# Patient Record
Sex: Male | Born: 1945
Health system: Southern US, Community
[De-identification: ages and names within clinical notes are randomized; demographics above are authoritative.]

## PROBLEM LIST (undated history)

## (undated) DIAGNOSIS — E785 Hyperlipidemia, unspecified: Secondary | ICD-10-CM

## (undated) DIAGNOSIS — Z8619 Personal history of other infectious and parasitic diseases: Secondary | ICD-10-CM

## (undated) DIAGNOSIS — I10 Essential (primary) hypertension: Secondary | ICD-10-CM

## (undated) HISTORY — PX: OTHER SURGICAL HISTORY: SHX169

## (undated) HISTORY — DX: Essential (primary) hypertension: I10

## (undated) HISTORY — DX: Hyperlipidemia, unspecified: E78.5

## (undated) HISTORY — DX: Personal history of other infectious and parasitic diseases: Z86.19

---

## 1993-05-21 HISTORY — PX: ANGIOPLASTY: SHX39

## 2005-02-19 HISTORY — PX: CATARACT EXTRACTION: SUR2

## 2013-06-15 LAB — BASIC METABOLIC PANEL
BUN: 15 mg/dL (ref 4–21)
Creatinine: 1.2 mg/dL (ref 0.6–1.3)
Glucose: 93 mg/dL
Potassium: 4.9 mmol/L (ref 3.4–5.3)
Sodium: 141 mmol/L (ref 137–147)

## 2013-06-15 LAB — LIPID PANEL
CHOLESTEROL: 160 mg/dL (ref 0–200)
HDL: 35 mg/dL (ref 35–70)
LDL CALC: 100 mg/dL
Triglycerides: 125 mg/dL (ref 40–160)

## 2013-06-15 LAB — PSA: PSA: 0.7

## 2014-09-16 DIAGNOSIS — I10 Essential (primary) hypertension: Secondary | ICD-10-CM | POA: Insufficient documentation

## 2014-09-16 DIAGNOSIS — I2510 Atherosclerotic heart disease of native coronary artery without angina pectoris: Secondary | ICD-10-CM | POA: Insufficient documentation

## 2014-09-16 DIAGNOSIS — E781 Pure hyperglyceridemia: Secondary | ICD-10-CM | POA: Insufficient documentation

## 2014-09-16 DIAGNOSIS — Z87891 Personal history of nicotine dependence: Secondary | ICD-10-CM | POA: Insufficient documentation

## 2014-09-16 DIAGNOSIS — R12 Heartburn: Secondary | ICD-10-CM | POA: Insufficient documentation

## 2014-09-17 ENCOUNTER — Encounter: Payer: Self-pay | Admitting: Family Medicine

## 2014-09-17 ENCOUNTER — Ambulatory Visit (INDEPENDENT_AMBULATORY_CARE_PROVIDER_SITE_OTHER): Payer: Medicare Other | Admitting: Family Medicine

## 2014-09-17 VITALS — BP 120/80 | HR 72 | Temp 99.1°F | Resp 16 | Ht 65.5 in | Wt 191.0 lb

## 2014-09-17 DIAGNOSIS — Z87891 Personal history of nicotine dependence: Secondary | ICD-10-CM

## 2014-09-17 DIAGNOSIS — E781 Pure hyperglyceridemia: Secondary | ICD-10-CM

## 2014-09-17 DIAGNOSIS — Z125 Encounter for screening for malignant neoplasm of prostate: Secondary | ICD-10-CM | POA: Diagnosis not present

## 2014-09-17 DIAGNOSIS — Z0000 Encounter for general adult medical examination without abnormal findings: Secondary | ICD-10-CM | POA: Diagnosis not present

## 2014-09-17 DIAGNOSIS — Z1211 Encounter for screening for malignant neoplasm of colon: Secondary | ICD-10-CM | POA: Diagnosis not present

## 2014-09-17 LAB — IFOBT (OCCULT BLOOD): IFOBT: NEGATIVE

## 2014-09-17 NOTE — Progress Notes (Signed)
Patient: Drew Russell, Male    DOB: October 23, 1945, 69 y.o.   MRN: [id number] Visit Date: 09/19/2014  Today's Provider: Mila Merry, MD   Chief Complaint  Patient presents with  . Annual Exam  . Hypertension   Subjective:    Annual wellness visit Drew Russell is a 69 y.o. male who presents today for his Subsequent Annual Wellness Visit. He feels well. He reports exercising yes. He reports he is sleeping well.  -----------------------------------------------------------  Hypertension, follow-up:  BP Readings from Last 3 Encounters:  09/17/14 120/80  06/12/13 156/70    He was last seen for hypertension 3 months ago.  BP at that visit was 156/70. Management changes since that visit include recommended decreasing sodium in diet and exersing 30 minutes 3 days a week. He reports good compliance with treatment. He is not having side effects. none  He is not exercising. He is not adherent to low salt diet.   Outside blood pressures are N/A. He is experiencing none.  Patient denies none.   Use of agents associated with hypertension: none.     Weight trend: increasing steadily Wt Readings from Last 3 Encounters:  09/17/14 191 lb (86.637 kg)  06/12/13 191 lb (86.637 kg)    Current diet: in general, an "unhealthy" diet  ------------------------------------------------------------------------  Follow-up for hypertriglyceridemia from 06/12/2013: recommended take OTC fish oil 4,000 mg qd and also take coated aspirin 81 mg qd.  Review of Systems  Constitutional: Negative.   HENT: Positive for tinnitus.   Eyes: Negative.   Respiratory: Negative.   Cardiovascular: Negative.   Gastrointestinal: Positive for constipation.  Endocrine: Negative.   Genitourinary: Negative.   Musculoskeletal: Negative.   Skin: Negative.   Allergic/Immunologic: Negative.   Neurological: Negative.   Hematological: Negative.   Psychiatric/Behavioral: Negative.     History   Social  History  . Marital Status: Single    Spouse Name: N/A  . Number of Children: N/A  . Years of Education: 8th grade   Occupational History  . Retired    Social History Main Topics  . Smoking status: Former Smoker -- 1.00 packs/day for 20 years    Types: Cigarettes  . Smokeless tobacco: Former Neurosurgeon    Quit date: 01/01/2014  . Alcohol Use: No     Comment: Previous heavy drinker  . Drug Use: No  . Sexual Activity: Not on file   Other Topics Concern  . Not on file   Social History Narrative    Patient Active Problem List   Diagnosis Date Noted  . Coronary artery disease 09/16/2014  . Essential (primary) hypertension 09/16/2014  . Heartburn 09/16/2014  . Hypertriglyceridemia 09/16/2014  . History of tobacco abuse 09/16/2014    Past Surgical History  Procedure Laterality Date  . Cataract extraction Bilateral 2007  . Angioplasty  05/21/1993    [facility]  . Cardiac catherterization      per pt had angioplast in 1995, no stent    His family history includes Colon cancer in his sister; Diabetes in his sister and sister; Heart attack in his father; Heart disease in his father; Stroke in his mother.    Previous Medications   OMEGA-3 FATTY ACIDS (FISH OIL) 1000 MG CAPS    Take 2 tablets by mouth daily.    Patient Care Team: Malva Limes, MD as PCP - General (Family Medicine)     Objective:   Vitals: BP 120/80 mmHg  Pulse 72  Temp(Src) 99.1 F (37.3 C) (Oral)  Resp 16  Ht 5' 5.5" (1.664 m)  Wt 191 lb (86.637 kg)  BMI 31.29 kg/m2  SpO2 97%  Physical Exam   General Appearance:    Alert, cooperative, no distress, appears stated age, obese  Head:    Normocephalic, without obvious abnormality, atraumatic  Eyes:    PERRL, conjunctiva/corneas clear, EOM's intact, fundi    benign, both eyes       Ears:    Normal TM's and external ear canals, both ears  Nose:   Nares normal, septum midline, mucosa normal, no drainage   or sinus tenderness    Throat:   Lips, mucosa, and tongue normal; teeth and gums normal  Neck:   Supple, symmetrical, trachea midline, no adenopathy;       thyroid:  No enlargement/tenderness/nodules; no carotid   bruit or JVD  Back:     Symmetric, no curvature, ROM normal, no CVA tenderness  Lungs:     Clear to auscultation bilaterally, respirations unlabored  Chest wall:    No tenderness or deformity  Heart:    Regular rate and rhythm, S1 and S2 normal, no murmur, rub   or gallop  Abdomen:     Soft, non-tender, bowel sounds active all four quadrants,    no masses, no organomegaly  Genitalia:    deferred  Rectal:    normal tone, normal prostate, no masses or tenderness and soft brown guaiac negative stool noted  Extremities:   Extremities normal, atraumatic, no cyanosis or edema  Pulses:   2+ and symmetric all extremities  Skin:   Skin color, texture, turgor normal, no rashes or lesions  Lymph nodes:   Cervical, supraclavicular, and axillary nodes normal  Neurologic:   CNII-XII intact. Normal strength, sensation and reflexes      throughout    Activities of Daily Living In your present state of health, do you have any difficulty performing the following activities: 09/17/2014  Hearing? Y  Vision? N  Difficulty concentrating or making decisions? N  Walking or climbing stairs? N  Dressing or bathing? N  Doing errands, shopping? Y    Fall Risk Assessment Fall Risk  09/17/2014  Falls in the past year? No     Depression Screen PHQ 2/9 Scores 09/17/2014  PHQ - 2 Score 0    Cognitive Testing - 6-CIT  Correct? Score   What year is it? yes 0 0 or 4  What month is it? yes 0 0 or 3  Memorize:    Floyde Parkins,  40,  [address],  [place],      What time is it? (within 1 hour) yes 0 0 or 3  Count backwards from 20 yes 0 0, 2, or 4  Name the months of the year yes 0 0, 2, or 4  Repeat name & address above yes 4 0, 2, 4, 6, 8, or 10       TOTAL SCORE  4/28   Interpretation:  Normal  Normal (0-7) Abnormal  (8-28)       Assessment & Plan:     Annual Wellness Visit  Reviewed patient's Family Medical History Reviewed and updated list of patient's medical providers Assessment of cognitive impairment was done Assessed patient's functional ability Established a written schedule for health screening services Health Risk Assessent Completed and Reviewed  Exercise Activities and Dietary recommendations Goals    None      Immunization History  Administered Date(s) Administered  .  Pneumococcal Conjugate-13 06/12/2013  . Pneumococcal Polysaccharide-23 04/09/2012    Health Maintenance  Topic Date Due  . COLONOSCOPY  10/28/1993  . ZOSTAVAX  10/29/2003  . TETANUS/TDAP  09/17/2015 (Originally 10/29/1962)  . INFLUENZA VACCINE  09/20/2014  . PNA vac Low Risk Adult  Completed      Discussed health benefits of physical activity, and encouraged him to engage in regular exercise appropriate for his age and condition.    ------------------------------------------------------------------------------------------------------------ 1. History of tobacco abuse Congratulated on smoking cessation.   2. Prostate cancer screening  - PSA  3. Colon cancer screening  - IFOBT POC (occult bld, rslt in office)  4. Annual physical exam   5. Hypertriglyceridemia  - Lipid panel - Glucose

## 2014-09-21 LAB — LIPID PANEL
Chol/HDL Ratio: 4.5 ratio units (ref 0.0–5.0)
Cholesterol, Total: 140 mg/dL (ref 100–199)
HDL: 31 mg/dL — AB (ref >39)
LDL Calculated: 87 mg/dL (ref 0–99)
Triglycerides: 110 mg/dL (ref 0–149)
VLDL Cholesterol Cal: 22 mg/dL (ref 5–40)

## 2014-09-21 LAB — PSA: Prostate Specific Ag, Serum: 0.6 ng/mL (ref 0.0–4.0)

## 2014-09-21 LAB — GLUCOSE, RANDOM: Glucose: 93 mg/dL (ref 65–99)

## 2015-12-08 ENCOUNTER — Telehealth: Payer: Self-pay | Admitting: Family Medicine

## 2015-12-08 NOTE — Telephone Encounter (Signed)
Called Pt to schedule AWV with [facility] for Nov - knb °

## 2015-12-08 NOTE — Telephone Encounter (Signed)
Pt returned call. I advised about scheduling AWV. Pt stated that he has a lot going on right now and would rather be called back after the first of the year to schedule the visit. Thanks TNP

## 2016-04-18 ENCOUNTER — Ambulatory Visit (INDEPENDENT_AMBULATORY_CARE_PROVIDER_SITE_OTHER): Payer: Medicare Other | Admitting: Family Medicine

## 2016-04-18 ENCOUNTER — Ambulatory Visit (INDEPENDENT_AMBULATORY_CARE_PROVIDER_SITE_OTHER): Payer: Medicare Other

## 2016-04-18 ENCOUNTER — Encounter: Payer: Self-pay | Admitting: Family Medicine

## 2016-04-18 VITALS — BP 150/70 | HR 72 | Temp 98.7°F | Ht 66.0 in | Wt 194.0 lb

## 2016-04-18 VITALS — BP 140/86

## 2016-04-18 DIAGNOSIS — I10 Essential (primary) hypertension: Secondary | ICD-10-CM | POA: Diagnosis not present

## 2016-04-18 DIAGNOSIS — Z1159 Encounter for screening for other viral diseases: Secondary | ICD-10-CM

## 2016-04-18 DIAGNOSIS — Z0000 Encounter for general adult medical examination without abnormal findings: Secondary | ICD-10-CM

## 2016-04-18 DIAGNOSIS — Z125 Encounter for screening for malignant neoplasm of prostate: Secondary | ICD-10-CM

## 2016-04-18 DIAGNOSIS — Z23 Encounter for immunization: Secondary | ICD-10-CM

## 2016-04-18 DIAGNOSIS — Z87891 Personal history of nicotine dependence: Secondary | ICD-10-CM | POA: Diagnosis not present

## 2016-04-18 DIAGNOSIS — Z1211 Encounter for screening for malignant neoplasm of colon: Secondary | ICD-10-CM

## 2016-04-18 NOTE — Patient Instructions (Signed)
   Recommend taking 81mg enteric coated aspirin to reduce risk of vascular events such as heart attacks and strokes.    

## 2016-04-18 NOTE — Progress Notes (Signed)
Patient: Drew Russell, Male    DOB: 08/30/1945, 71 y.o.   MRN: [id number] Visit Date: 04/18/2016  Today's Provider: Mila Merryonald Arne Schlender, MD   Chief Complaint  Patient presents with  . Annual Exam  . Hypertension    follow up  . Hyperlipidemia    follow up   Subjective:    Annual physical exam Drew IshikawaHoward D Russell is a 71 y.o. male who presents today for health maintenance and complete physical. He feels well. He reports never exercising. He reports he is sleeping fairly well.  -----------------------------------------------------------------  Hypertension, follow-up:  BP Readings from Last 3 Encounters:  04/18/16 (!) 150/70  09/17/14 120/80  06/12/13 (!) 156/70    He was last seen for hypertension 2 years ago.  BP at that visit was 156/70. Management since that visit includes advising patient to decrease sodium in his diet and exercise regularly. He reports good compliance with treatment. He is not having side effects.  He is not exercising. He is not adherent to low salt diet.   Outside blood pressures are not being checked. He is experiencing none.  Patient denies chest pain, chest pressure/discomfort, claudication, dyspnea, exertional chest pressure/discomfort, fatigue, irregular heart beat, lower extremity edema, near-syncope, orthopnea, palpitations, paroxysmal nocturnal dyspnea, syncope and tachypnea.   Cardiovascular risk factors include advanced age (older than 4855 for men, 3065 for women), dyslipidemia, hypertension and male gender.  Use of agents associated with hypertension: none.     Weight trend: fluctuating a bit Wt Readings from Last 3 Encounters:  04/18/16 194 lb (88 kg)  09/17/14 191 lb (86.6 kg)  06/12/13 191 lb (86.6 kg)    Current diet: in general, an "unhealthy" diet  ------------------------------------------------------------------------  Lipid/Cholesterol, Follow-up:   Last seen for this 2 years ago.  Management changes since that visit  include no changes. . Last Lipid Panel:    Component Value Date/Time   CHOL 140 09/20/2014 1225   TRIG 110 09/20/2014 1225   HDL 31 (L) 09/20/2014 1225   CHOLHDL 4.5 09/20/2014 1225   LDLCALC 87 09/20/2014 1225    Risk factors for vascular disease include hypercholesterolemia and hypertension  He reports good compliance with treatment. He is not having side effects.  Current symptoms include none and have been stable. Weight trend: fluctuating a bit Prior visit with dietician: no Current diet: in general, an "unhealthy" diet Current exercise: none  Wt Readings from Last 3 Encounters:  04/18/16 194 lb (88 kg)  09/17/14 191 lb (86.6 kg)  06/12/13 191 lb (86.6 kg)    -------------------------------------------------------------------   Review of Systems  Constitutional: Negative for appetite change, chills, fatigue and fever.  HENT: Negative for congestion, ear pain, hearing loss, nosebleeds and trouble swallowing.   Eyes: Negative for pain and visual disturbance.  Respiratory: Negative for cough, chest tightness and shortness of breath.   Cardiovascular: Negative for chest pain, palpitations and leg swelling.  Gastrointestinal: Negative for abdominal pain, blood in stool, constipation, diarrhea, nausea and vomiting.  Endocrine: Negative for polydipsia, polyphagia and polyuria.  Genitourinary: Negative for dysuria and flank pain.  Musculoskeletal: Negative for arthralgias, back pain, joint swelling, myalgias and neck stiffness.  Skin: Negative for color change, rash and wound.  Neurological: Negative for dizziness, tremors, seizures, speech difficulty, weakness, light-headedness and headaches.  Psychiatric/Behavioral: Negative for behavioral problems, confusion, decreased concentration, dysphoric mood and sleep disturbance. The patient is not nervous/anxious.   All other systems reviewed and are negative.   Social History  He  reports that he quit smoking about 2  years ago. His smoking use included Cigarettes. He has a 20.00 pack-year smoking history. He has never used smokeless tobacco. He reports that he does not drink alcohol or use drugs.       Social History   Social History  . Marital status: Single    Spouse name: N/A  . Number of children: N/A  . Years of education: 8th grade   Occupational History  . Retired    Social History Main Topics  . Smoking status: Former Smoker    Packs/day: 1.00    Years: 20.00    Types: Cigarettes    Quit date: 01/01/2014  . Smokeless tobacco: Never Used  . Alcohol use No     Comment: Previous heavy drinker  . Drug use: No  . Sexual activity: Not on file   Other Topics Concern  . Not on file   Social History Narrative  . No narrative on file    Past Medical History:  Diagnosis Date  . History of chicken pox   . History of measles as a child   . History of mumps as a child      Patient Active Problem List   Diagnosis Date Noted  . Coronary artery disease 09/16/2014  . Essential (primary) hypertension 09/16/2014  . Heartburn 09/16/2014  . Hypertriglyceridemia 09/16/2014  . History of tobacco abuse 09/16/2014    Past Surgical History:  Procedure Laterality Date  . ANGIOPLASTY  05/21/1993   [facility]  . Cardiac Catherterization     per pt had angioplast in 1995, no stent  . CATARACT EXTRACTION Bilateral 2007    Family History        Family Status  Relation Status  . Mother Deceased at age 33   OLD AGE  . Father Deceased at age 28   MI  . Sister Alive  . Sister Alive        His family history includes Colon cancer in his sister; Diabetes in his sister and sister; Heart attack in his father; Heart disease in his father; Stroke in his mother.     No Known Allergies   Current Outpatient Prescriptions:  .  Omega-3 Fatty Acids (FISH OIL) 1000 MG CAPS, Take 2 tablets by mouth daily., Disp: , Rfl:    Patient Care Team: Malva Limes, MD as PCP -  General (Family Medicine)      Objective:    Vital Signs - Last Recorded  Most recent update: 04/18/2016 2:45 PM by Hyacinth Meeker, LPN  BP    161/09 (BP Location: Right Arm)     Pulse  72     Temp  98.7 F (37.1 C) (Oral)     Ht  5\' 6"  (1.676 m)     Wt  194 lb (88 kg)      BMI  31.31 kg/m        Physical Exam   General Appearance:    Alert, cooperative, no distress, appears stated age  Head:    Normocephalic, without obvious abnormality, atraumatic  Eyes:    PERRL, conjunctiva/corneas clear, EOM's intact, fundi    benign, both eyes       Ears:    Normal TM's and external ear canals, both ears  Nose:   Nares normal, septum midline, mucosa normal, no drainage   or sinus tenderness  Throat:   Lips, mucosa, and tongue normal; teeth and gums normal  Neck:  Supple, symmetrical, trachea midline, no adenopathy;       thyroid:  No enlargement/tenderness/nodules; no carotid   bruit or JVD  Back:     Symmetric, no curvature, ROM normal, no CVA tenderness  Lungs:     Clear to auscultation bilaterally, respirations unlabored  Chest wall:    No tenderness or deformity  Heart:    Regular rate and rhythm, S1 and S2 normal, no murmur, rub   or gallop  Abdomen:     Soft, non-tender, bowel sounds active all four quadrants,    no masses, no organomegaly  Genitalia:    deferred  Rectal:    deferred  Extremities:   Extremities normal, atraumatic, no cyanosis or edema  Pulses:   2+ and symmetric all extremities  Skin:   Skin color, texture, turgor normal, no rashes or lesions  Lymph nodes:   Cervical, supraclavicular, and axillary nodes normal  Neurologic:   CNII-XII intact. Normal strength, sensation and reflexes      throughout      Depression Screen PHQ 2/9 Scores 04/18/2016 04/18/2016 09/17/2014  PHQ - 2 Score 0 1 0  PHQ- 9 Score 0 - -      Assessment & Plan:     Routine Health Maintenance and Physical Exam  Exercise Activities and Dietary  recommendations Goals    . Increase water intake          Starting 04/18/16, I will increase my water intake to 4 glasses a day.       Immunization History  Administered Date(s) Administered  . Pneumococcal Conjugate-13 06/12/2013  . Pneumococcal Polysaccharide-23 04/09/2012    Health Maintenance  Topic Date Due  . Hepatitis C Screening  02/19/2017 (Originally March 17, 1945)  . COLONOSCOPY  02/19/2026 (Originally 10/28/1993)  . TETANUS/TDAP  02/19/2026 (Originally 10/29/1962)  . INFLUENZA VACCINE  Completed  . PNA vac Low Risk Adult  Completed     Discussed health benefits of physical activity, and encouraged him to engage in regular exercise appropriate for his age and condition.    --------------------------------------------------------------------  1. Annual physical exam Patient Instructions  Recommend taking 81mg  enteric coated aspirin to reduce risk of vascular events such as heart attacks and strokes.    2. Need for hepatitis C screening test  - Hepatitis C antibody  3. Colon cancer screening Will sent with iFOBT  4. Prostate cancer screening  - PSA  5. Essential (primary) hypertension Well controlled.  Continue current medications.   - Lipid panel - Comprehensive metabolic panel  6. History of smoking 30 or more pack years  - CT CHEST LUNG CA SCREEN LOW DOSE W/O CM; Future   Mila Merry, MD  [facility]

## 2016-04-18 NOTE — Patient Instructions (Signed)
 Health Maintenance, Male A healthy lifestyle and preventive care is important for your health and wellness. Ask your health care provider about what schedule of regular examinations is right for you. What should I know about weight and diet?  Eat a Healthy Diet  Eat plenty of vegetables, fruits, whole grains, low-fat dairy products, and lean protein.  Do not eat a lot of foods high in solid fats, added sugars, or salt. Maintain a Healthy Weight  Regular exercise can help you achieve or maintain a healthy weight. You should:  Do at least 150 minutes of exercise each week. The exercise should increase your heart rate and make you sweat (moderate-intensity exercise).  Do strength-training exercises at least twice a week. Watch Your Levels of Cholesterol and Blood Lipids  Have your blood tested for lipids and cholesterol every 5 years starting at 73 years of age. If you are at high risk for heart disease, you should start having your blood tested when you are 73 years old. You may need to have your cholesterol levels checked more often if:  Your lipid or cholesterol levels are high.  You are older than 73 years of age.  You are at high risk for heart disease. What should I know about cancer screening? Many types of cancers can be detected early and may often be prevented. Lung Cancer  You should be screened every year for lung cancer if:  You are a current smoker who has smoked for at least 30 years.  You are a former smoker who has quit within the past 15 years.  Talk to your health care provider about your screening options, when you should start screening, and how often you should be screened. Colorectal Cancer  Routine colorectal cancer screening usually begins at 73 years of age and should be repeated every 5-10 years until you are 73 years old. You may need to be screened more often if early forms of precancerous polyps or small growths are found. Your health care provider  may recommend screening at an earlier age if you have risk factors for colon cancer.  Your health care provider may recommend using home test kits to check for hidden blood in the stool.  A small camera at the end of a tube can be used to examine your colon (sigmoidoscopy or colonoscopy). This checks for the earliest forms of colorectal cancer. Prostate and Testicular Cancer  Depending on your age and overall health, your health care provider may do certain tests to screen for prostate and testicular cancer.  Talk to your health care provider about any symptoms or concerns you have about testicular or prostate cancer. Skin Cancer  Check your skin from head to toe regularly.  Tell your health care provider about any new moles or changes in moles, especially if:  There is a change in a mole's size, shape, or color.  You have a mole that is larger than a pencil eraser.  Always use sunscreen. Apply sunscreen liberally and repeat throughout the day.  Protect yourself by wearing long sleeves, pants, a wide-brimmed hat, and sunglasses when outside. What should I know about heart disease, diabetes, and high blood pressure?  If you are 18-39 years of age, have your blood pressure checked every 3-5 years. If you are 40 years of age or older, have your blood pressure checked every year. You should have your blood pressure measured twice-once when you are at a hospital or clinic, and once when you are not at   a hospital or clinic. Record the average of the two measurements. To check your blood pressure when you are not at a hospital or clinic, you can use:  An automated blood pressure machine at a pharmacy.  A home blood pressure monitor.  Talk to your health care provider about your target blood pressure.  If you are between 45-79 years old, ask your health care provider if you should take aspirin to prevent heart disease.  Have regular diabetes screenings by checking your fasting blood sugar  level.  If you are at a normal weight and have a low risk for diabetes, have this test once every three years after the age of 45.  If you are overweight and have a high risk for diabetes, consider being tested at a younger age or more often.  A one-time screening for abdominal aortic aneurysm (AAA) by ultrasound is recommended for men aged 65-75 years who are current or former smokers. What should I know about preventing infection? Hepatitis B  If you have a higher risk for hepatitis B, you should be screened for this virus. Talk with your health care provider to find out if you are at risk for hepatitis B infection. Hepatitis C  Blood testing is recommended for:  Everyone born from 1945 through 1965.  Anyone with known risk factors for hepatitis C. Sexually Transmitted Diseases (STDs)  You should be screened each year for STDs including gonorrhea and chlamydia if:  You are sexually active and are younger than 73 years of age.  You are older than 73 years of age and your health care provider tells you that you are at risk for this type of infection.  Your sexual activity has changed since you were last screened and you are at an increased risk for chlamydia or gonorrhea. Ask your health care provider if you are at risk.  Talk with your health care provider about whether you are at high risk of being infected with HIV. Your health care provider may recommend a prescription medicine to help prevent HIV infection. What else can I do?  Schedule regular health, dental, and eye exams.  Stay current with your vaccines (immunizations).  Do not use any tobacco products, such as cigarettes, chewing tobacco, and e-cigarettes. If you need help quitting, ask your health care provider.  Limit alcohol intake to no more than 2 drinks per day. One drink equals 12 ounces of beer, 5 ounces of wine, or 1 ounces of hard liquor.  Do not use street drugs.  Do not share needles.  Ask your health  care provider for help if you need support or information about quitting drugs.  Tell your health care provider if you often feel depressed.  Tell your health care provider if you have ever been abused or do not feel safe at home. This information is not intended to replace advice given to you by your health care provider. Make sure you discuss any questions you have with your health care provider. Document Released: 08/04/2007 Document Revised: 10/05/2015 Document Reviewed: 11/09/2014 Elsevier Interactive Patient Education  2017 [employer]  

## 2016-04-18 NOTE — Progress Notes (Signed)
Subjective:   Drew Russell is a 71 y.o. male who presents for Medicare Annual/Subsequent preventive examination.  Review of Systems:  N/A  Cardiac Risk Factors include: advanced age (>100men, >60 women);hypertension;male gender;obesity (BMI >30kg/m2)     Objective:    Vitals: BP (!) 150/70 (BP Location: Right Arm)   Pulse 72   Temp 98.7 F (37.1 C) (Oral)   Ht 5\' 6"  (1.676 m)   Wt 194 lb (88 kg)   BMI 31.31 kg/m   Body mass index is 31.31 kg/m.  Tobacco History  Smoking Status  . Former Smoker  . Packs/day: 1.00  . Years: 20.00  . Types: Cigarettes  . Quit date: 01/01/2014  Smokeless Tobacco  . Never Used     Counseling given: Not Answered   Past Medical History:  Diagnosis Date  . History of chicken pox   . History of measles as a child   . History of mumps as a child    Past Surgical History:  Procedure Laterality Date  . ANGIOPLASTY  05/21/1993   [facility]  . Cardiac Catherterization     per pt had angioplast in 1995, no stent  . CATARACT EXTRACTION Bilateral 2007   Family History  Problem Relation Age of Onset  . Stroke Mother   . Heart disease Father   . Heart attack Father   . Colon cancer Sister   . Diabetes Sister     type 2  . Diabetes Sister     type 2   History  Sexual Activity  . Sexual activity: Not on file    Outpatient Encounter Prescriptions as of 04/18/2016  Medication Sig  . Omega-3 Fatty Acids (FISH OIL) 1000 MG CAPS Take 2 tablets by mouth daily.   No facility-administered encounter medications on file as of 04/18/2016.     Activities of Daily Living In your present state of health, do you have any difficulty performing the following activities: 04/18/2016  Hearing? N  Vision? N  Difficulty concentrating or making decisions? N  Walking or climbing stairs? N  Dressing or bathing? N  Doing errands, shopping? Y  Preparing Food and eating ? N  Using the Toilet? N  In the past six months, have you  accidently leaked urine? N  Do you have problems with loss of bowel control? N  Managing your Medications? N  Managing your Finances? N  Housekeeping or managing your Housekeeping? N  Some recent data might be hidden    Patient Care Team: Malva Limes, MD as PCP - General (Family Medicine)   Assessment:     Exercise Activities and Dietary recommendations Current Exercise Habits: The patient does not participate in regular exercise at present, Exercise limited by: None identified  Goals    . Increase water intake          Starting 04/18/16, I will increase my water intake to 4 glasses a day.      Fall Risk Fall Risk  04/18/2016 09/17/2014  Falls in the past year? No No   Depression Screen PHQ 2/9 Scores 04/18/2016 09/17/2014  PHQ - 2 Score 1 0    Cognitive Function     6CIT Screen 04/18/2016  What Year? 0 points  What month? 0 points  What time? 0 points  Count back from 20 0 points  Months in reverse 0 points  Repeat phrase 6 points  Total Score 6    Immunization History  Administered Date(s) Administered  .  Influenza, High Dose Seasonal PF 04/18/2016  . Pneumococcal Conjugate-13 06/12/2013  . Pneumococcal Polysaccharide-23 04/09/2012   Screening Tests Health Maintenance  Topic Date Due  . Hepatitis C Screening  02/19/2017 (Originally 03/19/1945)  . COLONOSCOPY  02/19/2026 (Originally 10/28/1993)  . TETANUS/TDAP  02/19/2026 (Originally [birth year]29/1964)  . INFLUENZA VACCINE  Completed  . PNA vac Low Risk Adult  Completed      Plan:  I have personally reviewed and addressed the Medicare Annual Wellness questionnaire and have noted the following in the patient's chart:  A. Medical and social history B. Use of alcohol, tobacco or illicit drugs  C. Current medications and supplements D. Functional ability and status E.  Nutritional status F.  Physical activity G. Advance directives H. List of other physicians I.  Hospitalizations, surgeries, and ER visits in  previous 12 months J.  Vitals K. Screenings such as hearing and vision if needed, cognitive and depression L. Referrals and appointments - none  In addition, I have reviewed and discussed with patient certain preventive protocols, quality metrics, and best practice recommendations. A written personalized care plan for preventive services as well as general preventive health recommendations were provided to patient.  See attached scanned questionnaire for additional information.   Signed,  Hyacinth MeekerMckenzie Aydden Cumpian, LPN Nurse Health Advisor   MD Recommendations: None. Pt declined Hepatitis C screening, tetanus vaccine and colonoscopy.   I have reviewed the health advisor's note, was available for consultation, and agree with documentation and plan  Mila Merryonald Fisher, MD

## 2016-04-19 ENCOUNTER — Telehealth: Payer: Self-pay | Admitting: *Deleted

## 2016-04-19 DIAGNOSIS — Z87891 Personal history of nicotine dependence: Secondary | ICD-10-CM

## 2016-04-19 LAB — COMPREHENSIVE METABOLIC PANEL
ALBUMIN: 4.8 g/dL (ref 3.5–4.8)
ALK PHOS: 75 IU/L (ref 39–117)
ALT: 19 IU/L (ref 0–44)
AST: 21 IU/L (ref 0–40)
Albumin/Globulin Ratio: 1.8 (ref 1.2–2.2)
BILIRUBIN TOTAL: 1.1 mg/dL (ref 0.0–1.2)
BUN / CREAT RATIO: 14 (ref 10–24)
BUN: 15 mg/dL (ref 8–27)
CHLORIDE: 102 mmol/L (ref 96–106)
CO2: 27 mmol/L (ref 18–29)
Calcium: 9.7 mg/dL (ref 8.6–10.2)
Creatinine, Ser: 1.09 mg/dL (ref 0.76–1.27)
GFR calc Af Amer: 78 mL/min/{1.73_m2} (ref >59)
GFR calc non Af Amer: 67 mL/min/{1.73_m2} (ref >59)
GLOBULIN, TOTAL: 2.6 g/dL (ref 1.5–4.5)
Glucose: 95 mg/dL (ref 65–99)
POTASSIUM: 4.8 mmol/L (ref 3.5–5.2)
SODIUM: 145 mmol/L — AB (ref 134–144)
Total Protein: 7.4 g/dL (ref 6.0–8.5)

## 2016-04-19 LAB — PSA: Prostate Specific Ag, Serum: 0.7 ng/mL (ref 0.0–4.0)

## 2016-04-19 LAB — LIPID PANEL
CHOL/HDL RATIO: 3.9 ratio (ref 0.0–5.0)
CHOLESTEROL TOTAL: 156 mg/dL (ref 100–199)
HDL: 40 mg/dL (ref >39)
LDL CALC: 99 mg/dL (ref 0–99)
Triglycerides: 85 mg/dL (ref 0–149)
VLDL CHOLESTEROL CAL: 17 mg/dL (ref 5–40)

## 2016-04-19 LAB — HEPATITIS C ANTIBODY: Hep C Virus Ab: <0.1 s/co ratio (ref 0.0–0.9)

## 2016-04-19 NOTE — Telephone Encounter (Signed)
Received referral for initial lung cancer screening scan. Contacted patient and obtained smoking history,(former, quit 12/22/13, 50 pack year) as well as answering questions related to screening process. Patient denies signs of lung cancer such as weight loss or hemoptysis. Patient denies comorbidity that would prevent curative treatment if lung cancer were found. Patient is tentatively scheduled for shared decision making visit and CT scan on 04/24/16, pending insurance approval from business office.

## 2016-04-19 NOTE — Telephone Encounter (Signed)
Received referral for low dose lung cancer screening CT scan. Voicemail left at phone number listed in EMR for patient to call me back to facilitate scheduling scan.  

## 2016-04-20 ENCOUNTER — Telehealth: Payer: Self-pay

## 2016-04-20 NOTE — Telephone Encounter (Signed)
Notes Recorded by Malva Limesonald E Fisher, MD on 04/19/2016 at 4:42 PM EST Cholesterol is up somewhat. Otherwise labs normal. Needs to take daily coated aspirin. I forgot to give him OC-Lyte for colon cancer screening when he was here the other day. Please have him stop by to pick one up in the next couple of days. Thanks.

## 2016-04-20 NOTE — Telephone Encounter (Signed)
-----   Message from Adline PealsElena D Desanto, CMA sent at 04/20/2016  9:19 AM EST ----- [facility]  ED

## 2016-04-20 NOTE — Telephone Encounter (Signed)
Pt advised.   Thanks,   -Priscille Shadduck  

## 2016-04-20 NOTE — Telephone Encounter (Signed)
-----   Message from Elena D Desanto, CMA sent at 04/20/2016  9:19 AM EST ----- [facility]  ED 

## 2016-04-23 ENCOUNTER — Other Ambulatory Visit: Payer: Self-pay | Admitting: *Deleted

## 2016-04-23 DIAGNOSIS — Z87891 Personal history of nicotine dependence: Secondary | ICD-10-CM

## 2016-04-24 ENCOUNTER — Inpatient Hospital Stay: Payer: Medicare Other | Attending: Oncology | Admitting: Oncology

## 2016-04-24 ENCOUNTER — Other Ambulatory Visit: Payer: Self-pay | Admitting: *Deleted

## 2016-04-24 ENCOUNTER — Ambulatory Visit
Admission: RE | Admit: 2016-04-24 | Discharge: 2016-04-24 | Disposition: A | Discharge status: Home / Self Care | Payer: Medicare Other | Source: Ambulatory Visit | Attending: Oncology | Admitting: Oncology

## 2016-04-24 ENCOUNTER — Ambulatory Visit: Payer: Medicare Other

## 2016-04-24 DIAGNOSIS — Z87891 Personal history of nicotine dependence: Secondary | ICD-10-CM | POA: Diagnosis not present

## 2016-04-24 DIAGNOSIS — I2510 Atherosclerotic heart disease of native coronary artery without angina pectoris: Secondary | ICD-10-CM | POA: Diagnosis not present

## 2016-04-24 DIAGNOSIS — Z122 Encounter for screening for malignant neoplasm of respiratory organs: Secondary | ICD-10-CM | POA: Diagnosis not present

## 2016-04-24 DIAGNOSIS — K229 Disease of esophagus, unspecified: Secondary | ICD-10-CM | POA: Diagnosis not present

## 2016-04-24 DIAGNOSIS — I700 Atherosclerosis of aorta: Secondary | ICD-10-CM | POA: Insufficient documentation

## 2016-04-24 NOTE — Progress Notes (Signed)
Personal history of tobacco use, presenting hazards to health In accordance with CMS guidelines, patient has met eligibility criteria including age, absence of signs or symptoms of lung cancer.  Social History  Substance Use Topics  . Smoking status: Former Smoker    Packs/day: 1.00    Years: 50.00    Types: Cigarettes    Quit date: 01/01/2014  . Smokeless tobacco: Never Used     Comment: smoked 1ppd 50 years. Quit 2015  . Alcohol use No     Comment: Previous heavy drinker     A shared decision-making session was conducted prior to the performance of CT scan. This includes one or more decision aids, includes benefits and harms of screening, follow-up diagnostic testing, over-diagnosis, false positive rate, and total radiation exposure.  Counseling on the importance of adherence to annual lung cancer LDCT screening, impact of co-morbidities, and ability or willingness to undergo diagnosis and treatment is imperative for compliance of the program.  Counseling on the importance of continued smoking cessation for former smokers; the importance of smoking cessation for current smokers, and information about tobacco cessation interventions have been given to patient including [place] and 1800 quit [place] programs.  Written order for lung cancer screening with LDCT has been given to the patient and any and all questions have been answered to the best of my abilities.   Yearly follow up will be coordinated by Burgess Estelle, Thoracic Navigator.   Lucendia Herrlich, NP  04/24/16 2:24 PM

## 2016-04-24 NOTE — Assessment & Plan Note (Signed)
In accordance with CMS guidelines, patient has met eligibility criteria including age, absence of signs or symptoms of lung cancer.  Social History  Substance Use Topics  . Smoking status: Former Smoker    Packs/day: 1.00    Years: 50.00    Types: Cigarettes    Quit date: 01/01/2014  . Smokeless tobacco: Never Used     Comment: smoked 1ppd 50 years. Quit 2015  . Alcohol use No     Comment: Previous heavy drinker     A shared decision-making session was conducted prior to the performance of CT scan. This includes one or more decision aids, includes benefits and harms of screening, follow-up diagnostic testing, over-diagnosis, false positive rate, and total radiation exposure.  Counseling on the importance of adherence to annual lung cancer LDCT screening, impact of co-morbidities, and ability or willingness to undergo diagnosis and treatment is imperative for compliance of the program.  Counseling on the importance of continued smoking cessation for former smokers; the importance of smoking cessation for current smokers, and information about tobacco cessation interventions have been given to patient including [place] and 1800 quit [place] programs.  Written order for lung cancer screening with LDCT has been given to the patient and any and all questions have been answered to the best of my abilities.   Yearly follow up will be coordinated by Burgess Estelle, Thoracic Navigator.

## 2016-04-25 ENCOUNTER — Other Ambulatory Visit (INDEPENDENT_AMBULATORY_CARE_PROVIDER_SITE_OTHER): Payer: Medicare Other

## 2016-04-25 DIAGNOSIS — Z1211 Encounter for screening for malignant neoplasm of colon: Secondary | ICD-10-CM

## 2016-04-25 LAB — IFOBT (OCCULT BLOOD): IFOBT: NEGATIVE

## 2016-04-25 NOTE — Progress Notes (Signed)
Patient returned OC light test kit today. Results were negative. 

## 2016-04-26 ENCOUNTER — Encounter: Payer: Self-pay | Admitting: *Deleted

## 2016-04-27 ENCOUNTER — Telehealth: Payer: Self-pay | Admitting: Family Medicine

## 2016-04-27 ENCOUNTER — Encounter: Payer: Self-pay | Admitting: Family Medicine

## 2016-04-27 DIAGNOSIS — I700 Atherosclerosis of aorta: Secondary | ICD-10-CM | POA: Insufficient documentation

## 2016-04-27 DIAGNOSIS — E781 Pure hyperglyceridemia: Secondary | ICD-10-CM

## 2016-04-27 MED ORDER — ATORVASTATIN CALCIUM 20 MG PO TABS: 20.0000 mg | ORAL_TABLET | Freq: Every day | ORAL | 5 refills | Status: DC

## 2016-04-27 NOTE — Telephone Encounter (Signed)
Please advise patient that CT scan of chest shows cholesterol plaques in arteries of heart and aorta. Therefore, he is at increased for heart attacks and needs to start taking cholesterol medication. Please call in rx for atorvastatin 20mg  once a day, #30, rf x 5. Also need to take 81mg  aspirin a day if not already doing so. Schedule follow up in 3 months.

## 2016-04-27 NOTE — Telephone Encounter (Signed)
[facility] 04/27/2016  Thanks,   -Terika Pillard  

## 2016-04-27 NOTE — Telephone Encounter (Signed)
Pt advised RX sent to [facility].   Thanks,   -Ionia Schey  

## 2016-07-24 ENCOUNTER — Encounter: Payer: Self-pay | Admitting: Family Medicine

## 2016-07-24 ENCOUNTER — Ambulatory Visit (INDEPENDENT_AMBULATORY_CARE_PROVIDER_SITE_OTHER): Payer: Medicare Other | Admitting: Family Medicine

## 2016-07-24 VITALS — BP 140/72 | HR 64 | Temp 97.9°F | Resp 16 | Wt 190.0 lb

## 2016-07-24 DIAGNOSIS — E781 Pure hyperglyceridemia: Secondary | ICD-10-CM

## 2016-07-24 NOTE — Progress Notes (Signed)
Patient: Drew Russell Male    DOB: 09/29/1945   71 y.o.   MRN: [id number] Visit Date: 07/24/2016  Today's Provider: Mila Merryonald Mustaf Antonacci, MD   Chief Complaint  Patient presents with  . Hyperlipidemia    follow up   Subjective:    HPI  Lipid/Cholesterol, Follow-up:   Last seen for this 4 months ago.  Management changes since that visit include ordering labs which showed elevated cholesterol levels. CT scan of chest was also ordered showing plaques in the arteries of his heart and aorta. Patient was started on Atorvastatin 20mg  daily and advised to start staking a daily Aspirin 81mg .  . Last Lipid Panel:    Component Value Date/Time   CHOL 156 04/18/2016 1559   TRIG 85 04/18/2016 1559   HDL 40 04/18/2016 1559   CHOLHDL 3.9 04/18/2016 1559   LDLCALC 99 04/18/2016 1559    Risk factors for vascular disease include hypercholesterolemia  He reports good compliance with treatment. He is not having side effects.  Current symptoms include none and have been stable. Weight trend: decreasing steadily Prior visit with dietician: no Current diet: in general, an "unhealthy" diet Current exercise: none  Wt Readings from Last 3 Encounters:  04/24/16 194 lb (88 kg)  04/18/16 194 lb (88 kg)  09/17/14 191 lb (86.6 kg)    ------------------------------------------------------------------- Current Exercise Habits: The patient does not participate in regular exercise at present Exercise limited by: None identified     No Known Allergies   Current Outpatient Prescriptions:  .  aspirin EC 81 MG tablet, Take 81 mg by mouth daily., Disp: , Rfl:  .  atorvastatin (LIPITOR) 20 MG tablet, Take 1 tablet (20 mg total) by mouth daily., Disp: 30 tablet, Rfl: 5 .  Omega-3 Fatty Acids (FISH OIL) 1000 MG CAPS, Take 2 tablets by mouth daily., Disp: , Rfl:   Review of Systems  Constitutional: Negative for appetite change, chills and fever.  Respiratory: Negative for chest tightness, shortness  of breath and wheezing.   Cardiovascular: Negative for chest pain and palpitations.  Gastrointestinal: Negative for abdominal pain, nausea and vomiting.    Social History  Substance Use Topics  . Smoking status: Former Smoker    Packs/day: 1.00    Years: 50.00    Types: Cigarettes    Quit date: 01/01/2014  . Smokeless tobacco: Never Used     Comment: smoked 1ppd 50 years. Quit 2015  . Alcohol use No     Comment: Previous heavy drinker   Objective:   BP 140/72 (BP Location: Left Arm, Patient Position: Sitting, Cuff Size: Large)   Pulse 64   Temp 97.9 F (36.6 C) (Oral)   Resp 16   Wt 190 lb (86.2 kg)   SpO2 97% Comment: room air  BMI 29.76 kg/m  There were no vitals filed for this visit.   Physical Exam   General Appearance:    Alert, cooperative, no distress  Eyes:    PERRL, conjunctiva/corneas clear, EOM's intact       Lungs:     Clear to auscultation bilaterally, respirations unlabored  Heart:    Regular rate and rhythm  Neurologic:   Awake, alert, oriented x 3. No apparent focal neurological           defect.           Assessment & Plan:     1. Hypertriglyceridemia He is tolerating atorvastatin well with no adverse effects.   - Comprehensive  metabolic panel - Lipid panel       Mila Merry, MD  [facility]

## 2016-07-25 DIAGNOSIS — E781 Pure hyperglyceridemia: Secondary | ICD-10-CM | POA: Diagnosis not present

## 2016-07-26 LAB — COMPREHENSIVE METABOLIC PANEL
A/G RATIO: 1.7 (ref 1.2–2.2)
ALBUMIN: 4.3 g/dL (ref 3.5–4.8)
ALT: 15 IU/L (ref 0–44)
AST: 16 IU/L (ref 0–40)
Alkaline Phosphatase: 70 IU/L (ref 39–117)
BILIRUBIN TOTAL: 0.8 mg/dL (ref 0.0–1.2)
BUN / CREAT RATIO: 12 (ref 10–24)
BUN: 16 mg/dL (ref 8–27)
CHLORIDE: 105 mmol/L (ref 96–106)
CO2: 25 mmol/L (ref 18–29)
Calcium: 9.2 mg/dL (ref 8.6–10.2)
Creatinine, Ser: 1.31 mg/dL — ABNORMAL HIGH (ref 0.76–1.27)
GFR calc non Af Amer: 54 mL/min/{1.73_m2} — ABNORMAL LOW (ref >59)
GFR, EST AFRICAN AMERICAN: 62 mL/min/{1.73_m2} (ref >59)
Globulin, Total: 2.5 g/dL (ref 1.5–4.5)
Glucose: 97 mg/dL (ref 65–99)
POTASSIUM: 4.9 mmol/L (ref 3.5–5.2)
SODIUM: 144 mmol/L (ref 134–144)
TOTAL PROTEIN: 6.8 g/dL (ref 6.0–8.5)

## 2016-07-26 LAB — LIPID PANEL
Chol/HDL Ratio: 2.1 ratio (ref 0.0–5.0)
Cholesterol, Total: 74 mg/dL — ABNORMAL LOW (ref 100–199)
HDL: 36 mg/dL — ABNORMAL LOW (ref >39)
LDL Calculated: 25 mg/dL (ref 0–99)
Triglycerides: 64 mg/dL (ref 0–149)
VLDL Cholesterol Cal: 13 mg/dL (ref 5–40)

## 2016-10-17 ENCOUNTER — Other Ambulatory Visit: Payer: Self-pay | Admitting: Family Medicine

## 2016-10-17 DIAGNOSIS — E781 Pure hyperglyceridemia: Secondary | ICD-10-CM

## 2017-03-21 NOTE — Telephone Encounter (Signed)
This encounter was created in error - please disregard.

## 2017-04-05 ENCOUNTER — Other Ambulatory Visit: Payer: Self-pay | Admitting: Family Medicine

## 2017-04-05 DIAGNOSIS — E781 Pure hyperglyceridemia: Secondary | ICD-10-CM

## 2017-04-05 MED ORDER — ATORVASTATIN CALCIUM 20 MG PO TABS: 20.0000 mg | ORAL_TABLET | Freq: Every day | ORAL | 1 refills | Status: DC

## 2017-04-05 NOTE — Telephone Encounter (Signed)
Requesting 90 day supply.

## 2017-04-05 NOTE — Telephone Encounter (Signed)
[facility] for [facility] 90-days supply for the following medication. Thanks CC ° °atorvastatin (LIPITOR) 20 MG tablet  ° °

## 2017-04-22 ENCOUNTER — Ambulatory Visit (INDEPENDENT_AMBULATORY_CARE_PROVIDER_SITE_OTHER): Payer: Medicare Other

## 2017-04-22 ENCOUNTER — Telehealth: Payer: Self-pay | Admitting: *Deleted

## 2017-04-22 VITALS — BP 144/72 | HR 80 | Temp 98.4°F | Ht 66.0 in | Wt 193.0 lb

## 2017-04-22 DIAGNOSIS — Z0000 Encounter for general adult medical examination without abnormal findings: Secondary | ICD-10-CM

## 2017-04-22 DIAGNOSIS — Z87891 Personal history of nicotine dependence: Secondary | ICD-10-CM

## 2017-04-22 DIAGNOSIS — Z122 Encounter for screening for malignant neoplasm of respiratory organs: Secondary | ICD-10-CM

## 2017-04-22 NOTE — Patient Instructions (Signed)
[name] , Thank you for taking time to come for your Medicare Wellness Visit. I appreciate your ongoing commitment to your health goals. Please review the following plan we discussed and let me know if I can assist you in the future.   Screening recommendations/referrals: Colonoscopy: FOBT due 04/25/17, declined colonoscopy referral. Recommended yearly ophthalmology/optometry visit for glaucoma screening and checkup Recommended yearly dental visit for hygiene and checkup  Vaccinations: Influenza vaccine: Pt declines today.  Pneumococcal vaccine: Up to date Tdap vaccine: Pt declines today.  Shingles vaccine: Pt declines today.     Advanced directives: Advance directive discussed with you today. Even though you declined this today please call our office should you change your mind and we can give you the proper paperwork for you to fill out.  Conditions/risks identified: Recommend exercising for 3 days a week for 30 minutes at a time.   Next appointment: 05/08/17 @ 3 PM with Dr [name].  Preventive Care 6865 Years and Older, Male Preventive care refers to lifestyle choices and visits with your health care provider that can promote health and wellness. What does preventive care include?  A yearly physical exam. This is also called an annual well check.  Dental exams once or twice a year.  Routine eye exams. Ask your health care provider how often you should have your eyes checked.  Personal lifestyle choices, including:  Daily care of your teeth and gums.  Regular physical activity.  Eating a healthy diet.  Avoiding tobacco and drug use.  Limiting alcohol use.  Practicing safe sex.  Taking low doses of aspirin every day.  Taking vitamin and mineral supplements as recommended by your health care provider. What happens during an annual well check? The services and screenings done by your health care provider during your annual well check will depend on your age, overall  health, lifestyle risk factors, and family history of disease. Counseling  Your health care provider may ask you questions about your:  Alcohol use.  Tobacco use.  Drug use.  Emotional well-being.  Home and relationship well-being.  Sexual activity.  Eating habits.  History of falls.  Memory and ability to understand (cognition).  Work and work Astronomerenvironment. Screening  You may have the following tests or measurements:  Height, weight, and BMI.  Blood pressure.  Lipid and cholesterol levels. These may be checked every 5 years, or more frequently if you are over 74 years old.  Skin check.  Lung cancer screening. You may have this screening every year starting at age 74 if you have a 30-pack-year history of smoking and currently smoke or have quit within the past 15 years.  Fecal occult blood test (FOBT) of the stool. You may have this test every year starting at age 74.  Flexible sigmoidoscopy or colonoscopy. You may have a sigmoidoscopy every 5 years or a colonoscopy every 10 years starting at age 74.  Prostate cancer screening. Recommendations will vary depending on your family history and other risks.  Hepatitis C blood test.  Hepatitis B blood test.  Sexually transmitted disease (STD) testing.  Diabetes screening. This is done by checking your blood sugar (glucose) after you have not eaten for a while (fasting). You may have this done every 1-3 years.  Abdominal aortic aneurysm (AAA) screening. You may need this if you are a current or former smoker.  Osteoporosis. You may be screened starting at age 74 if you are at high risk. Talk with your health care provider about your  test results, treatment options, and if necessary, the need for more tests. Vaccines  Your health care provider may recommend certain vaccines, such as:  Influenza vaccine. This is recommended every year.  Tetanus, diphtheria, and acellular pertussis (Tdap, Td) vaccine. You may need a Td  booster every 10 years.  Zoster vaccine. You may need this after age 59.  Pneumococcal 13-valent conjugate (PCV13) vaccine. One dose is recommended after age 35.  Pneumococcal polysaccharide (PPSV23) vaccine. One dose is recommended after age 59. Talk to your health care provider about which screenings and vaccines you need and how often you need them. This information is not intended to replace advice given to you by your health care provider. Make sure you discuss any questions you have with your health care provider. Document Released: 03/04/2015 Document Revised: 10/26/2015 Document Reviewed: 12/07/2014 Elsevier Interactive Patient Education  2017 [employer] Prevention in the Home Falls can cause injuries. They can happen to people of all ages. There are many things you can do to make your home safe and to help prevent falls. What can I do on the outside of my home?  Regularly fix the edges of walkways and driveways and fix any cracks.  Remove anything that might make you trip as you walk through a door, such as a raised step or threshold.  Trim any bushes or trees on the path to your home.  Use bright outdoor lighting.  Clear any walking paths of anything that might make someone trip, such as rocks or tools.  Regularly check to see if handrails are loose or broken. Make sure that both sides of any steps have handrails.  Any raised decks and porches should have guardrails on the edges.  Have any leaves, snow, or ice cleared regularly.  Use sand or salt on walking paths during winter.  Clean up any spills in your garage right away. This includes oil or grease spills. What can I do in the bathroom?  Use night lights.  Install grab bars by the toilet and in the tub and shower. Do not use towel bars as grab bars.  Use non-skid mats or decals in the tub or shower.  If you need to sit down in the shower, use a plastic, non-slip stool.  Keep the floor dry. Clean up  any water that spills on the floor as soon as it happens.  Remove soap buildup in the tub or shower regularly.  Attach bath mats securely with double-sided non-slip rug tape.  Do not have throw rugs and other things on the floor that can make you trip. What can I do in the bedroom?  Use night lights.  Make sure that you have a light by your bed that is easy to reach.  Do not use any sheets or blankets that are too big for your bed. They should not hang down onto the floor.  Have a firm chair that has side arms. You can use this for support while you get dressed.  Do not have throw rugs and other things on the floor that can make you trip. What can I do in the kitchen?  Clean up any spills right away.  Avoid walking on wet floors.  Keep items that you use a lot in easy-to-reach places.  If you need to reach something above you, use a strong step stool that has a grab bar.  Keep electrical cords out of the way.  Do not use floor polish or wax that  makes floors slippery. If you must use wax, use non-skid floor wax.  Do not have throw rugs and other things on the floor that can make you trip. What can I do with my stairs?  Do not leave any items on the stairs.  Make sure that there are handrails on both sides of the stairs and use them. Fix handrails that are broken or loose. Make sure that handrails are as long as the stairways.  Check any carpeting to make sure that it is firmly attached to the stairs. Fix any carpet that is loose or worn.  Avoid having throw rugs at the top or bottom of the stairs. If you do have throw rugs, attach them to the floor with carpet tape.  Make sure that you have a light switch at the top of the stairs and the bottom of the stairs. If you do not have them, ask someone to add them for you. What else can I do to help prevent falls?  Wear shoes that:  Do not have high heels.  Have rubber bottoms.  Are comfortable and fit you well.  Are  closed at the toe. Do not wear sandals.  If you use a stepladder:  Make sure that it is fully opened. Do not climb a closed stepladder.  Make sure that both sides of the stepladder are locked into place.  Ask someone to hold it for you, if possible.  Clearly mark and make sure that you can see:  Any grab bars or handrails.  First and last steps.  Where the edge of each step is.  Use tools that help you move around (mobility aids) if they are needed. These include:  Canes.  Walkers.  Scooters.  Crutches.  Turn on the lights when you go into a dark area. Replace any light bulbs as soon as they burn out.  Set up your furniture so you have a clear path. Avoid moving your furniture around.  If any of your floors are uneven, fix them.  If there are any pets around you, be aware of where they are.  Review your medicines with your doctor. Some medicines can make you feel dizzy. This can increase your chance of falling. Ask your doctor what other things that you can do to help prevent falls. This information is not intended to replace advice given to you by your health care provider. Make sure you discuss any questions you have with your health care provider. Document Released: 12/02/2008 Document Revised: 07/14/2015 Document Reviewed: 03/12/2014 Elsevier Interactive Patient Education  2017 [employer].

## 2017-04-22 NOTE — Progress Notes (Signed)
Subjective:   Drew Russell is a 72 y.o. male who presents for Medicare Annual/Subsequent preventive examination.  Review of Systems:  N/A Cardiac Risk Factors include: advanced age (>3755men, 60>65 women);male gender;hypertension;dyslipidemia;obesity (BMI >30kg/m2)     Objective:    Vitals: BP (!) 144/72 (BP Location: Left Arm)   Pulse 80   Temp 98.4 F (36.9 C) (Oral)   Ht 5\' 6"  (1.676 m)   Wt 193 lb (87.5 kg)   BMI 31.15 kg/m   Body mass index is 31.15 kg/m.  Advanced Directives 04/22/2017 04/18/2016  Does Patient Have a Medical Advance Directive? No No  Would patient like information on creating a medical advance directive? No - Patient declined -    Tobacco Social History   Tobacco Use  Smoking Status Former Smoker  . Packs/day: 1.00  . Years: 50.00  . Pack years: 50.00  . Types: Cigarettes  . Last attempt to quit: 01/01/2014  . Years since quitting: 3.3  Smokeless Tobacco Never Used  Tobacco Comment   smoked 1ppd 50 years. Quit 2015     Counseling given: Not Answered Comment: smoked 1ppd 50 years. Quit 2015   Clinical Intake:  Pre-visit preparation completed: Yes  Pain : No/denies pain Pain Score: 0-No pain     Nutritional Status: BMI > 30  Obese Nutritional Risks: None Diabetes: No  How often do you need to have someone help you when you read instructions, pamphlets, or other written materials from your doctor or pharmacy?: 1 - Never  Interpreter Needed?: No  Information entered by :: [facility], LPN  Past Medical History:  Diagnosis Date  . History of chicken pox   . History of measles as a child   . History of mumps as a child    Past Surgical History:  Procedure Laterality Date  . [facility]  . Cardiac Catherterization     per pt had angioplast in 1995, no stent  . CATARACT EXTRACTION Bilateral 2007   Family History  Problem Relation Age of Onset  . Stroke Mother   . Heart disease  Father   . Heart attack Father   . Colon cancer Sister   . Diabetes Sister        type 2  . Diabetes Sister        type 2   Social History   Socioeconomic History  . Marital status: Single    Spouse name: None  . Number of children: 0  . Years of education: 8th grade  . Highest education level: 8th grade  Social Needs  . Financial resource strain: Not hard at all  . Food insecurity - worry: Never true  . Food insecurity - inability: Never true  . Transportation needs - medical: No  . Transportation needs - non-medical: No  Occupational History  . Occupation: Retired  Tobacco Use  . Smoking status: Former Smoker    Packs/day: 1.00    Years: 50.00    Pack years: 50.00    Types: Cigarettes    Last attempt to quit: 01/01/2014    Years since quitting: 3.3  . Smokeless tobacco: Never Used  . Tobacco comment: smoked 1ppd 50 years. Quit 2015  Substance and Sexual Activity  . Alcohol use: No    Alcohol/week: 0.0 oz    Comment: Previous heavy drinker  . Drug use: No  . Sexual activity: None  Other Topics Concern  . None  Social History Narrative  .  None    Outpatient Encounter Medications as of 04/22/2017  Medication Sig  . atorvastatin (LIPITOR) 20 MG tablet Take 1 tablet (20 mg total) by mouth daily.  . Omega-3 Fatty Acids (FISH OIL) 1000 MG CAPS Take 1,200 mg by mouth daily.   Marland Kitchen aspirin EC 81 MG tablet Take 81 mg by mouth daily.   No facility-administered encounter medications on file as of 04/22/2017.     Activities of Daily Living In your present state of health, do you have any difficulty performing the following activities: 04/22/2017  Hearing? N  Vision? N  Difficulty concentrating or making decisions? N  Walking or climbing stairs? N  Dressing or bathing? N  Doing errands, shopping? N  Preparing Food and eating ? N  Using the Toilet? N  In the past six months, have you accidently leaked urine? N  Do you have problems with loss of bowel control? N  Managing  your Medications? N  Managing your Finances? Y  Housekeeping or managing your Housekeeping? N  Some recent data might be hidden    Patient Care Team: Malva Limes, MD as PCP - General (Family Medicine)   Assessment:   This is a routine wellness examination for [facility].  Exercise Activities and Dietary recommendations Current Exercise Habits: The patient does not participate in regular exercise at present, Exercise limited by: None identified  Goals    . Exercise 3x per week (30 min per time)     Recommend exercising for 3 days a week for 30 minutes at a time.        Fall Risk Fall Risk  04/22/2017 04/18/2016 09/17/2014  Falls in the past year? No No No   Is the patient's home free of loose throw rugs in walkways, pet beds, electrical cords, etc?   yes      Grab bars in the bathroom? no      Handrails on the stairs?   no      Adequate lighting?   yes  Timed Get Up and Go Performed: N/A  Depression Screen PHQ 2/9 Scores 04/22/2017 04/22/2017 04/18/2016 04/18/2016  PHQ - 2 Score 0 0 0 1  PHQ- 9 Score 0 - 0 -    Cognitive Function     6CIT Screen 04/22/2017 04/18/2016  What Year? 0 points 0 points  What month? 0 points 0 points  What time? 0 points 0 points  Count back from 20 0 points 0 points  Months in reverse 0 points 0 points  Repeat phrase 4 points 6 points  Total Score 4 6    Immunization History  Administered Date(s) Administered  . Influenza, High Dose Seasonal PF 04/18/2016  . Pneumococcal Conjugate-13 06/12/2013  . Pneumococcal Polysaccharide-23 04/09/2012    Qualifies for Shingles Vaccine? Pt declines today.   Screening Tests Health Maintenance  Topic Date Due  . INFLUENZA VACCINE  05/19/2017 (Originally 09/19/2016)  . COLONOSCOPY  02/19/2026 (Originally 10/28/1993)  . TETANUS/TDAP  02/19/2026 (Originally 10/29/1962)  . COLON CANCER SCREENING ANNUAL FOBT  04/25/2017  . Hepatitis C Screening  Completed  . PNA vac Low Risk Adult  Completed   Cancer  Screenings: Lung: Low Dose CT Chest recommended if Age 72-80 years, 30 pack-year currently smoking OR have quit w/in 15years. Patient does qualify, order on file.  Colorectal: FOBT due 04/25/17, declined colonoscopy referral.  Additional Screenings:  Hepatitis C Screening: Up to date    Plan:  I have personally reviewed and addressed the Medicare Annual Wellness  questionnaire and have noted the following in the patient's chart:  A. Medical and social history B. Use of alcohol, tobacco or illicit drugs  C. Current medications and supplements D. Functional ability and status E.  Nutritional status F.  Physical activity G. Advance directives H. List of other physicians I.  Hospitalizations, surgeries, and ER visits in previous 12 months J.  Vitals K. Screenings such as hearing and vision if needed, cognitive and depression L. Referrals and appointments - none  In addition, I have reviewed and discussed with patient certain preventive protocols, quality metrics, and best practice recommendations. A written personalized care plan for preventive services as well as general preventive health recommendations were provided to patient.  See attached scanned questionnaire for additional information.   Signed,  Hyacinth Meeker, LPN Nurse Health Advisor   Nurse Recommendations: Pt declined the influenza vaccine and colonoscopy referral today. FOBT due 04/25/17.

## 2017-04-22 NOTE — Telephone Encounter (Signed)
Notified patient that annual lung cancer screening low dose CT scan is due currently or will be in near future. Confirmed that patient is within the age range of 55-77, and asymptomatic, (no signs or symptoms of lung cancer). Patient denies illness that would prevent curative treatment for lung cancer if found. Verified smoking history, (former, quit 12/22/13, 50 pack year). The shared decision making visit was done 04/24/16. Patient is agreeable for CT scan being scheduled.

## 2017-04-22 NOTE — Telephone Encounter (Signed)
Left message for patient to notify them that it is time to schedule annual low dose lung cancer screening CT scan. Instructed patient to call back to verify information prior to the scan being scheduled.  

## 2017-04-29 ENCOUNTER — Ambulatory Visit
Admission: RE | Admit: 2017-04-29 | Discharge: 2017-04-29 | Disposition: A | Discharge status: Home / Self Care | Payer: Medicare Other | Source: Ambulatory Visit | Attending: Oncology | Admitting: Oncology

## 2017-04-29 DIAGNOSIS — J438 Other emphysema: Secondary | ICD-10-CM | POA: Diagnosis not present

## 2017-04-29 DIAGNOSIS — J432 Centrilobular emphysema: Secondary | ICD-10-CM | POA: Insufficient documentation

## 2017-04-29 DIAGNOSIS — Z87891 Personal history of nicotine dependence: Secondary | ICD-10-CM

## 2017-04-29 DIAGNOSIS — I700 Atherosclerosis of aorta: Secondary | ICD-10-CM | POA: Insufficient documentation

## 2017-04-29 DIAGNOSIS — I2510 Atherosclerotic heart disease of native coronary artery without angina pectoris: Secondary | ICD-10-CM | POA: Insufficient documentation

## 2017-04-29 DIAGNOSIS — Z122 Encounter for screening for malignant neoplasm of respiratory organs: Secondary | ICD-10-CM

## 2017-04-29 DIAGNOSIS — R918 Other nonspecific abnormal finding of lung field: Secondary | ICD-10-CM | POA: Diagnosis not present

## 2017-05-01 ENCOUNTER — Telehealth: Payer: Self-pay | Admitting: Family Medicine

## 2017-05-01 NOTE — Telephone Encounter (Signed)
Patient would like to have a "colonoscopy kit" left at front desk by Monday.  He says he does this every year to check the blood in stools.

## 2017-05-01 NOTE — Telephone Encounter (Signed)
Please leave OC-Lyte for him at front desk.

## 2017-05-01 NOTE — Telephone Encounter (Signed)
OC-Lite at front desk.

## 2017-05-01 NOTE — Telephone Encounter (Signed)
Please advise 

## 2017-05-06 ENCOUNTER — Encounter: Payer: Self-pay | Admitting: Family Medicine

## 2017-05-06 ENCOUNTER — Encounter: Payer: Self-pay | Admitting: *Deleted

## 2017-05-06 DIAGNOSIS — J439 Emphysema, unspecified: Secondary | ICD-10-CM | POA: Insufficient documentation

## 2017-05-08 ENCOUNTER — Ambulatory Visit (INDEPENDENT_AMBULATORY_CARE_PROVIDER_SITE_OTHER): Payer: Medicare Other | Admitting: Family Medicine

## 2017-05-08 ENCOUNTER — Encounter: Payer: Self-pay | Admitting: Family Medicine

## 2017-05-08 DIAGNOSIS — I498 Other specified cardiac arrhythmias: Secondary | ICD-10-CM | POA: Diagnosis not present

## 2017-05-08 DIAGNOSIS — I10 Essential (primary) hypertension: Secondary | ICD-10-CM

## 2017-05-08 DIAGNOSIS — E781 Pure hyperglyceridemia: Secondary | ICD-10-CM

## 2017-05-08 DIAGNOSIS — I2510 Atherosclerotic heart disease of native coronary artery without angina pectoris: Secondary | ICD-10-CM | POA: Diagnosis not present

## 2017-05-08 DIAGNOSIS — Z1211 Encounter for screening for malignant neoplasm of colon: Secondary | ICD-10-CM | POA: Diagnosis not present

## 2017-05-08 DIAGNOSIS — Z0000 Encounter for general adult medical examination without abnormal findings: Secondary | ICD-10-CM | POA: Diagnosis not present

## 2017-05-08 DIAGNOSIS — Z0001 Encounter for general adult medical examination with abnormal findings: Secondary | ICD-10-CM | POA: Diagnosis not present

## 2017-05-08 DIAGNOSIS — Z6830 Body mass index (BMI) 30.0-30.9, adult: Secondary | ICD-10-CM | POA: Diagnosis not present

## 2017-05-08 DIAGNOSIS — Z125 Encounter for screening for malignant neoplasm of prostate: Secondary | ICD-10-CM | POA: Diagnosis not present

## 2017-05-08 LAB — IFOBT (OCCULT BLOOD): IMMUNOLOGICAL FECAL OCCULT BLOOD TEST: NEGATIVE

## 2017-05-08 NOTE — Progress Notes (Signed)
Patient: Drew Russell, Male    DOB: 06/08/1945, 72 y.o.   MRN: [id number] Visit Date: 05/08/2017  Today's Provider: Mila Merryonald Fisher, MD   Chief Complaint  Patient presents with  . Annual Exam  . Hypertension  . Hyperlipidemia   Subjective:   Patient saw McKenzie for AWV on 04/22/2017.   Complete Physical Drew IshikawaHoward D Silvestri is a 72 y.o. male. He feels well. He reports exercising none. He reports he is sleeping well.  -----------------------------------------------------------    Hypertension, follow-up:  BP Readings from Last 3 Encounters:  05/08/17 120/84  04/22/17 (!) 144/72  07/24/16 140/72    He was last seen for hypertension 04/18/2016.  BP at that visit was 150/70. Management since that visit includes; labs checked. Recommended taking a daily aspirin 81 mg.He reports good compliance with treatment. He is not having side effects. none He is not exercising. He is not adherent to low salt diet.   Outside blood pressures are not checking. He is experiencing none.  Patient denies none.   Cardiovascular risk factors include advanced age (older than 7455 for men, 6465 for women).  Use of agents associated with hypertension: none.   -----------------------------------------------------------------   Lipid/Cholesterol, Follow-up:   Last seen for this 9 months ago.  Management since that visit includes; labs checked, no changes.  Last Lipid Panel:    Component Value Date/Time   CHOL 74 (L) 07/25/2016 1129   TRIG 64 07/25/2016 1129   HDL 36 (L) 07/25/2016 1129   CHOLHDL 2.1 07/25/2016 1129   LDLCALC 25 07/25/2016 1129    He reports good compliance with treatment. He is not having side effects. none  Wt Readings from Last 3 Encounters:  05/08/17 188 lb (85.3 kg)  04/29/17 193 lb (87.5 kg)  04/22/17 193 lb (87.5 kg)    ----------------------------------------------------------------   Review of Systems  HENT: Positive for tinnitus.   Respiratory:  Positive for shortness of breath.   Gastrointestinal: Positive for constipation.  Musculoskeletal: Positive for neck pain.  All other systems reviewed and are negative.   Social History   Socioeconomic History  . Marital status: Single    Spouse name: Not on file  . Number of children: 0  . Years of education: 8th grade  . Highest education level: 8th grade  Social Needs  . Financial resource strain: Not hard at all  . Food insecurity - worry: Never true  . Food insecurity - inability: Never true  . Transportation needs - medical: No  . Transportation needs - non-medical: No  Occupational History  . Occupation: Retired  Tobacco Use  . Smoking status: Former Smoker    Packs/day: 1.00    Years: 50.00    Pack years: 50.00    Types: Cigarettes    Last attempt to quit: 01/01/2014    Years since quitting: 3.3  . Smokeless tobacco: Never Used  . Tobacco comment: smoked 1ppd 50 years. Quit 2015  Substance and Sexual Activity  . Alcohol use: No    Alcohol/week: 0.0 oz    Comment: Previous heavy drinker  . Drug use: No  . Sexual activity: Not on file  Other Topics Concern  . Not on file  Social History Narrative  . Not on file    Past Medical History:  Diagnosis Date  . History of chicken pox   . History of measles as a child   . History of mumps as a child      Patient Active  Problem List   Diagnosis Date Noted  . Emphysema lung (HCC) 05/06/2017  . Aortic atherosclerosis (HCC) 04/27/2016  . Coronary artery disease 09/16/2014  . Essential (primary) hypertension 09/16/2014  . Heartburn 09/16/2014  . Hypertriglyceridemia 09/16/2014  . History of smoking 30 or more pack years 09/16/2014    Past Surgical History:  Procedure Laterality Date  . ANGIOPLASTY  05/21/1993   [facility]  . Cardiac Catherterization     per pt had angioplast in 1995, no stent  . CATARACT EXTRACTION Bilateral 2007    His family history includes Colon cancer in his  sister; Diabetes in his sister and sister; Heart attack in his father; Heart disease in his father; Stroke in his mother.      Current Outpatient Medications:  .  atorvastatin (LIPITOR) 20 MG tablet, Take 1 tablet (20 mg total) by mouth daily., Disp: 90 tablet, Rfl: 1 .  Omega-3 Fatty Acids (FISH OIL) 1000 MG CAPS, Take 1,200 mg by mouth daily. , Disp: , Rfl:  .  aspirin EC 81 MG tablet, Take 81 mg by mouth daily., Disp: , Rfl:   Patient Care Team: Malva Limes, MD as PCP - General (Family Medicine)     Objective:   Vitals: BP 120/84 (BP Location: Right Arm, Patient Position: Sitting, Cuff Size: Normal)   Pulse 68   Temp 97.9 F (36.6 C) (Oral)   Resp 16   Ht 5\' 6"  (1.676 m)   Wt 188 lb (85.3 kg)   SpO2 98%   BMI 30.34 kg/m   Physical Exam   General Appearance:    Alert, cooperative, no distress, appears stated age, overweight  Head:    Normocephalic, without obvious abnormality, atraumatic  Eyes:    PERRL, conjunctiva/corneas clear, EOM's intact, fundi    benign, both eyes       Ears:    Normal TM's and external ear canals, both ears  Nose:   Nares normal, septum midline, mucosa normal, no drainage   or sinus tenderness  Throat:   Lips, mucosa, and tongue normal; teeth and gums normal  Neck:   Supple, symmetrical, trachea midline, no adenopathy;       thyroid:  No enlargement/tenderness/nodules; no carotid   bruit or JVD  Back:     Symmetric, no curvature, ROM normal, no CVA tenderness  Lungs:     Clear to auscultation bilaterally, respirations unlabored  Chest wall:    No tenderness or deformity  Heart:    Regular rate and rhythm, S1 and S2 normal, no murmur, rub   or gallop  Abdomen:     Soft, non-tender, bowel sounds active all four quadrants,    no masses, no organomegaly  Genitalia:    deferred  Rectal:    deferred  Extremities:   Extremities normal, atraumatic, no cyanosis or edema  Pulses:   2+ and symmetric all extremities  Skin:   Skin color, texture,  turgor normal, no rashes or lesions  Lymph nodes:   Cervical, supraclavicular, and axillary nodes normal  Neurologic:   CNII-XII intact. Normal strength, sensation and reflexes      throughout    EKG: Couplets of QRS complexes with otherwise normal morphology. P-waves not clearly seen, but appear to be several small irregularly timed p-waves.    Activities of Daily Living In your present state of health, do you have any difficulty performing the following activities: 04/22/2017  Hearing? N  Vision? N  Difficulty concentrating or making decisions? N  Walking or climbing stairs? N  Dressing or bathing? N  Doing errands, shopping? N  Preparing Food and eating ? N  Using the Toilet? N  In the past six months, have you accidently leaked urine? N  Do you have problems with loss of bowel control? N  Managing your Medications? N  Managing your Finances? Y  Housekeeping or managing your Housekeeping? N  Some recent data might be hidden    Fall Risk Assessment Fall Risk  04/22/2017 04/18/2016 09/17/2014  Falls in the past year? No No No     Depression Screen PHQ 2/9 Scores 04/22/2017 04/22/2017 04/18/2016 04/18/2016  PHQ - 2 Score 0 0 0 1  PHQ- 9 Score 0 - 0 -       Assessment & Plan:    Annual Physical Reviewed patient's Family Medical History Reviewed and updated list of patient's medical providers Assessment of cognitive impairment was done Assessed patient's functional ability Established a written schedule for health screening services Health Risk Assessent Completed and Reviewed  Exercise Activities and Dietary recommendations Goals    . Exercise 3x per week (30 min per time)     Recommend exercising for 3 days a week for 30 minutes at a time.        Immunization History  Administered Date(s) Administered  . Influenza, High Dose Seasonal PF 04/18/2016  . Pneumococcal Conjugate-13 06/12/2013  . Pneumococcal Polysaccharide-23 04/09/2012    Health Maintenance  Topic  Date Due  . INFLUENZA VACCINE  05/19/2017 (Originally 09/19/2016)  . COLONOSCOPY  02/19/2026 (Originally 10/28/1993)  . TETANUS/TDAP  02/19/2026 (Originally 10/29/1962)  . COLON CANCER SCREENING ANNUAL FOBT  05/09/2018  . Hepatitis C Screening  Completed  . PNA vac Low Risk Adult  Completed     Discussed health benefits of physical activity, and encouraged him to engage in regular exercise appropriate for his age and condition.    ------------------------------------------------------------------------------------------------------------  1. Annual physical exam  - EKG 12-Lead - Lipid panel - Comprehensive metabolic panel - PSA - Magnesium - TSH  2. Colon cancer screening  - IFOBT POC (occult bld, rslt in office)  3. BMI 30.0-30.9,adult Counseled regarding prudent diet and regular exercise.    4. Hypertriglyceridemia He is tolerating atorvastatin well with no adverse effects.    5. Prostate cancer screening  - PSA  6. Essential (primary) hypertension Well controlled.  Continue current medications.   - EKG 12-Lead  7. Coronary artery disease involving native coronary artery of native heart without angina pectoris Asymptomatic. Compliant with medication.  Continue aggressive risk factor modification.   - Lipid panel - Comprehensive metabolic panel  8. Atrial arrhythmia New compared to most recent ECG from 04/09/2012. He is to restart 81mg  ECASA and follow up with cardiology.  - Magnesium - TSH - Ambulatory referral to Cardiology    Mila Merry, MD  [facility]

## 2017-05-08 NOTE — Patient Instructions (Addendum)
   Recommend taking 81mg  enteric coated aspirin to reduce risk of vascular events such as heart attacks and strokes.     The CDC recommends two doses of Shingrix (the shingles vaccine) separated by 2 to 6 months for adults age 74 years and older. I recommend checking with your pharmacyplan regarding coverage for this vaccine.

## 2017-05-09 LAB — COMPREHENSIVE METABOLIC PANEL
ALBUMIN: 4.7 g/dL (ref 3.5–4.8)
ALK PHOS: 73 IU/L (ref 39–117)
ALT: 21 IU/L (ref 0–44)
AST: 29 IU/L (ref 0–40)
Albumin/Globulin Ratio: 2 (ref 1.2–2.2)
BUN / CREAT RATIO: 13 (ref 10–24)
BUN: 16 mg/dL (ref 8–27)
Bilirubin Total: 1 mg/dL (ref 0.0–1.2)
CO2: 25 mmol/L (ref 20–29)
CREATININE: 1.25 mg/dL (ref 0.76–1.27)
Calcium: 9.3 mg/dL (ref 8.6–10.2)
Chloride: 105 mmol/L (ref 96–106)
GFR calc Af Amer: 66 mL/min/{1.73_m2} (ref >59)
GFR calc non Af Amer: 57 mL/min/{1.73_m2} — ABNORMAL LOW (ref >59)
Globulin, Total: 2.3 g/dL (ref 1.5–4.5)
Glucose: 92 mg/dL (ref 65–99)
Potassium: 4.5 mmol/L (ref 3.5–5.2)
Sodium: 143 mmol/L (ref 134–144)
TOTAL PROTEIN: 7 g/dL (ref 6.0–8.5)

## 2017-05-09 LAB — LIPID PANEL
CHOLESTEROL TOTAL: 98 mg/dL — AB (ref 100–199)
Chol/HDL Ratio: 2.6 ratio (ref 0.0–5.0)
HDL: 38 mg/dL — AB (ref >39)
LDL CALC: 44 mg/dL (ref 0–99)
Triglycerides: 81 mg/dL (ref 0–149)
VLDL CHOLESTEROL CAL: 16 mg/dL (ref 5–40)

## 2017-05-09 LAB — PSA: Prostate Specific Ag, Serum: 0.7 ng/mL (ref 0.0–4.0)

## 2017-05-10 ENCOUNTER — Telehealth: Payer: Self-pay | Admitting: *Deleted

## 2017-05-10 NOTE — Telephone Encounter (Signed)
LMOVM to return call.

## 2017-05-10 NOTE — Telephone Encounter (Signed)
Advised  ED 

## 2017-05-10 NOTE — Telephone Encounter (Signed)
-----   Message from Malva Limesonald E Fisher, MD sent at 05/10/2017  7:59 AM EDT ----- Labs are all normal. Continue current medications.  Check yearly

## 2017-05-16 DIAGNOSIS — I25118 Atherosclerotic heart disease of native coronary artery with other forms of angina pectoris: Secondary | ICD-10-CM | POA: Diagnosis not present

## 2017-05-16 DIAGNOSIS — R079 Chest pain, unspecified: Secondary | ICD-10-CM | POA: Diagnosis not present

## 2017-05-31 LAB — TSH: TSH: 1.78 u[IU]/mL (ref 0.450–4.500)

## 2017-05-31 LAB — MAGNESIUM: MAGNESIUM: 2.2 mg/dL (ref 1.6–2.3)

## 2017-05-31 LAB — SPECIMEN STATUS REPORT

## 2017-10-02 ENCOUNTER — Other Ambulatory Visit: Payer: Self-pay | Admitting: Family Medicine

## 2017-10-02 DIAGNOSIS — E781 Pure hyperglyceridemia: Secondary | ICD-10-CM

## 2018-04-19 ENCOUNTER — Telehealth: Payer: Self-pay

## 2018-04-19 NOTE — Telephone Encounter (Signed)
Call pt regarding lung screening. Left message to return call. 

## 2018-04-23 ENCOUNTER — Ambulatory Visit (INDEPENDENT_AMBULATORY_CARE_PROVIDER_SITE_OTHER): Payer: Medicare Other

## 2018-04-23 ENCOUNTER — Encounter: Payer: Self-pay | Admitting: *Deleted

## 2018-04-23 ENCOUNTER — Telehealth: Payer: Self-pay | Admitting: *Deleted

## 2018-04-23 VITALS — BP 112/60 | HR 89 | Temp 98.7°F | Ht 66.0 in | Wt 185.0 lb

## 2018-04-23 DIAGNOSIS — Z0000 Encounter for general adult medical examination without abnormal findings: Secondary | ICD-10-CM | POA: Diagnosis not present

## 2018-04-23 DIAGNOSIS — Z87891 Personal history of nicotine dependence: Secondary | ICD-10-CM

## 2018-04-23 DIAGNOSIS — Z122 Encounter for screening for malignant neoplasm of respiratory organs: Secondary | ICD-10-CM

## 2018-04-23 NOTE — Telephone Encounter (Signed)
Patient has been notified that annual lung cancer screening low dose CT scan is due currently or will be in near future. Confirmed that patient is within the age range of 55-77, and asymptomatic, (no signs or symptoms of lung cancer). Patient denies illness that would prevent curative treatment for lung cancer if found. Verified smoking history, (former, quit 12/22/13, 50 pack year). The shared decision making visit was done 04/24/16. Patient is agreeable for CT scan being scheduled.  

## 2018-04-23 NOTE — Patient Instructions (Signed)
Drew Russell , Thank you for taking time to come for your Medicare Wellness Visit. I appreciate your ongoing commitment to your health goals. Please review the following plan we discussed and let me know if I can assist you in the future.   Screening recommendations/referrals: Colonoscopy: FOBT due 05/09/18 Recommended yearly ophthalmology/optometry visit for glaucoma screening and checkup Recommended yearly dental visit for hygiene and checkup  Vaccinations: Influenza vaccine: Pt declines today.  Pneumococcal vaccine: Completed series Tdap vaccine: Pt declines today.  Shingles vaccine: Pt declines today.     Advanced directives: Advance directive discussed with you today. Even though you declined this today please call our office should you change your mind and we can give you the proper paperwork for you to fill out.  Conditions/risks identified: Recommend to work to increase water intake and start back walking 3 days a week for at least 30 minutes at a time.   Next appointment: 05/27/18 @ 2:00 PM with Dr Sherrie Mustache.   Preventive Care 75 Years and Older, Male Preventive care refers to lifestyle choices and visits with your health care provider that can promote health and wellness. What does preventive care include?  A yearly physical exam. This is also called an annual well check.  Dental exams once or twice a year.  Routine eye exams. Ask your health care provider how often you should have your eyes checked.  Personal lifestyle choices, including:  Daily care of your teeth and gums.  Regular physical activity.  Eating a healthy diet.  Avoiding tobacco and drug use.  Limiting alcohol use.  Practicing safe sex.  Taking low doses of aspirin every day.  Taking vitamin and mineral supplements as recommended by your health care provider. What happens during an annual well check? The services and screenings done by your health care provider during your annual well check will  depend on your age, overall health, lifestyle risk factors, and family history of disease. Counseling  Your health care provider may ask you questions about your:  Alcohol use.  Tobacco use.  Drug use.  Emotional well-being.  Home and relationship well-being.  Sexual activity.  Eating habits.  History of falls.  Memory and ability to understand (cognition).  Work and work Astronomer. Screening  You may have the following tests or measurements:  Height, weight, and BMI.  Blood pressure.  Lipid and cholesterol levels. These may be checked every 5 years, or more frequently if you are over 24 years old.  Skin check.  Lung cancer screening. You may have this screening every year starting at age 75 if you have a 30-pack-year history of smoking and currently smoke or have quit within the past 15 years.  Fecal occult blood test (FOBT) of the stool. You may have this test every year starting at age 75.  Flexible sigmoidoscopy or colonoscopy. You may have a sigmoidoscopy every 5 years or a colonoscopy every 10 years starting at age 75.  Prostate cancer screening. Recommendations will vary depending on your family history and other risks.  Hepatitis C blood test.  Hepatitis B blood test.  Sexually transmitted disease (STD) testing.  Diabetes screening. This is done by checking your blood sugar (glucose) after you have not eaten for a while (fasting). You may have this done every 1-3 years.  Abdominal aortic aneurysm (AAA) screening. You may need this if you are a current or former smoker.  Osteoporosis. You may be screened starting at age 20 if you are at high risk. Talk  with your health care provider about your test results, treatment options, and if necessary, the need for more tests. Vaccines  Your health care provider may recommend certain vaccines, such as:  Influenza vaccine. This is recommended every year.  Tetanus, diphtheria, and acellular pertussis (Tdap,  Td) vaccine. You may need a Td booster every 10 years.  Zoster vaccine. You may need this after age 75.  Pneumococcal 13-valent conjugate (PCV13) vaccine. One dose is recommended after age 75.  Pneumococcal polysaccharide (PPSV23) vaccine. One dose is recommended after age 27. Talk to your health care provider about which screenings and vaccines you need and how often you need them. This information is not intended to replace advice given to you by your health care provider. Make sure you discuss any questions you have with your health care provider. Document Released: 03/04/2015 Document Revised: 10/26/2015 Document Reviewed: 12/07/2014 Elsevier Interactive Patient Education  2017 [employer] Prevention in the Home Falls can cause injuries. They can happen to people of all ages. There are many things you can do to make your home safe and to help prevent falls. What can I do on the outside of my home?  Regularly fix the edges of walkways and driveways and fix any cracks.  Remove anything that might make you trip as you walk through a door, such as a raised step or threshold.  Trim any bushes or trees on the path to your home.  Use bright outdoor lighting.  Clear any walking paths of anything that might make someone trip, such as rocks or tools.  Regularly check to see if handrails are loose or broken. Make sure that both sides of any steps have handrails.  Any raised decks and porches should have guardrails on the edges.  Have any leaves, snow, or ice cleared regularly.  Use sand or salt on walking paths during winter.  Clean up any spills in your garage right away. This includes oil or grease spills. What can I do in the bathroom?  Use night lights.  Install grab bars by the toilet and in the tub and shower. Do not use towel bars as grab bars.  Use non-skid mats or decals in the tub or shower.  If you need to sit down in the shower, use a plastic, non-slip  stool.  Keep the floor dry. Clean up any water that spills on the floor as soon as it happens.  Remove soap buildup in the tub or shower regularly.  Attach bath mats securely with double-sided non-slip rug tape.  Do not have throw rugs and other things on the floor that can make you trip. What can I do in the bedroom?  Use night lights.  Make sure that you have a light by your bed that is easy to reach.  Do not use any sheets or blankets that are too big for your bed. They should not hang down onto the floor.  Have a firm chair that has side arms. You can use this for support while you get dressed.  Do not have throw rugs and other things on the floor that can make you trip. What can I do in the kitchen?  Clean up any spills right away.  Avoid walking on wet floors.  Keep items that you use a lot in easy-to-reach places.  If you need to reach something above you, use a strong step stool that has a grab bar.  Keep electrical cords out of the way.  Do  not use floor polish or wax that makes floors slippery. If you must use wax, use non-skid floor wax.  Do not have throw rugs and other things on the floor that can make you trip. What can I do with my stairs?  Do not leave any items on the stairs.  Make sure that there are handrails on both sides of the stairs and use them. Fix handrails that are broken or loose. Make sure that handrails are as long as the stairways.  Check any carpeting to make sure that it is firmly attached to the stairs. Fix any carpet that is loose or worn.  Avoid having throw rugs at the top or bottom of the stairs. If you do have throw rugs, attach them to the floor with carpet tape.  Make sure that you have a light switch at the top of the stairs and the bottom of the stairs. If you do not have them, ask someone to add them for you. What else can I do to help prevent falls?  Wear shoes that:  Do not have high heels.  Have rubber bottoms.  Are  comfortable and fit you well.  Are closed at the toe. Do not wear sandals.  If you use a stepladder:  Make sure that it is fully opened. Do not climb a closed stepladder.  Make sure that both sides of the stepladder are locked into place.  Ask someone to hold it for you, if possible.  Clearly mark and make sure that you can see:  Any grab bars or handrails.  First and last steps.  Where the edge of each step is.  Use tools that help you move around (mobility aids) if they are needed. These include:  Canes.  Walkers.  Scooters.  Crutches.  Turn on the lights when you go into a dark area. Replace any light bulbs as soon as they burn out.  Set up your furniture so you have a clear path. Avoid moving your furniture around.  If any of your floors are uneven, fix them.  If there are any pets around you, be aware of where they are.  Review your medicines with your doctor. Some medicines can make you feel dizzy. This can increase your chance of falling. Ask your doctor what other things that you can do to help prevent falls. This information is not intended to replace advice given to you by your health care provider. Make sure you discuss any questions you have with your health care provider. Document Released: 12/02/2008 Document Revised: 07/14/2015 Document Reviewed: 03/12/2014 Elsevier Interactive Patient Education  2017 [employer].

## 2018-04-23 NOTE — Progress Notes (Signed)
Subjective:   Drew Russell is a 73 y.o. male who presents for Medicare Annual/Subsequent preventive examination.  Review of Systems:  N/A  Cardiac Risk Factors include: advanced age (>26men, >36 women);hypertension;male gender     Objective:    Vitals: BP 112/60 (BP Location: Right Arm)   Pulse 89   Temp 98.7 F (37.1 C) (Oral)   Ht 5\' 6"  (1.676 m)   Wt 185 lb (83.9 kg)   BMI 29.86 kg/m   Body mass index is 29.86 kg/m.  Advanced Directives 04/23/2018 04/22/2017 04/18/2016  Does Patient Have a Medical Advance Directive? No No No  Would patient like information on creating a medical advance directive? No - Patient declined No - Patient declined -    Tobacco Social History   Tobacco Use  Smoking Status Former Smoker  . Packs/day: 1.00  . Years: 50.00  . Pack years: 50.00  . Types: Cigarettes  . Last attempt to quit: 01/01/2014  . Years since quitting: 4.3  Smokeless Tobacco Never Used  Tobacco Comment   smoked 1ppd 50 years. Quit 2015     Counseling given: Not Answered Comment: smoked 1ppd 50 years. Quit 2015   Clinical Intake:  Pre-visit preparation completed: Yes  Pain : No/denies pain Pain Score: 0-No pain     Nutritional Status: BMI 25 -29 Overweight Nutritional Risks: None Diabetes: No  How often do you need to have someone help you when you read instructions, pamphlets, or other written materials from your doctor or pharmacy?: 1 - Never  Interpreter Needed?: No  Information entered by :: [facility], LPN  Past Medical History:  Diagnosis Date  . History of chicken pox   . History of measles as a child   . History of mumps as a child   . Hypertension    Past Surgical History:  Procedure Laterality Date  . ANGIOPLASTY  05/21/1993   [facility]  . Cardiac Catherterization     per pt had angioplast in 1995, no stent  . CATARACT EXTRACTION Bilateral 2007   Family History  Problem Relation Age of Onset  . Stroke Mother     . Heart disease Father   . Heart attack Father   . Colon cancer Sister   . Diabetes Sister        type 2  . Diabetes Sister        type 2   Social History   Socioeconomic History  . Marital status: Single    Spouse name: Not on file  . Number of children: 0  . Years of education: 8th grade  . Highest education level: 8th grade  Occupational History  . Occupation: Retired  Engineer, production  . Financial resource strain: Not hard at all  . Food insecurity:    Worry: Never true    Inability: Never true  . Transportation needs:    Medical: No    Non-medical: No  Tobacco Use  . Smoking status: Former Smoker    Packs/day: 1.00    Years: 50.00    Pack years: 50.00    Types: Cigarettes    Last attempt to quit: 01/01/2014    Years since quitting: 4.3  . Smokeless tobacco: Never Used  . Tobacco comment: smoked 1ppd 50 years. Quit 2015  Substance and Sexual Activity  . Alcohol use: No    Alcohol/week: 0.0 standard drinks    Comment: Previous heavy drinker  . Drug use: No  . Sexual activity: Not on  file  Lifestyle  . Physical activity:    Days per week: 0 days    Minutes per session: 0 min  . Stress: Not at all  Relationships  . Social connections:    Talks on phone: Patient refused    Gets together: Patient refused    Attends religious service: Patient refused    Active member of club or organization: Patient refused    Attends meetings of clubs or organizations: Patient refused    Relationship status: Patient refused  Other Topics Concern  . Not on file  Social History Narrative  . Not on file    Outpatient Encounter Medications as of 04/23/2018  Medication Sig  . aspirin EC 81 MG tablet Take 81 mg by mouth daily.  Marland Kitchen atorvastatin (LIPITOR) 20 MG tablet TAKE 1 TABLET BY MOUTH EVERY DAY  . Omega-3 Fatty Acids (FISH OIL) 1000 MG CAPS Take 1,200 mg by mouth daily.    No facility-administered encounter medications on file as of 04/23/2018.     Activities of Daily  Living In your present state of health, do you have any difficulty performing the following activities: 04/23/2018  Hearing? N  Vision? N  Comment Pt wears eye glasses daily.   Difficulty concentrating or making decisions? N  Walking or climbing stairs? N  Dressing or bathing? N  Doing errands, shopping? Y  Comment Does not drive.   Preparing Food and eating ? N  Using the Toilet? N  In the past six months, have you accidently leaked urine? N  Do you have problems with loss of bowel control? N  Managing your Medications? N  Managing your Finances? N  Housekeeping or managing your Housekeeping? N  Some recent data might be hidden    Patient Care Team: Malva Limes, MD as PCP - General (Family Medicine)   Assessment:   This is a routine wellness examination for [facility].  Exercise Activities and Dietary recommendations Current Exercise Habits: The patient does not participate in regular exercise at present, Exercise limited by: None identified  Goals    . Exercise 3x per week (30 min per time)     Recommend exercising for 3 days a week for 30 minutes at a time.     . Increase water intake     Starting 04/18/16, I will increase my water intake to 4 glasses a day.       Fall Risk Fall Risk  04/23/2018 04/22/2017 04/18/2016 09/17/2014  Falls in the past year? 0 No No No   FALL RISK PREVENTION PERTAINING TO THE HOME: Any stairs in or around the home? No  If so, do they handrails? N/A  Home free of loose throw rugs in walkways, pet beds, electrical cords, etc? Yes  Adequate lighting in your home to reduce risk of falls? Yes   ASSISTIVE DEVICES UTILIZED TO PREVENT FALLS:  Life alert? No  Use of a cane, walker or w/c? No  Grab bars in the bathroom? No  Shower chair or bench in shower? No  Elevated toilet seat or a handicapped toilet? No    TIMED UP AND GO:  Was the test performed? No .    Depression Screen PHQ 2/9 Scores 04/23/2018 04/22/2017 04/22/2017 04/18/2016  PHQ - 2  Score 0 0 0 0  PHQ- 9 Score - 0 - 0    Cognitive Function     6CIT Screen 04/22/2017 04/18/2016  What Year? 0 points 0 points  What month? 0 points 0 points  What time? 0 points 0 points  Count back from 20 0 points 0 points  Months in reverse 0 points 0 points  Repeat phrase 4 points 6 points  Total Score 4 6    Immunization History  Administered Date(s) Administered  . Influenza, High Dose Seasonal PF 04/18/2016  . Pneumococcal Conjugate-13 06/12/2013  . Pneumococcal Polysaccharide-23 04/09/2012    Qualifies for Shingles Vaccine? Yes . Due for Shingrix. Education has been provided regarding the importance of this vaccine. Pt has been advised to call insurance company to determine out of pocket expense. Advised may also receive vaccine at local pharmacy or Health Dept. Verbalized acceptance and understanding.  Tdap: Although this vaccine is not a covered service during a Wellness Exam, does the patient still wish to receive this vaccine today?  No .  Education has been provided regarding the importance of this vaccine. Advised may receive this vaccine at local pharmacy or Health Dept. Aware to provide a copy of the vaccination record if obtained from local pharmacy or Health Dept. Verbalized acceptance and understanding.  Flu Vaccine: Due for Flu vaccine. Does the patient want to receive this vaccine today?  No . Education has been provided regarding the importance of this vaccine but still declined. Advised may receive this vaccine at local pharmacy or Health Dept. Aware to provide a copy of the vaccination record if obtained from local pharmacy or Health Dept. Verbalized acceptance and understanding.  Pneumococcal Vaccine: Up to date  Screening Tests Health Maintenance  Topic Date Due  . INFLUENZA VACCINE  09/19/2017  . COLONOSCOPY  02/19/2026 (Originally 10/28/1993)  . TETANUS/TDAP  02/19/2026 (Originally [birth year]1962)  . COLON CANCER SCREENING ANNUAL FOBT  05/09/2018  . Hepatitis  C Screening  Completed  . PNA vac Low Risk Adult  Completed   Cancer Screenings:  Colorectal Screening: FOBT up to date, due 05/19/18.  Lung Cancer Screening: (Low Dose CT Chest recommended if Age 65-80 years, 30 pack-year currently smoking OR have quit w/in 15years.) does qualify, however is already scheduled for 05/02/18.   Additional Screening:  Hepatitis C Screening: Up to date  Vision Screening: Recommended annual ophthalmology exams for early detection of glaucoma and other disorders of the eye.  Dental Screening: Recommended annual dental exams for proper oral hygiene  Community Resource Referral:  CRR required this visit?  No       Plan:  I have personally reviewed and addressed the Medicare Annual Wellness questionnaire and have noted the following in the patient's chart:  A. Medical and social history B. Use of alcohol, tobacco or illicit drugs  C. Current medications and supplements D. Functional ability and status E.  Nutritional status F.  Physical activity G. Advance directives H. List of other physicians I.  Hospitalizations, surgeries, and ER visits in previous 12 months J.  Vitals K. Screenings such as hearing and vision if needed, cognitive and depression L. Referrals and appointments - none  In addition, I have reviewed and discussed with patient certain preventive protocols, quality metrics, and best practice recommendations. A written personalized care plan for preventive services as well as general preventive health recommendations were provided to patient.  See attached scanned questionnaire for additional information.   Signed,  Hyacinth Meeker, LPN Nurse Health Advisor   Nurse Recommendations: Pt declined the influenza and tetanus vaccine today.

## 2018-04-25 ENCOUNTER — Encounter: Payer: Self-pay | Admitting: *Deleted

## 2018-04-28 ENCOUNTER — Encounter: Payer: Self-pay | Admitting: Family Medicine

## 2018-05-02 ENCOUNTER — Other Ambulatory Visit: Payer: Self-pay

## 2018-05-02 ENCOUNTER — Ambulatory Visit
Admission: RE | Admit: 2018-05-02 | Discharge: 2018-05-02 | Disposition: A | Discharge status: Home / Self Care | Payer: Medicare Other | Source: Ambulatory Visit | Attending: Nurse Practitioner | Admitting: Nurse Practitioner

## 2018-05-02 DIAGNOSIS — Z122 Encounter for screening for malignant neoplasm of respiratory organs: Secondary | ICD-10-CM | POA: Insufficient documentation

## 2018-05-02 DIAGNOSIS — Z87891 Personal history of nicotine dependence: Secondary | ICD-10-CM | POA: Insufficient documentation

## 2018-05-07 ENCOUNTER — Encounter: Payer: Self-pay | Admitting: *Deleted

## 2018-05-07 ENCOUNTER — Encounter: Payer: Self-pay | Admitting: Family Medicine

## 2018-05-27 ENCOUNTER — Encounter: Payer: Medicare Other | Admitting: Family Medicine

## 2018-07-28 ENCOUNTER — Ambulatory Visit (INDEPENDENT_AMBULATORY_CARE_PROVIDER_SITE_OTHER): Payer: Medicare Other | Admitting: Family Medicine

## 2018-07-28 ENCOUNTER — Other Ambulatory Visit: Payer: Self-pay

## 2018-07-28 ENCOUNTER — Encounter: Payer: Self-pay | Admitting: Family Medicine

## 2018-07-28 VITALS — BP 142/76 | HR 76 | Temp 98.4°F | Resp 18 | Ht 66.0 in | Wt 179.0 lb

## 2018-07-28 DIAGNOSIS — Z87891 Personal history of nicotine dependence: Secondary | ICD-10-CM

## 2018-07-28 DIAGNOSIS — E781 Pure hyperglyceridemia: Secondary | ICD-10-CM

## 2018-07-28 DIAGNOSIS — I700 Atherosclerosis of aorta: Secondary | ICD-10-CM

## 2018-07-28 DIAGNOSIS — Z0000 Encounter for general adult medical examination without abnormal findings: Secondary | ICD-10-CM | POA: Diagnosis not present

## 2018-07-28 DIAGNOSIS — I10 Essential (primary) hypertension: Secondary | ICD-10-CM

## 2018-07-28 DIAGNOSIS — I491 Atrial premature depolarization: Secondary | ICD-10-CM | POA: Diagnosis not present

## 2018-07-28 DIAGNOSIS — J439 Emphysema, unspecified: Secondary | ICD-10-CM | POA: Diagnosis not present

## 2018-07-28 DIAGNOSIS — Z1211 Encounter for screening for malignant neoplasm of colon: Secondary | ICD-10-CM

## 2018-07-28 DIAGNOSIS — Z125 Encounter for screening for malignant neoplasm of prostate: Secondary | ICD-10-CM

## 2018-07-28 NOTE — Progress Notes (Signed)
Patient: Drew Russell, Male    DOB: 05/22/1945, 73 y.o.   MRN: [id number] Visit Date: 07/28/2018  Today's Provider: Mila Merryonald Fisher, MD   Chief Complaint  Patient presents with  . Annual Exam  . Hypertension  . Hyperlipidemia  . Coronary Artery Disease   Subjective:     Complete Physical Drew Russell is a 73 y.o. male. He feels well. He reports exercising daily. He reports he is sleeping fairly well.  -----------------------------------------------------------   Lipid/Cholesterol, Follow-up:   Last seen for this1 years ago.  Management changes since that visit include none. . Last Lipid Panel:    Component Value Date/Time   CHOL 98 (L) 05/08/2017 1618   TRIG 81 05/08/2017 1618   HDL 38 (L) 05/08/2017 1618   CHOLHDL 2.6 05/08/2017 1618   LDLCALC 44 05/08/2017 1618    Risk factors for vascular disease include hypercholesterolemia and hypertension  He reports good compliance with treatment. He is not having side effects.  Current symptoms include none and have been stable. Weight trend: decreasing steadily Prior visit with dietician: no Current diet: well balanced Current exercise: walking  Wt Readings from Last 3 Encounters:  07/28/18 179 lb (81.2 kg)  05/02/18 185 lb (83.9 kg)  04/23/18 185 lb (83.9 kg)    -------------------------------------------------------------------  Hypertension, follow-up:  BP Readings from Last 3 Encounters:  07/28/18 (!) 152/78  04/23/18 112/60  05/08/17 120/84    He was last seen for hypertension 1 years ago.  BP at that visit was 120/84. Management since that visit includes no changes. He reports good compliance with treatment. He is not having side effects.  He is exercising. He is adherent to low salt diet.   Outside blood pressures are not being checked. He is experiencing none.  Patient denies chest pain, chest pressure/discomfort, claudication, dyspnea, exertional chest pressure/discomfort, fatigue,  irregular heart beat, lower extremity edema, near-syncope, orthopnea, palpitations, paroxysmal nocturnal dyspnea, syncope and tachypnea.   Cardiovascular risk factors include advanced age (older than 4655 for men, 4865 for women), dyslipidemia, hypertension and male gender.  Use of agents associated with hypertension: NSAIDS.     Weight trend: decreasing steadily Wt Readings from Last 3 Encounters:  07/28/18 179 lb (81.2 kg)  05/02/18 185 lb (83.9 kg)  04/23/18 185 lb (83.9 kg)    Current diet: well balanced  ------------------------------------------------------------------------  Follow up for CAD:  The patient was last seen for this 1 years ago. Changes made at last visit include none.  He reports good compliance with treatment. He feels that condition is Unchanged. He is not having side effects.   ------------------------------------------------------------------------------------  Follow up for Atrial Arrhythmia:  The patient was last seen for this 1 years ago. Changes made at last visit include none; patient was referred to Cardiology.  He reports good compliance with treatment. He feels that condition is Unchanged. He is not having side effects.   ------------------------------------------------------------------------------------  Review of Systems  Constitutional: Negative for appetite change, chills, fatigue and fever.  HENT: Negative for congestion, ear pain, hearing loss, nosebleeds and trouble swallowing.   Eyes: Negative for pain and visual disturbance.  Respiratory: Negative for cough, chest tightness and shortness of breath.   Cardiovascular: Negative for chest pain, palpitations and leg swelling.  Gastrointestinal: Negative for abdominal pain, blood in stool, constipation, diarrhea, nausea and vomiting.  Endocrine: Negative for polydipsia, polyphagia and polyuria.  Genitourinary: Negative for dysuria and flank pain.  Musculoskeletal: Negative for  arthralgias, back pain,  joint swelling, myalgias and neck stiffness.  Skin: Negative for color change, rash and wound.  Neurological: Negative for dizziness, tremors, seizures, speech difficulty, weakness, light-headedness and headaches.  Psychiatric/Behavioral: Negative for behavioral problems, confusion, decreased concentration, dysphoric mood and sleep disturbance. The patient is not nervous/anxious.   All other systems reviewed and are negative.   Social History   Socioeconomic History  . Marital status: Single    Spouse name: Not on file  . Number of children: 0  . Years of education: 8th grade  . Highest education level: 8th grade  Occupational History  . Occupation: Retired  Engineer, productionocial Needs  . Financial resource strain: Not hard at all  . Food insecurity:    Worry: Never true    Inability: Never true  . Transportation needs:    Medical: No    Non-medical: No  Tobacco Use  . Smoking status: Former Smoker    Packs/day: 1.00    Years: 50.00    Pack years: 50.00    Types: Cigarettes    Last attempt to quit: 01/01/2014    Years since quitting: 4.5  . Smokeless tobacco: Never Used  . Tobacco comment: smoked 1ppd 50 years. Quit 2015  Substance and Sexual Activity  . Alcohol use: No    Alcohol/week: 0.0 standard drinks    Comment: Previous heavy drinker  . Drug use: No  . Sexual activity: Not on file  Lifestyle  . Physical activity:    Days per week: 0 days    Minutes per session: 0 min  . Stress: Not at all  Relationships  . Social connections:    Talks on phone: Patient refused    Gets together: Patient refused    Attends religious service: Patient refused    Active member of club or organization: Patient refused    Attends meetings of clubs or organizations: Patient refused    Relationship status: Patient refused  . Intimate partner violence:    Fear of current or ex partner: Patient refused    Emotionally abused: Patient refused    Physically abused: Patient  refused    Forced sexual activity: Patient refused  Other Topics Concern  . Not on file  Social History Narrative  . Not on file    Past Medical History:  Diagnosis Date  . History of chicken pox   . History of measles as a child   . History of mumps as a child   . Hypertension      Patient Active Problem List   Diagnosis Date Noted  . Emphysema lung (HCC) 05/06/2017  . Aortic atherosclerosis (HCC) 04/27/2016  . Coronary artery disease 09/16/2014  . Essential (primary) hypertension 09/16/2014  . Heartburn 09/16/2014  . Hypertriglyceridemia 09/16/2014  . History of smoking 30 or more pack years 09/16/2014    Past Surgical History:  Procedure Laterality Date  . [facility]  . Cardiac Catherterization     per pt had angioplast in 1995, no stent  . CATARACT EXTRACTION Bilateral 2007    His family history includes Colon cancer in his sister; Diabetes in his sister and sister; Heart attack in his father; Heart disease in his father; Stroke in his mother.   Current Outpatient Medications:  .  aspirin EC 81 MG tablet, Take 81 mg by mouth daily., Disp: , Rfl:  .  atorvastatin (LIPITOR) 20 MG tablet, TAKE 1 TABLET BY MOUTH EVERY DAY, Disp: 90 tablet, Rfl: 4 .  Omega-3  Fatty Acids (FISH OIL) 1000 MG CAPS, Take 1,200 mg by mouth daily. , Disp: , Rfl:   Patient Care Team: Birdie Sons, MD as PCP - General (Family Medicine)     Objective:    Vitals: BP (!) 142/76   Pulse 76   Temp 98.4 F (36.9 C) (Oral)   Resp 18   Ht 5\' 6"  (1.676 m)   Wt 179 lb (81.2 kg)   SpO2 99% Comment: room air  BMI 28.89 kg/m   Physical Exam   General Appearance:    Alert, cooperative, no distress, appears stated age  Head:    Normocephalic, without obvious abnormality, atraumatic  Eyes:    PERRL, conjunctiva/corneas clear, EOM's intact, fundi    benign, both eyes       Ears:    Normal TM's and external ear canals, both ears  Nose:   Nares  normal, septum midline, mucosa normal, no drainage   or sinus tenderness  Throat:   Lips, mucosa, and tongue normal; teeth and gums normal  Neck:   Supple, symmetrical, trachea midline, no adenopathy;       thyroid:  No enlargement/tenderness/nodules; no carotid   bruit or JVD  Back:     Symmetric, no curvature, ROM normal, no CVA tenderness  Lungs:     Clear to auscultation bilaterally, respirations unlabored  Chest wall:    No tenderness or deformity  Heart:    Regularly irregular couplets,  S1 and S2 normal, no murmur, rub   or gallop  Abdomen:     Soft, non-tender, bowel sounds active all four quadrants,    no masses, no organomegaly  Genitalia:    deferred  Rectal:    deferred  Extremities:   Extremities normal, atraumatic, no cyanosis or edema  Pulses:   2+ and symmetric all extremities  Skin:   Skin color, texture, turgor normal, no rashes or lesions  Lymph nodes:   Cervical, supraclavicular, and axillary nodes normal  Neurologic:   CNII-XII intact. Normal strength, sensation and reflexes      throughout    Activities of Daily Living In your present state of health, do you have any difficulty performing the following activities: 04/23/2018  Hearing? N  Vision? N  Comment Pt wears eye glasses daily.   Difficulty concentrating or making decisions? N  Walking or climbing stairs? N  Dressing or bathing? N  Doing errands, shopping? Y  Comment Does not drive.   Preparing Food and eating ? N  Using the Toilet? N  In the past six months, have you accidently leaked urine? N  Do you have problems with loss of bowel control? N  Managing your Medications? N  Managing your Finances? N  Housekeeping or managing your Housekeeping? N  Some recent data might be hidden    Fall Risk Assessment Fall Risk  04/23/2018 04/22/2017 04/18/2016 09/17/2014  Falls in the past year? 0 No No No     Depression Screen PHQ 2/9 Scores 04/23/2018 04/22/2017 04/22/2017 04/18/2016  PHQ - 2 Score 0 0 0 0  PHQ- 9  Score - 0 - 0    6CIT Screen 04/23/2018  What Year? 0 points  What month? 0 points  What time? 3 points  Count back from 20 0 points  Months in reverse 0 points  Repeat phrase 0 points  Total Score 3       Assessment & Plan:    Annual Physical Reviewed patient's Family Medical History Reviewed and updated  list of patient's medical providers Assessment of cognitive impairment was done Assessed patient's functional ability Established a written schedule for health screening services Health Risk Assessent Completed and Reviewed  Exercise Activities and Dietary recommendations Goals    . Exercise 3x per week (30 min per time)     Recommend exercising for 3 days a week for 30 minutes at a time.     . Increase water intake     Starting 04/18/16, I will increase my water intake to 4 glasses a day.       Immunization History  Administered Date(s) Administered  . Influenza, High Dose Seasonal PF 04/18/2016  . Pneumococcal Conjugate-13 06/12/2013  . Pneumococcal Polysaccharide-23 04/09/2012    Health Maintenance  Topic Date Due  . COLON CANCER SCREENING ANNUAL FOBT  05/09/2018  . COLONOSCOPY  02/19/2026 (Originally 10/28/1993)  . TETANUS/TDAP  02/19/2026 (Originally 10/29/1962)  . INFLUENZA VACCINE  09/20/2018  . Hepatitis C Screening  Completed  . PNA vac Low Risk Adult  Completed     Discussed health benefits of physical activity, and encouraged him to engage in regular exercise appropriate for his age and condition.    ------------------------------------------------------------------------------------------------------------  1. Annual physical exam Generally doing well. Recommend routine eye exam.   2. History of smoking 30 or more pack years Quit x 5 years. Continue annual LDCT  3. Hypertriglyceridemia He is tolerating atorvastatin well with no adverse effects.   - Comprehensive metabolic panel - Lipid panel  4. Pulmonary emphysema, unspecified emphysema type  (HCC) Asymptomatic, has quit smoking.   5. Aortic atherosclerosis (HCC) Asymptomatic. Compliant with medication.  Continue aggressive risk factor modification.    6. Essential (primary) hypertension Borderline BP, avoid sodium in diet.   7. Colon cancer screening Sent with iFOBT card  8. Prostate cancer screening  - PSA  9. PAC (premature atrial contraction) Asymptomatic.  - Magnesium - TSH  Mila Merryonald Fisher, MD  [facility]

## 2018-07-28 NOTE — Patient Instructions (Addendum)
.   Please review the attached list of medications and notify my office if there are any errors.   . Please bring all of your medications to every appointment so we can make sure that our medication list is the same as yours.   . You are due for a Tdap (tetanus-diptheria-pertussis vaccine) which protects you from tetanus and whooping cough. Please check with your insurance plan or pharmacy regarding coverage for this vaccine.    The CDC recommends two doses of Shingrix (the shingles vaccine) separated by 2 to 6 months for adults age 56 years and older. I recommend checking with your insurance plan regarding coverage for this vaccine.   . Please contact your eyecare professional to schedule a routine eye exam. I recommend seeing Dr. Jomarie Longs at [phone] or the [facility] at [phone]

## 2018-07-29 ENCOUNTER — Telehealth: Payer: Self-pay

## 2018-07-29 LAB — COMPREHENSIVE METABOLIC PANEL
ALT: 15 IU/L (ref 0–44)
AST: 24 IU/L (ref 0–40)
Albumin/Globulin Ratio: 1.9 (ref 1.2–2.2)
Albumin: 4.5 g/dL (ref 3.7–4.7)
Alkaline Phosphatase: 69 IU/L (ref 39–117)
BUN/Creatinine Ratio: 16 (ref 10–24)
BUN: 20 mg/dL (ref 8–27)
Bilirubin Total: 1.2 mg/dL (ref 0.0–1.2)
CO2: 23 mmol/L (ref 20–29)
Calcium: 9.3 mg/dL (ref 8.6–10.2)
Chloride: 102 mmol/L (ref 96–106)
Creatinine, Ser: 1.25 mg/dL (ref 0.76–1.27)
GFR calc Af Amer: 65 mL/min/{1.73_m2} (ref >59)
GFR calc non Af Amer: 56 mL/min/{1.73_m2} — ABNORMAL LOW (ref >59)
Globulin, Total: 2.4 g/dL (ref 1.5–4.5)
Glucose: 88 mg/dL (ref 65–99)
Potassium: 4.5 mmol/L (ref 3.5–5.2)
Sodium: 142 mmol/L (ref 134–144)
Total Protein: 6.9 g/dL (ref 6.0–8.5)

## 2018-07-29 LAB — TSH: TSH: 2.39 u[IU]/mL (ref 0.450–4.500)

## 2018-07-29 LAB — LIPID PANEL
Chol/HDL Ratio: 2.2 ratio (ref 0.0–5.0)
Cholesterol, Total: 84 mg/dL — ABNORMAL LOW (ref 100–199)
HDL: 38 mg/dL — ABNORMAL LOW (ref >39)
LDL Calculated: 34 mg/dL (ref 0–99)
Triglycerides: 58 mg/dL (ref 0–149)
VLDL Cholesterol Cal: 12 mg/dL (ref 5–40)

## 2018-07-29 LAB — MAGNESIUM: Magnesium: 2.1 mg/dL (ref 1.6–2.3)

## 2018-07-29 LAB — PSA: Prostate Specific Ag, Serum: 0.7 ng/mL (ref 0.0–4.0)

## 2018-07-29 NOTE — Telephone Encounter (Signed)
-----   Message from Birdie Sons, MD sent at 07/29/2018  7:14 AM EDT ----- Labs all normal. Cholesterol is very well controlled at 84. Continue current medications.  Check labs yearly.

## 2018-07-29 NOTE — Telephone Encounter (Signed)
Pt advised.   Thanks,   -Alexandar Weisenberger  

## 2018-07-29 NOTE — Telephone Encounter (Signed)
LMTCB 07/29/2018  Thanks,   -Laura  

## 2018-11-16 ENCOUNTER — Other Ambulatory Visit: Payer: Self-pay | Admitting: Family Medicine

## 2018-11-16 DIAGNOSIS — E781 Pure hyperglyceridemia: Secondary | ICD-10-CM

## 2019-01-23 DIAGNOSIS — Z719 Counseling, unspecified: Secondary | ICD-10-CM | POA: Diagnosis not present

## 2019-05-01 ENCOUNTER — Telehealth: Payer: Self-pay | Admitting: *Deleted

## 2019-05-01 DIAGNOSIS — Z87891 Personal history of nicotine dependence: Secondary | ICD-10-CM

## 2019-05-01 NOTE — Telephone Encounter (Signed)
(  05/01/19) Left message for pt to notify them that it is time to schedule annual low dose lung cancer screening CT scan. Instructed patient to call back to verify information prior to the scan being scheduled °SRW °  ° ° °

## 2019-05-01 NOTE — Telephone Encounter (Signed)
Patient has been notified that annual lung cancer screening low dose CT scan is due currently or will be in near future. Confirmed that patient is within the age range of 55-77, and asymptomatic, (no signs or symptoms of lung cancer). Patient denies illness that would prevent curative treatment for lung cancer if found. Verified smoking history, (former, quit 12/22/13, 50 pack year). The shared decision making visit was done 04/24/16. Patient is agreeable for CT scan being scheduled.

## 2019-05-05 ENCOUNTER — Ambulatory Visit
Admission: RE | Admit: 2019-05-05 | Discharge: 2019-05-05 | Disposition: A | Discharge status: Home / Self Care | Payer: Medicare Other | Source: Ambulatory Visit | Attending: Nurse Practitioner | Admitting: Nurse Practitioner

## 2019-05-05 ENCOUNTER — Other Ambulatory Visit: Payer: Self-pay

## 2019-05-05 DIAGNOSIS — Z87891 Personal history of nicotine dependence: Secondary | ICD-10-CM | POA: Diagnosis not present

## 2019-05-07 ENCOUNTER — Encounter: Payer: Self-pay | Admitting: *Deleted

## 2020-02-14 ENCOUNTER — Other Ambulatory Visit: Payer: Self-pay | Admitting: Family Medicine

## 2020-02-14 DIAGNOSIS — E781 Pure hyperglyceridemia: Secondary | ICD-10-CM

## 2020-04-21 ENCOUNTER — Other Ambulatory Visit: Payer: Self-pay | Admitting: *Deleted

## 2020-04-21 ENCOUNTER — Telehealth: Payer: Self-pay | Admitting: *Deleted

## 2020-04-21 DIAGNOSIS — Z87891 Personal history of nicotine dependence: Secondary | ICD-10-CM

## 2020-04-21 DIAGNOSIS — Z122 Encounter for screening for malignant neoplasm of respiratory organs: Secondary | ICD-10-CM

## 2020-04-21 NOTE — Progress Notes (Signed)
Contacted and scheduled for annual lung screening scan. Patient is a former smoker, quit 12/22/13, 50 pack year history.

## 2020-04-21 NOTE — Telephone Encounter (Signed)
Attempted to contact patient to schedule lung screening. Left message to call Shawn at [phone]. 

## 2020-05-06 ENCOUNTER — Ambulatory Visit
Admission: RE | Admit: 2020-05-06 | Discharge: 2020-05-06 | Disposition: A | Discharge status: Home / Self Care | Payer: Medicare Other | Source: Ambulatory Visit | Attending: Oncology | Admitting: Oncology

## 2020-05-06 ENCOUNTER — Other Ambulatory Visit: Payer: Self-pay

## 2020-05-06 DIAGNOSIS — Z122 Encounter for screening for malignant neoplasm of respiratory organs: Secondary | ICD-10-CM | POA: Diagnosis not present

## 2020-05-06 DIAGNOSIS — Z87891 Personal history of nicotine dependence: Secondary | ICD-10-CM | POA: Diagnosis not present

## 2020-05-08 ENCOUNTER — Other Ambulatory Visit: Payer: Self-pay | Admitting: Family Medicine

## 2020-05-08 DIAGNOSIS — E781 Pure hyperglyceridemia: Secondary | ICD-10-CM

## 2020-05-08 NOTE — Telephone Encounter (Signed)
Requested medication (s) are due for refill today: yes  Requested medication (s) are on the active medication list: yes  Last refill:  02/14/20  Future visit scheduled: no  Notes to clinic:  called pt and LM on VM to call office to make appt - call back number provided   Requested Prescriptions  Pending Prescriptions Disp Refills   atorvastatin (LIPITOR) 20 MG tablet [Pharmacy Med Name: ATORVASTATIN 20 MG TABLET] 90 tablet 0    Sig: TAKE 1 TABLET BY MOUTH EVERY DAY      Cardiovascular:  Antilipid - Statins Failed - 05/08/2020 12:45 AM      Failed - Total Cholesterol in normal range and within 360 days    Cholesterol, Total  Date Value Ref Range Status  07/28/2018 84 (L) 100 - 199 mg/dL Final          Failed - LDL in normal range and within 360 days    LDL Calculated  Date Value Ref Range Status  07/28/2018 34 0 - 99 mg/dL Final          Failed - HDL in normal range and within 360 days    HDL  Date Value Ref Range Status  07/28/2018 38 (L) >39 mg/dL Final          Failed - Triglycerides in normal range and within 360 days    Triglycerides  Date Value Ref Range Status  07/28/2018 58 0 - 149 mg/dL Final          Failed - Valid encounter within last 12 months    Recent Outpatient Visits           1 year ago Annual physical exam   [facility] Malva Limes, MD   3 years ago Annual physical exam   [facility] Malva Limes, MD   3 years ago Hypertriglyceridemia   [facility] Malva Limes, MD   4 years ago Annual physical exam   [facility] Malva Limes, MD   5 years ago History of tobacco abuse   [facility] Fisher, Demetrios Isaacs, MD                Passed - Patient is not pregnant

## 2020-05-13 ENCOUNTER — Encounter: Payer: Self-pay | Admitting: *Deleted

## 2020-05-16 ENCOUNTER — Encounter: Payer: Self-pay | Admitting: Family Medicine

## 2020-05-16 ENCOUNTER — Ambulatory Visit (INDEPENDENT_AMBULATORY_CARE_PROVIDER_SITE_OTHER): Payer: Medicare Other | Admitting: Family Medicine

## 2020-05-16 ENCOUNTER — Other Ambulatory Visit: Payer: Self-pay

## 2020-05-16 VITALS — BP 149/70 | HR 66 | Temp 98.1°F | Wt 170.6 lb

## 2020-05-16 DIAGNOSIS — Z23 Encounter for immunization: Secondary | ICD-10-CM | POA: Diagnosis not present

## 2020-05-16 DIAGNOSIS — H9190 Unspecified hearing loss, unspecified ear: Secondary | ICD-10-CM

## 2020-05-16 DIAGNOSIS — Z289 Immunization not carried out for unspecified reason: Secondary | ICD-10-CM

## 2020-05-16 DIAGNOSIS — I2510 Atherosclerotic heart disease of native coronary artery without angina pectoris: Secondary | ICD-10-CM

## 2020-05-16 DIAGNOSIS — Z125 Encounter for screening for malignant neoplasm of prostate: Secondary | ICD-10-CM | POA: Diagnosis not present

## 2020-05-16 DIAGNOSIS — I10 Essential (primary) hypertension: Secondary | ICD-10-CM

## 2020-05-16 DIAGNOSIS — E781 Pure hyperglyceridemia: Secondary | ICD-10-CM

## 2020-05-16 MED ORDER — SHINGRIX 50 MCG/0.5ML IM SUSR: 0.5000 mL | Freq: Once | INTRAMUSCULAR | 0 refills | Status: AC

## 2020-05-16 MED ORDER — TETANUS-DIPHTH-ACELL PERTUSSIS 5-2.5-18.5 LF-MCG/0.5 IM SUSY: 0.5000 mL | PREFILLED_SYRINGE | Freq: Once | INTRAMUSCULAR | 0 refills | Status: AC

## 2020-05-16 NOTE — Progress Notes (Signed)
Established patient visit   Patient: Drew Russell   DOB: 1945-10-10   75 y.o. Male  MRN: [id number] Visit Date: 05/16/2020  Today's healthcare provider: Mila Merry, MD   Chief Complaint  Patient presents with  . Hyperlipidemia  I,Porsha C McClurkin,acting as a scribe for Mila Merry, MD.,have documented all relevant documentation on the behalf of Mila Merry, MD,as directed by  Mila Merry, MD while in the presence of Mila Merry, MD.  Subjective    HPI  Lipid/Cholesterol, Follow-up  Last lipid panel Other pertinent labs  Lab Results  Component Value Date   CHOL 84 (L) 07/28/2018   HDL 38 (L) 07/28/2018   LDLCALC 34 07/28/2018   TRIG 58 07/28/2018   CHOLHDL 2.2 07/28/2018   Lab Results  Component Value Date   ALT 15 07/28/2018   AST 24 07/28/2018   TSH 2.390 07/28/2018     He was last seen for this 18 months ago.  Management since that visit includes continue current medication.  He reports good compliance with treatment. He is not having side effects.   Symptoms: No chest pain No chest pressure/discomfort  No dyspnea No lower extremity edema  No numbness or tingling of extremity No orthopnea  No palpitations No paroxysmal nocturnal dyspnea  No speech difficulty No syncope   Current diet: well balanced, low salt Current exercise: walking  The ASCVD Risk score Denman George DC Jr., et al., 2013) failed to calculate for the following reasons:   The valid total cholesterol range is 130 to 320 mg/dL  ---------------------------------------------------------------------------------------------------      Medications: Outpatient Medications Prior to Visit  Medication Sig  . aspirin EC 81 MG tablet Take 81 mg by mouth daily.  Marland Kitchen atorvastatin (LIPITOR) 20 MG tablet Take 1 tablet (20 mg total) by mouth daily.  . Omega-3 Fatty Acids (FISH OIL) 1000 MG CAPS Take 1,200 mg by mouth daily.    No facility-administered medications prior to visit.     Review of Systems  Constitutional: Negative for appetite change, chills and fatigue.  Respiratory: Negative for chest tightness and shortness of breath.   Cardiovascular: Negative for chest pain, palpitations and leg swelling.  Hematological: Does not bruise/bleed easily.       Objective    BP (!) 149/70 (BP Location: Left Arm, Patient Position: Sitting, Cuff Size: Normal)   Pulse 66   Temp 98.1 F (36.7 C) (Temporal)   Wt 170 lb 9.6 oz (77.4 kg)   SpO2 99%   BMI 27.54 kg/m     Physical Exam   General: Appearance:     Overweight male in no acute distress  Eyes:    PERRL, conjunctiva/corneas clear, EOM's intact       Lungs:     Clear to auscultation bilaterally, respirations unlabored  Heart:    Normal heart rate. Normal rhythm. No murmurs, rubs, or gallops.   MS:   All extremities are intact.   Neurologic:   Awake, alert, oriented x 3. No apparent focal neurological           defect.         Assessment & Plan     1. Essential (primary) hypertension Diet controlled. Consider adding amlodipine if labs normal.   2. Prostate cancer screening  - PSA Total (Reflex To Free) ([facility] only)  3. Hypertriglyceridemia He is tolerating atorvastatin well with no adverse effects.   - CBC - Lipid panel - Comprehensive metabolic panel  4. Hearing loss,  unspecified hearing loss type, unspecified laterality   5. Coronary artery disease involving native coronary artery of native heart without angina pectoris Asymptomatic. Compliant with medication.  Continue aggressive risk factor modification.    6. Prescription for Shingrix. Vaccine not administered in office.   - Zoster Vaccine Adjuvanted [facility]) injection; Inject 0.5 mLs into the muscle once for 1 dose.  Dispense: 0.5 mL; Refill: 0  7. Prescription for Tdap. Vaccine not administered in office.   - Tdap (BOOSTRIX) 5-2.5-18.5 LF-MCG/0.5 injection; Inject 0.5 mLs into the muscle once for 1 dose.  Dispense: 0.5 mL;  Refill: 0   No follow-ups on file.      The entirety of the information documented in the History of Present Illness, Review of Systems and Physical Exam were personally obtained by me. Portions of this information were initially documented by the CMA and reviewed by me for thoroughness and accuracy.      Mila Merry, MD  [facility] [phone] (phone) [phone] (fax)  [facility]

## 2020-05-16 NOTE — Patient Instructions (Addendum)
   Try OTC Prilosec or Pepcid to help with GERD

## 2020-05-17 LAB — CBC
Hematocrit: 41.4 % (ref 37.5–51.0)
Hemoglobin: 14.2 g/dL (ref 13.0–17.7)
MCH: 32.7 pg (ref 26.6–33.0)
MCHC: 34.3 g/dL (ref 31.5–35.7)
MCV: 95 fL (ref 79–97)
Platelets: 205 10*3/uL (ref 150–450)
RBC: 4.34 x10E6/uL (ref 4.14–5.80)
RDW: 13.1 % (ref 11.6–15.4)
WBC: 5.9 10*3/uL (ref 3.4–10.8)

## 2020-05-17 LAB — COMPREHENSIVE METABOLIC PANEL
ALT: 16 IU/L (ref 0–44)
AST: 24 IU/L (ref 0–40)
Albumin/Globulin Ratio: 1.8 (ref 1.2–2.2)
Albumin: 4.6 g/dL (ref 3.7–4.7)
Alkaline Phosphatase: 82 IU/L (ref 44–121)
BUN/Creatinine Ratio: 12 (ref 10–24)
BUN: 16 mg/dL (ref 8–27)
Bilirubin Total: 1 mg/dL (ref 0.0–1.2)
CO2: 23 mmol/L (ref 20–29)
Calcium: 9.4 mg/dL (ref 8.6–10.2)
Chloride: 102 mmol/L (ref 96–106)
Creatinine, Ser: 1.39 mg/dL — ABNORMAL HIGH (ref 0.76–1.27)
Globulin, Total: 2.5 g/dL (ref 1.5–4.5)
Glucose: 96 mg/dL (ref 65–99)
Potassium: 4.4 mmol/L (ref 3.5–5.2)
Sodium: 141 mmol/L (ref 134–144)
Total Protein: 7.1 g/dL (ref 6.0–8.5)
eGFR: 53 mL/min/{1.73_m2} — ABNORMAL LOW (ref >59)

## 2020-05-17 LAB — LIPID PANEL
Chol/HDL Ratio: 2.2 ratio (ref 0.0–5.0)
Cholesterol, Total: 97 mg/dL — ABNORMAL LOW (ref 100–199)
HDL: 45 mg/dL (ref >39)
LDL Chol Calc (NIH): 38 mg/dL (ref 0–99)
Triglycerides: 65 mg/dL (ref 0–149)
VLDL Cholesterol Cal: 14 mg/dL (ref 5–40)

## 2020-05-17 LAB — PSA TOTAL (REFLEX TO FREE): Prostate Specific Ag, Serum: 0.9 ng/mL (ref 0.0–4.0)

## 2020-05-18 ENCOUNTER — Other Ambulatory Visit: Payer: Self-pay

## 2020-05-18 DIAGNOSIS — I10 Essential (primary) hypertension: Secondary | ICD-10-CM

## 2020-05-18 MED ORDER — AMLODIPINE BESYLATE 2.5 MG PO TABS: 2.5000 mg | ORAL_TABLET | Freq: Every day | ORAL | 1 refills | Status: DC

## 2020-05-18 NOTE — Telephone Encounter (Signed)
Per Dr. Sherrie Mustache, sent in amlodipine 2.5mg . see result note.

## 2020-07-22 ENCOUNTER — Encounter: Payer: Self-pay | Admitting: Family Medicine

## 2020-07-22 ENCOUNTER — Ambulatory Visit (INDEPENDENT_AMBULATORY_CARE_PROVIDER_SITE_OTHER): Payer: Medicare Other | Admitting: Family Medicine

## 2020-07-22 ENCOUNTER — Other Ambulatory Visit: Payer: Self-pay

## 2020-07-22 VITALS — BP 130/80 | HR 60 | Temp 97.9°F | Resp 16 | Wt 166.0 lb

## 2020-07-22 DIAGNOSIS — I10 Essential (primary) hypertension: Secondary | ICD-10-CM | POA: Diagnosis not present

## 2020-07-22 NOTE — Progress Notes (Signed)
Established patient visit   Patient: Drew Russell   DOB: 1945-11-17   75 y.o. Male  MRN: [id number] Visit Date: 07/22/2020  Today's healthcare provider: Mila Merry, MD   Chief Complaint  Patient presents with  . Hypertension   Subjective    HPI  Hypertension, follow-up  BP Readings from Last 3 Encounters:  07/22/20 (!) 167/73  05/16/20 (!) 149/70  07/28/18 (!) 142/76   Wt Readings from Last 3 Encounters:  07/22/20 166 lb (75.3 kg)  05/16/20 170 lb 9.6 oz (77.4 kg)  05/06/20 179 lb (81.2 kg)     He was last seen for hypertension 2 months ago.  BP at that visit was 149/ 70. Management since that visit includes starting Amlodipine 2.5mg  daily.  He reports good compliance with treatment. He is not having side effects.  He is following a Regular diet. He is not exercising. He does not smoke.  Use of agents associated with hypertension: NSAIDS.   Outside blood pressures are not checked. Symptoms: No chest pain No chest pressure  No palpitations No syncope  No dyspnea No orthopnea  No paroxysmal nocturnal dyspnea No lower extremity edema   Pertinent labs: Lab Results  Component Value Date   CHOL 97 (L) 05/16/2020   HDL 45 05/16/2020   LDLCALC 38 05/16/2020   TRIG 65 05/16/2020   CHOLHDL 2.2 05/16/2020   Lab Results  Component Value Date   NA 141 05/16/2020   K 4.4 05/16/2020   CREATININE 1.39 (H) 05/16/2020   GFRNONAA 56 (L) 07/28/2018   GFRAA 65 07/28/2018   GLUCOSE 96 05/16/2020     The ASCVD Risk score (Goff DC Jr., et al., 2013) failed to calculate for the following reasons:   The valid total cholesterol range is 130 to 320 mg/dL   ---------------------------------------------------------------------------------------------------     Medications: Outpatient Medications Prior to Visit  Medication Sig  . amLODipine (NORVASC) 2.5 MG tablet Take 1 tablet (2.5 mg total) by mouth daily.  Marland Kitchen aspirin EC 81 MG tablet Take 81 mg by mouth daily.   Marland Kitchen atorvastatin (LIPITOR) 20 MG tablet Take 1 tablet (20 mg total) by mouth daily.  . Omega-3 Fatty Acids (FISH OIL) 1000 MG CAPS Take 1,200 mg by mouth daily.    No facility-administered medications prior to visit.    Review of Systems  Constitutional: Negative for appetite change, chills and fever.  Respiratory: Negative for chest tightness, shortness of breath and wheezing.   Cardiovascular: Negative for chest pain and palpitations.  Gastrointestinal: Negative for abdominal pain, nausea and vomiting.       Objective    BP 130/80   Pulse 60   Temp 97.9 F (36.6 C) (Temporal)   Resp 16   Wt 166 lb (75.3 kg)   BMI 26.79 kg/m      Body mass index is 26.79 kg/m.  Physical Exam   General: Appearance:     Overweight male in no acute distress  Eyes:    PERRL, conjunctiva/corneas clear, EOM's intact       Lungs:     Clear to auscultation bilaterally, respirations unlabored  Heart:    Normal heart rate. Normal rhythm. No murmurs, rubs, or gallops.   MS:   All extremities are intact.   Neurologic:   Awake, alert, oriented x 3. No apparent focal neurological           defect.         Assessment & Plan  1. Essential (primary) hypertension Improved with addition of amlodipine. Continue current medications.  Follow up in 4-5 months.         The entirety of the information documented in the History of Present Illness, Review of Systems and Physical Exam were personally obtained by me. Portions of this information were initially documented by the CMA and reviewed by me for thoroughness and accuracy.      Mila Merry, MD  [facility] [phone] (phone) [phone] (fax)  [facility]

## 2020-10-28 ENCOUNTER — Other Ambulatory Visit: Payer: Self-pay | Admitting: Family Medicine

## 2020-10-28 DIAGNOSIS — I10 Essential (primary) hypertension: Secondary | ICD-10-CM

## 2020-11-01 ENCOUNTER — Other Ambulatory Visit: Payer: Self-pay | Admitting: Family Medicine

## 2020-11-01 DIAGNOSIS — E781 Pure hyperglyceridemia: Secondary | ICD-10-CM

## 2020-12-26 ENCOUNTER — Other Ambulatory Visit: Payer: Self-pay

## 2020-12-26 ENCOUNTER — Ambulatory Visit (INDEPENDENT_AMBULATORY_CARE_PROVIDER_SITE_OTHER): Payer: Medicare Other | Admitting: Family Medicine

## 2020-12-26 ENCOUNTER — Encounter: Payer: Self-pay | Admitting: Family Medicine

## 2020-12-26 VITALS — BP 132/72 | HR 64 | Temp 98.2°F | Resp 16 | Wt 158.0 lb

## 2020-12-26 DIAGNOSIS — I10 Essential (primary) hypertension: Secondary | ICD-10-CM

## 2020-12-26 DIAGNOSIS — J439 Emphysema, unspecified: Secondary | ICD-10-CM

## 2020-12-26 DIAGNOSIS — I700 Atherosclerosis of aorta: Secondary | ICD-10-CM

## 2020-12-26 NOTE — Progress Notes (Signed)
Established patient visit   Patient: Drew Russell   DOB: 1945/06/26   75 y.o. Male  MRN: [id number] Visit Date: 12/26/2020  Today's healthcare provider: Lelon Huh, MD   Chief Complaint  Patient presents with   Hypertension   Subjective    HPI  Hypertension, follow-up  BP Readings from Last 3 Encounters:  12/26/20 (!) 144/72  07/22/20 130/80  05/16/20 (!) 149/70   Wt Readings from Last 3 Encounters:  12/26/20 158 lb (71.7 kg)  07/22/20 166 lb (75.3 kg)  05/16/20 170 lb 9.6 oz (77.4 kg)     He was last seen for hypertension 5 months ago.  BP at that visit was 130/80. Management since that visit includes no changes.  He reports fair compliance with treatment (patient reports missing a couple of doses of Amlodipine within the last month).  He is not having side effects.  He is following a Regular diet. He is exercising. He does not smoke.  Use of agents associated with hypertension: none.   Outside blood pressures are not checked. Symptoms: No chest pain No chest pressure  No palpitations No syncope  No dyspnea No orthopnea  No paroxysmal nocturnal dyspnea No lower extremity edema   Pertinent labs: Lab Results  Component Value Date   CHOL 97 (L) 05/16/2020   HDL 45 05/16/2020   LDLCALC 38 05/16/2020   TRIG 65 05/16/2020   CHOLHDL 2.2 05/16/2020   Lab Results  Component Value Date   NA 141 05/16/2020   K 4.4 05/16/2020   CREATININE 1.39 (H) 05/16/2020   EGFR 53 (L) 05/16/2020   GLUCOSE 96 05/16/2020   TSH 2.390 07/28/2018     The ASCVD Risk score (Arnett DK, et al., 2019) failed to calculate for the following reasons:   The valid total cholesterol range is 130 to 320 mg/dL   ---------------------------------------------------------------------------------------------------     Medications: Outpatient Medications Prior to Visit  Medication Sig   amLODipine (NORVASC) 2.5 MG tablet TAKE 1 TABLET BY MOUTH EVERY DAY   Omega-3 Fatty Acids  (FISH OIL) 1000 MG CAPS Take 1,200 mg by mouth daily.    aspirin EC 81 MG tablet Take 81 mg by mouth daily. (Patient not taking: Reported on 12/26/2020)   atorvastatin (LIPITOR) 20 MG tablet TAKE 1 TABLET BY MOUTH EVERY DAY (Patient not taking: Reported on 12/26/2020)   No facility-administered medications prior to visit.    Review of Systems  Constitutional:  Negative for appetite change, chills and fever.  Respiratory:  Negative for chest tightness, shortness of breath and wheezing.   Cardiovascular:  Negative for chest pain and palpitations.  Gastrointestinal:  Negative for abdominal pain, nausea and vomiting.      Objective    BP (!) 144/72 (BP Location: Right Arm, Patient Position: Sitting, Cuff Size: Normal)   Pulse 64   Temp 98.2 F (36.8 C) (Oral)   Resp 16   Wt 158 lb (71.7 kg)   SpO2 100% Comment: room air  BMI 25.50 kg/m    Physical Exam    General: Appearance:     Mildly obese male in no acute distress  Eyes:    PERRL, conjunctiva/corneas clear, EOM's intact       Lungs:     Clear to auscultation bilaterally, respirations unlabored  Heart:    Normal heart rate. Normal rhythm. No murmurs, rubs, or gallops.    MS:   All extremities are intact.    Neurologic:   Awake, alert,  oriented x 3. No apparent focal neurological defect.         Assessment & Plan     1. Essential (primary) hypertension Well controlled.  Continue current medications.  Currently off of statin. Will check at lipids at CPE in April    2. Pulmonary emphysema, unspecified emphysema type ([place]) Currently asymptomatic.   3. Aortic atherosclerosis (HCC) Asymptomatic. Lipids are good, but he has not started atorvastatin. Will reassess at upcoming CPE       The entirety of the information documented in the History of Present Illness, Review of Systems and Physical Exam were personally obtained by me. Portions of this information were initially documented by the CMA and reviewed by me for  thoroughness and accuracy.     Lelon Huh, MD  [facility] [phone] (phone) [phone] (fax)  [place]

## 2021-01-02 ENCOUNTER — Telehealth: Payer: Self-pay | Admitting: Family Medicine

## 2021-01-02 NOTE — Telephone Encounter (Signed)
Copied from CRM [id number]. Topic: Medicare AWV >> Jan 02, 2021 12:25 PM Claudette Laws R wrote: Reason for CRM:  Left message for patient to call back and schedule Medicare Annual Wellness Visit (AWV) in office.   If not able to come in office, please offer to do virtually or by telephone.   Last AWV: : 04/23/2018  Please schedule at anytime with [facility] Health Advisor.  If any questions, please contact me at [phone]

## 2021-01-30 ENCOUNTER — Other Ambulatory Visit: Payer: Self-pay | Admitting: Family Medicine

## 2021-01-30 DIAGNOSIS — I10 Essential (primary) hypertension: Secondary | ICD-10-CM

## 2021-01-31 ENCOUNTER — Other Ambulatory Visit: Payer: Self-pay | Admitting: Family Medicine

## 2021-01-31 DIAGNOSIS — E781 Pure hyperglyceridemia: Secondary | ICD-10-CM

## 2021-01-31 NOTE — Telephone Encounter (Signed)
Requested medication (s) are due for refill today: yes  Requested medication (s) are on the active medication list: yes  Last refill:  11/01/20  Future visit scheduled: 04/21/21  Notes to clinic:  FYI Chart notes that pt is not taking this med. OV note states he has not started it and it will be assessed at next visit.   Requested Prescriptions  Pending Prescriptions Disp Refills   atorvastatin (LIPITOR) 20 MG tablet [Pharmacy Med Name: ATORVASTATIN 20 MG TABLET] 90 tablet 0    Sig: TAKE 1 TABLET BY MOUTH EVERY DAY     Cardiovascular:  Antilipid - Statins Failed - 01/31/2021  1:27 AM      Failed - Total Cholesterol in normal range and within 360 days    Cholesterol, Total  Date Value Ref Range Status  05/16/2020 97 (L) 100 - 199 mg/dL Final          Passed - LDL in normal range and within 360 days    LDL Chol Calc (NIH)  Date Value Ref Range Status  05/16/2020 38 0 - 99 mg/dL Final          Passed - HDL in normal range and within 360 days    HDL  Date Value Ref Range Status  05/16/2020 45 >39 mg/dL Final          Passed - Triglycerides in normal range and within 360 days    Triglycerides  Date Value Ref Range Status  05/16/2020 65 0 - 149 mg/dL Final          Passed - Patient is not pregnant      Passed - Valid encounter within last 12 months    Recent Outpatient Visits           1 month ago Essential (primary) hypertension   [facility], Demetrios Isaacs, MD   6 months ago Essential (primary) hypertension   [facility], Demetrios Isaacs, MD   8 months ago Essential (primary) hypertension   [facility], Demetrios Isaacs, MD   2 years ago Annual physical exam   [facility] Malva Limes, MD   3 years ago Annual physical exam   [facility] Malva Limes, MD       Future Appointments             In 2 months Larae Grooms, NP [facility]   In 3 months  Fisher, Demetrios Isaacs, MD [facility], [facility]

## 2021-04-21 ENCOUNTER — Ambulatory Visit (INDEPENDENT_AMBULATORY_CARE_PROVIDER_SITE_OTHER): Payer: Medicare Other | Admitting: Nurse Practitioner

## 2021-04-21 ENCOUNTER — Ambulatory Visit: Payer: Medicare Other | Admitting: Nurse Practitioner

## 2021-04-21 ENCOUNTER — Encounter: Payer: Self-pay | Admitting: Nurse Practitioner

## 2021-04-21 ENCOUNTER — Other Ambulatory Visit: Payer: Self-pay

## 2021-04-21 VITALS — BP 159/73 | HR 71 | Temp 99.2°F | Ht 65.4 in | Wt 161.2 lb

## 2021-04-21 DIAGNOSIS — Z125 Encounter for screening for malignant neoplasm of prostate: Secondary | ICD-10-CM

## 2021-04-21 DIAGNOSIS — I10 Essential (primary) hypertension: Secondary | ICD-10-CM | POA: Diagnosis not present

## 2021-04-21 DIAGNOSIS — I700 Atherosclerosis of aorta: Secondary | ICD-10-CM | POA: Diagnosis not present

## 2021-04-21 DIAGNOSIS — J439 Emphysema, unspecified: Secondary | ICD-10-CM | POA: Diagnosis not present

## 2021-04-21 DIAGNOSIS — Z7689 Persons encountering health services in other specified circumstances: Secondary | ICD-10-CM

## 2021-04-21 DIAGNOSIS — E781 Pure hyperglyceridemia: Secondary | ICD-10-CM | POA: Diagnosis not present

## 2021-04-21 MED ORDER — ATORVASTATIN CALCIUM 20 MG PO TABS: 20.0000 mg | ORAL_TABLET | Freq: Every day | ORAL | 1 refills | Status: DC

## 2021-04-21 NOTE — Assessment & Plan Note (Addendum)
Quit smoking in 2015.  Controlled at this time.  Denies concerns.  Follow up in 3 months for reevaluation. ?

## 2021-04-21 NOTE — Assessment & Plan Note (Signed)
Chronic.  Controlled.  Continue with current medication regimen on Atorvastatin 20mg  daily.  Refills snet today.  Labs ordered today.  Return to clinic in 3 months for reevaluation.  Call sooner if concerns arise.  ? ?

## 2021-04-21 NOTE — Progress Notes (Signed)
? ?BP (!) 159/73 (BP Location: Right Arm, Cuff Size: Normal)   Pulse 71   Temp 99.2 ?F (37.3 ?C) (Oral)   Ht 5' 5.4" (1.661 m)   Wt 161 lb 3.2 oz (73.1 kg)   SpO2 97%   BMI 26.50 kg/m?   ? ?Subjective:  ? ? Patient ID: Drew Russell, male    DOB: 05/24/1945, 76 y.o.   MRN: [id number] ? ?HPI: ?Drew Russell is a 76 y.o. male ? ?Chief Complaint  ?Patient presents with  ? New Patient (Initial Visit)  ?  Patient used to see Dr. Caryn Section at [facility], no concerns for today   ? ? ?Patient presents to clinic to establish care with new PCP.  Introduced to Designer, jewellery role and practice setting.  All questions answered.  Discussed provider/patient relationship and expectations.  Patient reports a history of Hypertension and Elevated Cholesterol.  Quit smoking in 2015.   ? ? ?Patient denies a history of: Diabetes, Thyroid problems, Depression, Anxiety, Neurological problems, and Abdominal problems.  ? ?Active Ambulatory Problems  ?  Diagnosis Date Noted  ? Coronary artery disease 09/16/2014  ? Essential (primary) hypertension 09/16/2014  ? Heartburn 09/16/2014  ? Hypertriglyceridemia 09/16/2014  ? History of smoking 30 or more pack years 09/16/2014  ? Aortic atherosclerosis ([place]) 04/27/2016  ? Emphysema lung ([place]) 05/06/2017  ? ?Resolved Ambulatory Problems  ?  Diagnosis Date Noted  ? No Resolved Ambulatory Problems  ? ?Past Medical History:  ?Diagnosis Date  ? History of chicken pox   ? History of measles as a child   ? History of mumps as a child   ? Hyperlipidemia   ? Hypertension   ? ?Past Surgical History:  ?Procedure Laterality Date  ? ANGIOPLASTY  05/21/1993  ? [facility]  ? Cardiac Catherterization    ? per pt had angioplast in 1995, no stent  ? CATARACT EXTRACTION Bilateral 2007  ? ?Family History  ?Problem Relation Age of Onset  ? Stroke Mother   ? Heart disease Father   ? Heart attack Father   ? Colon cancer Sister   ? Diabetes Sister   ?     type 2  ? Diabetes Sister   ?     type 2   ? ? ? ?Review of Systems  ?Eyes:  Negative for visual disturbance.  ?Respiratory:  Negative for chest tightness and shortness of breath.   ?Cardiovascular:  Negative for chest pain, palpitations and leg swelling.  ?Neurological:  Negative for dizziness, light-headedness and headaches.  ? ?Per HPI unless specifically indicated above ? ?   ?Objective:  ?  ?BP (!) 159/73 (BP Location: Right Arm, Cuff Size: Normal)   Pulse 71   Temp 99.2 ?F (37.3 ?C) (Oral)   Ht 5' 5.4" (1.661 m)   Wt 161 lb 3.2 oz (73.1 kg)   SpO2 97%   BMI 26.50 kg/m?   ?Wt Readings from Last 3 Encounters:  ?04/21/21 161 lb 3.2 oz (73.1 kg)  ?12/26/20 158 lb (71.7 kg)  ?07/22/20 166 lb (75.3 kg)  ?  ?Physical Exam ?Vitals and nursing note reviewed.  ?Constitutional:   ?   General: He is not in acute distress. ?   Appearance: Normal appearance. He is not ill-appearing, toxic-appearing or diaphoretic.  ?HENT:  ?   Head: Normocephalic.  ?   Right Ear: External ear normal.  ?   Left Ear: External ear normal.  ?   Nose: Nose normal. No congestion  or rhinorrhea.  ?   Mouth/Throat:  ?   Mouth: Mucous membranes are moist.  ?Eyes:  ?   General:     ?   Right eye: No discharge.     ?   Left eye: No discharge.  ?   Extraocular Movements: Extraocular movements intact.  ?   Conjunctiva/sclera: Conjunctivae normal.  ?   Pupils: Pupils are equal, round, and reactive to light.  ?Cardiovascular:  ?   Rate and Rhythm: Normal rate and regular rhythm.  ?   Heart sounds: No murmur heard. ?Pulmonary:  ?   Effort: Pulmonary effort is normal. No respiratory distress.  ?   Breath sounds: Normal breath sounds. No wheezing, rhonchi or rales.  ?Abdominal:  ?   General: Abdomen is flat. Bowel sounds are normal.  ?Musculoskeletal:  ?   Cervical back: Normal range of motion and neck supple.  ?Skin: ?   General: Skin is warm and dry.  ?   Capillary Refill: Capillary refill takes less than 2 seconds.  ?Neurological:  ?   General: No focal deficit present.  ?   Mental Status:  He is alert and oriented to person, place, and time.  ?Psychiatric:     ?   Mood and Affect: Mood normal.     ?   Behavior: Behavior normal.     ?   Thought Content: Thought content normal.     ?   Judgment: Judgment normal.  ? ? ?Results for orders placed or performed in visit on 05/16/20  ?PSA Total (Reflex To Free) ([facility] only)  ?Result Value Ref Range  ? Prostate Specific Ag, Serum 0.9 0.0 - 4.0 ng/mL  ? Reflex Criteria Comment   ?CBC  ?Result Value Ref Range  ? WBC 5.9 3.4 - 10.8 x10E3/uL  ? RBC 4.34 4.14 - 5.80 x10E6/uL  ? Hemoglobin 14.2 13.0 - 17.7 g/dL  ? Hematocrit 41.4 37.5 - 51.0 %  ? MCV 95 79 - 97 fL  ? MCH 32.7 26.6 - 33.0 pg  ? MCHC 34.3 31.5 - 35.7 g/dL  ? RDW 13.1 11.6 - 15.4 %  ? Platelets 205 150 - 450 x10E3/uL  ?Lipid panel  ?Result Value Ref Range  ? Cholesterol, Total 97 (L) 100 - 199 mg/dL  ? Triglycerides 65 0 - 149 mg/dL  ? HDL 45 >39 mg/dL  ? VLDL Cholesterol Cal 14 5 - 40 mg/dL  ? LDL Chol Calc (NIH) 38 0 - 99 mg/dL  ? Chol/HDL Ratio 2.2 0.0 - 5.0 ratio  ?Comprehensive metabolic panel  ?Result Value Ref Range  ? Glucose 96 65 - 99 mg/dL  ? BUN 16 8 - 27 mg/dL  ? Creatinine, Ser 1.39 (H) 0.76 - 1.27 mg/dL  ? eGFR 53 (L) >59 mL/min/1.73  ? BUN/Creatinine Ratio 12 10 - 24  ? Sodium 141 134 - 144 mmol/L  ? Potassium 4.4 3.5 - 5.2 mmol/L  ? Chloride 102 96 - 106 mmol/L  ? CO2 23 20 - 29 mmol/L  ? Calcium 9.4 8.6 - 10.2 mg/dL  ? Total Protein 7.1 6.0 - 8.5 g/dL  ? Albumin 4.6 3.7 - 4.7 g/dL  ? Globulin, Total 2.5 1.5 - 4.5 g/dL  ? Albumin/Globulin Ratio 1.8 1.2 - 2.2  ? Bilirubin Total 1.0 0.0 - 1.2 mg/dL  ? Alkaline Phosphatase 82 44 - 121 IU/L  ? AST 24 0 - 40 IU/L  ? ALT 16 0 - 44 IU/L  ? ?   ?Assessment &  Plan:  ? ?Problem List Items Addressed This Visit   ? ?  ? Cardiovascular and Mediastinum  ? Essential (primary) hypertension  ?  Chronic.  Elevated at visit today.  Does not check at home.  Will reevaluate at next visit and increase dose of Amlodipine to 82m if needed at that time.   Continue with current medication regimen.  Labs ordered today.  Return to clinic in 3 months for reevaluation.  Call sooner if concerns arise.  ? ?  ?  ? Relevant Medications  ? atorvastatin (LIPITOR) 20 MG tablet  ? Aortic atherosclerosis ([place]  ?  Chronic.  Controlled.  Continue with current medication regimen on Atorvastatin 276mdaily.  Refills snet today.  Labs ordered today.  Return to clinic in 3 months for reevaluation.  Call sooner if concerns arise.  ? ?  ?  ? Relevant Medications  ? atorvastatin (LIPITOR) 20 MG tablet  ? Other Relevant Orders  ? Comp Met (CMET)  ? Lipid Profile  ? CBC w/Diff  ?  ? Respiratory  ? Emphysema lung ([place]- Primary  ?  Quit smoking in 2015.  Controlled at this time.  Denies concerns.  Follow up in 3 months for reevaluation. ?  ?  ? Relevant Orders  ? Comp Met (CMET)  ? CBC w/Diff  ?  ? Other  ? Hypertriglyceridemia  ? Relevant Medications  ? atorvastatin (LIPITOR) 20 MG tablet  ? Other Relevant Orders  ? Comp Met (CMET)  ? Lipid Profile  ? ?Other Visit Diagnoses   ? ? Encounter to establish care      ? Screening for prostate cancer      ? Relevant Orders  ? PSA  ? ?  ?  ? ?Follow up plan: ?Return in about 3 months (around 07/22/2021) for HTN, HLD, DM2 FU. ? ? ? ? ? ?

## 2021-04-21 NOTE — Assessment & Plan Note (Signed)
Chronic.  Elevated at visit today.  Does not check at home.  Will reevaluate at next visit and increase dose of Amlodipine to 5mg  if needed at that time.  Continue with current medication regimen.  Labs ordered today.  Return to clinic in 3 months for reevaluation.  Call sooner if concerns arise.  ? ?

## 2021-04-22 LAB — CBC WITH DIFFERENTIAL/PLATELET
Basophils Absolute: 0.1 10*3/uL (ref 0.0–0.2)
Basos: 1 %
EOS (ABSOLUTE): 0.2 10*3/uL (ref 0.0–0.4)
Eos: 3 %
Hematocrit: 39.9 % (ref 37.5–51.0)
Hemoglobin: 13.6 g/dL (ref 13.0–17.7)
Immature Grans (Abs): 0 10*3/uL (ref 0.0–0.1)
Immature Granulocytes: 0 %
Lymphocytes Absolute: 1.5 10*3/uL (ref 0.7–3.1)
Lymphs: 28 %
MCH: 32.5 pg (ref 26.6–33.0)
MCHC: 34.1 g/dL (ref 31.5–35.7)
MCV: 95 fL (ref 79–97)
Monocytes Absolute: 0.4 10*3/uL (ref 0.1–0.9)
Monocytes: 7 %
Neutrophils Absolute: 3.1 10*3/uL (ref 1.4–7.0)
Neutrophils: 61 %
Platelets: 195 10*3/uL (ref 150–450)
RBC: 4.19 x10E6/uL (ref 4.14–5.80)
RDW: 13.8 % (ref 11.6–15.4)
WBC: 5.2 10*3/uL (ref 3.4–10.8)

## 2021-04-22 LAB — COMPREHENSIVE METABOLIC PANEL
ALT: 12 IU/L (ref 0–44)
AST: 20 IU/L (ref 0–40)
Albumin/Globulin Ratio: 2 (ref 1.2–2.2)
Albumin: 4.5 g/dL (ref 3.7–4.7)
Alkaline Phosphatase: 70 IU/L (ref 44–121)
BUN/Creatinine Ratio: 13 (ref 10–24)
BUN: 18 mg/dL (ref 8–27)
Bilirubin Total: 1.1 mg/dL (ref 0.0–1.2)
CO2: 24 mmol/L (ref 20–29)
Calcium: 9.2 mg/dL (ref 8.6–10.2)
Chloride: 103 mmol/L (ref 96–106)
Creatinine, Ser: 1.36 mg/dL — ABNORMAL HIGH (ref 0.76–1.27)
Globulin, Total: 2.2 g/dL (ref 1.5–4.5)
Glucose: 87 mg/dL (ref 70–99)
Potassium: 4.6 mmol/L (ref 3.5–5.2)
Sodium: 143 mmol/L (ref 134–144)
Total Protein: 6.7 g/dL (ref 6.0–8.5)
eGFR: 54 mL/min/{1.73_m2} — ABNORMAL LOW (ref >59)

## 2021-04-22 LAB — LIPID PANEL
Chol/HDL Ratio: 2.3 ratio (ref 0.0–5.0)
Cholesterol, Total: 105 mg/dL (ref 100–199)
HDL: 46 mg/dL (ref >39)
LDL Chol Calc (NIH): 46 mg/dL (ref 0–99)
Triglycerides: 54 mg/dL (ref 0–149)
VLDL Cholesterol Cal: 13 mg/dL (ref 5–40)

## 2021-04-22 LAB — PSA: Prostate Specific Ag, Serum: 0.8 ng/mL (ref 0.0–4.0)

## 2021-04-24 NOTE — Progress Notes (Signed)
Please let patient know that overall his lab work looks good.  His kidney function does show that he has some chronic kidney disease.  I recommend he avoid NSAIDS such as ibuprofen, motrin and aleve.  Make sure to drink plenty of water.  Otherwise, lab work looks good.  We will continue to monitor at future visits.

## 2021-05-29 ENCOUNTER — Encounter: Payer: Medicare Other | Admitting: Family Medicine

## 2021-05-30 ENCOUNTER — Telehealth: Payer: Self-pay | Admitting: *Deleted

## 2021-05-30 ENCOUNTER — Other Ambulatory Visit: Payer: Self-pay | Admitting: *Deleted

## 2021-05-30 DIAGNOSIS — Z122 Encounter for screening for malignant neoplasm of respiratory organs: Secondary | ICD-10-CM

## 2021-05-30 DIAGNOSIS — Z87891 Personal history of nicotine dependence: Secondary | ICD-10-CM

## 2021-05-30 NOTE — Telephone Encounter (Signed)
LMTC to schedule Yearly Lung CA CT Scan. 

## 2021-06-09 ENCOUNTER — Ambulatory Visit
Admission: RE | Admit: 2021-06-09 | Discharge: 2021-06-09 | Disposition: A | Discharge status: Home / Self Care | Payer: Medicare Other | Source: Ambulatory Visit | Attending: Acute Care | Admitting: Acute Care

## 2021-06-09 ENCOUNTER — Other Ambulatory Visit: Payer: Self-pay

## 2021-06-09 DIAGNOSIS — Z87891 Personal history of nicotine dependence: Secondary | ICD-10-CM | POA: Insufficient documentation

## 2021-06-09 DIAGNOSIS — Z122 Encounter for screening for malignant neoplasm of respiratory organs: Secondary | ICD-10-CM | POA: Diagnosis not present

## 2021-06-12 ENCOUNTER — Other Ambulatory Visit: Payer: Self-pay

## 2021-06-12 DIAGNOSIS — Z122 Encounter for screening for malignant neoplasm of respiratory organs: Secondary | ICD-10-CM

## 2021-06-12 DIAGNOSIS — Z87891 Personal history of nicotine dependence: Secondary | ICD-10-CM

## 2021-07-24 ENCOUNTER — Encounter: Payer: Self-pay | Admitting: Nurse Practitioner

## 2021-07-24 ENCOUNTER — Ambulatory Visit (INDEPENDENT_AMBULATORY_CARE_PROVIDER_SITE_OTHER): Payer: Medicare Other | Admitting: Nurse Practitioner

## 2021-07-24 VITALS — BP 154/66 | HR 55 | Temp 98.6°F | Wt 161.6 lb

## 2021-07-24 DIAGNOSIS — J439 Emphysema, unspecified: Secondary | ICD-10-CM | POA: Diagnosis not present

## 2021-07-24 DIAGNOSIS — I10 Essential (primary) hypertension: Secondary | ICD-10-CM

## 2021-07-24 DIAGNOSIS — E781 Pure hyperglyceridemia: Secondary | ICD-10-CM | POA: Diagnosis not present

## 2021-07-24 DIAGNOSIS — I700 Atherosclerosis of aorta: Secondary | ICD-10-CM

## 2021-07-24 MED ORDER — AMLODIPINE BESYLATE 5 MG PO TABS: 5.0000 mg | ORAL_TABLET | Freq: Every day | ORAL | 1 refills | Status: DC

## 2021-07-24 NOTE — Progress Notes (Signed)
BP (!) 154/66   Pulse (!) 55   Temp 98.6 F (37 C) (Oral)   Wt 161 lb 9.6 oz (73.3 kg)   SpO2 97%   BMI 26.48 kg/m    Subjective:    Patient ID: Drew Russell, male    DOB: 1945/08/23, 76 y.o.   MRN: [id number]  HPI: Drew Russell is a 76 y.o. male  Chief Complaint  Patient presents with   Hypertension    3 month follow up. No questions or concerns at this moment per pt    Hyperlipidemia   Diabetes   HYPERTENSION / [place] Satisfied with current treatment? yes Duration of hypertension: years BP monitoring frequency: not checking BP range:  BP medication side effects: no Past BP meds: amlodipine Duration of hyperlipidemia: years Cholesterol medication side effects: no Cholesterol supplements: none Past cholesterol medications: atorvastain (lipitor) Medication compliance: excellent compliance Aspirin: no Recent stressors: no Recurrent headaches: no Visual changes: no Palpitations: no Dyspnea: no Chest pain: no Lower extremity edema: no Dizzy/lightheaded: no  COPD COPD status: controlled Satisfied with current treatment?: yes Oxygen use: no Dyspnea frequency: no Cough frequency: no Rescue inhaler frequency:  none Limitation of activity: no Productive cough:  Last Spirometry:  Pneumovax: Up to Date Influenza: Not up to Date  Relevant past medical, surgical, family and social history reviewed and updated as indicated. Interim medical history since our last visit reviewed. Allergies and medications reviewed and updated.  Review of Systems  Eyes:  Negative for visual disturbance.  Respiratory:  Negative for chest tightness and shortness of breath.   Cardiovascular:  Negative for chest pain, palpitations and leg swelling.  Neurological:  Negative for dizziness, light-headedness and headaches.   Per HPI unless specifically indicated above     Objective:    BP (!) 154/66   Pulse (!) 55   Temp 98.6 F (37 C) (Oral)   Wt 161 lb 9.6 oz (73.3  kg)   SpO2 97%   BMI 26.48 kg/m   Wt Readings from Last 3 Encounters:  07/24/21 161 lb 9.6 oz (73.3 kg)  06/09/21 169 lb (76.7 kg)  04/21/21 161 lb 3.2 oz (73.1 kg)    Physical Exam Vitals and nursing note reviewed.  Constitutional:      General: He is not in acute distress.    Appearance: Normal appearance. He is not ill-appearing, toxic-appearing or diaphoretic.  HENT:     Head: Normocephalic.     Right Ear: External ear normal.     Left Ear: External ear normal.     Nose: Nose normal. No congestion or rhinorrhea.     Mouth/Throat:     Mouth: Mucous membranes are moist.  Eyes:     General:        Right eye: No discharge.        Left eye: No discharge.     Extraocular Movements: Extraocular movements intact.     Conjunctiva/sclera: Conjunctivae normal.     Pupils: Pupils are equal, round, and reactive to light.  Cardiovascular:     Rate and Rhythm: Normal rate and regular rhythm.     Heart sounds: No murmur heard. Pulmonary:     Effort: Pulmonary effort is normal. No respiratory distress.     Breath sounds: Normal breath sounds. No wheezing, rhonchi or rales.  Abdominal:     General: Abdomen is flat. Bowel sounds are normal.  Musculoskeletal:     Cervical back: Normal range of motion and neck supple.  Skin:  General: Skin is warm and dry.     Capillary Refill: Capillary refill takes less than 2 seconds.  Neurological:     General: No focal deficit present.     Mental Status: He is alert and oriented to person, place, and time.  Psychiatric:        Mood and Affect: Mood normal.        Behavior: Behavior normal.        Thought Content: Thought content normal.        Judgment: Judgment normal.    Results for orders placed or performed in visit on 04/21/21  Comp Met (CMET)  Result Value Ref Range   Glucose 87 70 - 99 mg/dL   BUN 18 8 - 27 mg/dL   Creatinine, Ser 1.36 (H) 0.76 - 1.27 mg/dL   eGFR 54 (L) >59 mL/min/1.73   BUN/Creatinine Ratio 13 10 - 24    Sodium 143 134 - 144 mmol/L   Potassium 4.6 3.5 - 5.2 mmol/L   Chloride 103 96 - 106 mmol/L   CO2 24 20 - 29 mmol/L   Calcium 9.2 8.6 - 10.2 mg/dL   Total Protein 6.7 6.0 - 8.5 g/dL   Albumin 4.5 3.7 - 4.7 g/dL   Globulin, Total 2.2 1.5 - 4.5 g/dL   Albumin/Globulin Ratio 2.0 1.2 - 2.2   Bilirubin Total 1.1 0.0 - 1.2 mg/dL   Alkaline Phosphatase 70 44 - 121 IU/L   AST 20 0 - 40 IU/L   ALT 12 0 - 44 IU/L  Lipid Profile  Result Value Ref Range   Cholesterol, Total 105 100 - 199 mg/dL   Triglycerides 54 0 - 149 mg/dL   HDL 46 >39 mg/dL   VLDL Cholesterol Cal 13 5 - 40 mg/dL   LDL Chol Calc (NIH) 46 0 - 99 mg/dL   Chol/HDL Ratio 2.3 0.0 - 5.0 ratio  CBC w/Diff  Result Value Ref Range   WBC 5.2 3.4 - 10.8 x10E3/uL   RBC 4.19 4.14 - 5.80 x10E6/uL   Hemoglobin 13.6 13.0 - 17.7 g/dL   Hematocrit 39.9 37.5 - 51.0 %   MCV 95 79 - 97 fL   MCH 32.5 26.6 - 33.0 pg   MCHC 34.1 31.5 - 35.7 g/dL   RDW 13.8 11.6 - 15.4 %   Platelets 195 150 - 450 x10E3/uL   Neutrophils 61 Not Estab. %   Lymphs 28 Not Estab. %   Monocytes 7 Not Estab. %   Eos 3 Not Estab. %   Basos 1 Not Estab. %   Neutrophils Absolute 3.1 1.4 - 7.0 x10E3/uL   Lymphocytes Absolute 1.5 0.7 - 3.1 x10E3/uL   Monocytes Absolute 0.4 0.1 - 0.9 x10E3/uL   EOS (ABSOLUTE) 0.2 0.0 - 0.4 x10E3/uL   Basophils Absolute 0.1 0.0 - 0.2 x10E3/uL   Immature Granulocytes 0 Not Estab. %   Immature Grans (Abs) 0.0 0.0 - 0.1 x10E3/uL  PSA  Result Value Ref Range   Prostate Specific Ag, Serum 0.8 0.0 - 4.0 ng/mL      Assessment & Plan:   Problem List Items Addressed This Visit       Cardiovascular and Mediastinum   Essential (primary) hypertension    Chronic.  Elevated at visit today.  Will increase dose of Amlodipine to 45m.   Labs ordered today.  Return to clinic in 1 month for reevaluation.  Call sooner if concerns arise.         Relevant Medications  amLODipine (NORVASC) 5 MG tablet   Other Relevant Orders   Comp Met  (CMET)   Aortic atherosclerosis ([place]) - Primary    Chronic. Found on CT Lung in 2023.  Continue with Atorvastatin.  Follow up in 3 months for reevaluation.       Relevant Medications   amLODipine (NORVASC) 5 MG tablet     Respiratory   Emphysema lung ([facility])    Chronic.  Found on CT lung.  Controlled.  Continue with current medication regimen.  Not currently symptomatic.  Labs ordered today.  Return to clinic in 3 months for reevaluation.  Call sooner if concerns arise.           Other   Hypertriglyceridemia    Chronic.  Controlled.  Continue with current medication regimen of Atorvastatin 78m.  Labs ordered today.  Return to clinic in 6 months for reevaluation.  Call sooner if concerns arise.         Relevant Medications   amLODipine (NORVASC) 5 MG tablet   Other Relevant Orders   Lipid Profile     Follow up plan: Return in about 1 month (around 08/23/2021) for BP Check.

## 2021-07-24 NOTE — Assessment & Plan Note (Signed)
Chronic.  Controlled.  Continue with current medication regimen of Atorvastatin 20mg.  Labs ordered today.  Return to clinic in 6 months for reevaluation.  Call sooner if concerns arise.   

## 2021-07-24 NOTE — Assessment & Plan Note (Signed)
Chronic.  Found on CT lung.  Controlled.  Continue with current medication regimen.  Not currently symptomatic.  Labs ordered today.  Return to clinic in 3 months for reevaluation.  Call sooner if concerns arise.

## 2021-07-24 NOTE — Assessment & Plan Note (Signed)
Chronic.  Elevated at visit today.  Will increase dose of Amlodipine to 5mg .   Labs ordered today.  Return to clinic in 1 month for reevaluation.  Call sooner if concerns arise.

## 2021-07-24 NOTE — Assessment & Plan Note (Signed)
Chronic. Found on CT Lung in 2023.  Continue with Atorvastatin.  Follow up in 3 months for reevaluation. 

## 2021-07-25 DIAGNOSIS — N1831 Chronic kidney disease, stage 3a: Secondary | ICD-10-CM | POA: Insufficient documentation

## 2021-07-25 DIAGNOSIS — N183 Chronic kidney disease, stage 3 unspecified: Secondary | ICD-10-CM | POA: Insufficient documentation

## 2021-07-25 LAB — COMPREHENSIVE METABOLIC PANEL
ALT: 12 IU/L (ref 0–44)
AST: 18 IU/L (ref 0–40)
Albumin/Globulin Ratio: 1.9 (ref 1.2–2.2)
Albumin: 4.4 g/dL (ref 3.7–4.7)
Alkaline Phosphatase: 77 IU/L (ref 44–121)
BUN/Creatinine Ratio: 14 (ref 10–24)
BUN: 20 mg/dL (ref 8–27)
Bilirubin Total: 0.7 mg/dL (ref 0.0–1.2)
CO2: 26 mmol/L (ref 20–29)
Calcium: 9.2 mg/dL (ref 8.6–10.2)
Chloride: 104 mmol/L (ref 96–106)
Creatinine, Ser: 1.42 mg/dL — ABNORMAL HIGH (ref 0.76–1.27)
Globulin, Total: 2.3 g/dL (ref 1.5–4.5)
Glucose: 101 mg/dL — ABNORMAL HIGH (ref 70–99)
Potassium: 4.4 mmol/L (ref 3.5–5.2)
Sodium: 142 mmol/L (ref 134–144)
Total Protein: 6.7 g/dL (ref 6.0–8.5)
eGFR: 51 mL/min/{1.73_m2} — ABNORMAL LOW (ref >59)

## 2021-07-25 LAB — LIPID PANEL
Chol/HDL Ratio: 2.1 ratio (ref 0.0–5.0)
Cholesterol, Total: 87 mg/dL — ABNORMAL LOW (ref 100–199)
HDL: 42 mg/dL (ref >39)
LDL Chol Calc (NIH): 30 mg/dL (ref 0–99)
Triglycerides: 66 mg/dL (ref 0–149)
VLDL Cholesterol Cal: 15 mg/dL (ref 5–40)

## 2021-07-25 NOTE — Progress Notes (Signed)
Please let patient know that his lab work looks good.  His kidneys do show some decline in function over the last couple of years.  No medications to help at this time.  Recommend avoiding NSAIDS such as ibuprofen, motrin and aleve.  Continue with other medications as discussed.  I will see him in 1 month.

## 2021-08-08 ENCOUNTER — Ambulatory Visit (INDEPENDENT_AMBULATORY_CARE_PROVIDER_SITE_OTHER): Payer: Medicare Other | Admitting: *Deleted

## 2021-08-08 DIAGNOSIS — Z0000 Encounter for general adult medical examination without abnormal findings: Secondary | ICD-10-CM | POA: Diagnosis not present

## 2021-08-08 NOTE — Progress Notes (Signed)
Subjective:   Drew Russell is a 76 y.o. male who presents for Medicare Annual/Subsequent preventive examination.  I connected with  Drew Russell on 08/08/21 by a telephone enabled telemedicine application and verified that I am speaking with the correct person using two identifiers.   I discussed the limitations of evaluation and management by telemedicine. The patient expressed understanding and agreed to proceed.  Patient location: home  Provider location:  tele-health-home    Review of Systems     Cardiac Risk Factors include: advanced age (>22men, >61 women);hypertension;male gender;family history of premature cardiovascular disease     Objective:    Today's Vitals   There is no height or weight on file to calculate BMI.     08/08/2021    9:45 AM 04/23/2018    3:00 PM 04/22/2017    3:00 PM 04/18/2016    2:50 PM  Advanced Directives  Does Patient Have a Medical Advance Directive? No No No No  Would patient like information on creating a medical advance directive? No - Patient declined No - Patient declined No - Patient declined     Current Medications (verified) Outpatient Encounter Medications as of 08/08/2021  Medication Sig   amLODipine (NORVASC) 5 MG tablet Take 1 tablet (5 mg total) by mouth daily.   atorvastatin (LIPITOR) 20 MG tablet Take 1 tablet (20 mg total) by mouth daily.   Omega-3 Fatty Acids (FISH OIL) 1000 MG CAPS Take 1,200 mg by mouth daily.    No facility-administered encounter medications on file as of 08/08/2021.    Allergies (verified) Patient has no known allergies.   History: Past Medical History:  Diagnosis Date   History of chicken pox    History of measles as a child    History of mumps as a child    Hyperlipidemia    Hypertension    Past Surgical History:  Procedure Laterality Date   ANGIOPLASTY  05/21/1993   [facility]   Cardiac Catherterization     per pt had angioplast in 1995, no stent   CATARACT  EXTRACTION Bilateral 2007   Family History  Problem Relation Age of Onset   Stroke Mother    Heart disease Father    Heart attack Father    Colon cancer Sister    Diabetes Sister        type 2   Diabetes Sister        type 2   Social History   Socioeconomic History   Marital status: Single    Spouse name: Not on file   Number of children: 0   Years of education: 8th grade   Highest education level: 8th grade  Occupational History   Occupation: Retired  Tobacco Use   Smoking status: Former    Packs/day: 1.00    Years: 50.00    Total pack years: 50.00    Types: Cigarettes    Quit date: 01/01/2014    Years since quitting: 7.6   Smokeless tobacco: Never   Tobacco comments:    smoked 1ppd 50 years. Quit 2015  Vaping Use   Vaping Use: Never used  Substance and Sexual Activity   Alcohol use: No    Alcohol/week: 0.0 standard drinks of alcohol    Comment: Previous heavy drinker   Drug use: No   Sexual activity: Not Currently  Other Topics Concern   Not on file  Social History Narrative   Not on file   Social Determinants of Health  Financial Resource Strain: Low Risk  (08/08/2021)   Overall Financial Resource Strain (CARDIA)    Difficulty of Paying Living Expenses: Not hard at all  Food Insecurity: No Food Insecurity (08/08/2021)   Hunger Vital Sign    Worried About Running Out of Food in the Last Year: Never true    Ran Out of Food in the Last Year: Never true  Transportation Needs: No Transportation Needs (08/08/2021)   PRAPARE - Administrator, Civil Service (Medical): No    Lack of Transportation (Non-Medical): No  Physical Activity: Insufficiently Active (08/08/2021)   Exercise Vital Sign    Days of Exercise per Week: 4 days    Minutes of Exercise per Session: 10 min  Stress: No Stress Concern Present (08/08/2021)   [employer] of Occupational Health - Occupational Stress Questionnaire    Feeling of Stress : Not at all  Social Connections:  Socially Isolated (08/08/2021)   Social Connection and Isolation Panel [NHANES]    Frequency of Communication with Friends and Family: Once a week    Frequency of Social Gatherings with Friends and Family: Never    Attends Religious Services: Never    Database administrator or Organizations: No    Attends Banker Meetings: Never    Marital Status: Never married    Tobacco Counseling Counseling given: Not Answered Tobacco comments: smoked 1ppd 50 years. Quit 2015   Clinical Intake:  Pre-visit preparation completed: Yes  Pain : No/denies pain     Nutritional Risks: None Diabetes: No  How often do you need to have someone help you when you read instructions, pamphlets, or other written materials from your doctor or pharmacy?: 1 - Never  Diabetic?  no  Interpreter Needed?: No  Information entered by :: Remi Haggard LPN   Activities of Daily Living    08/08/2021    9:45 AM  In your present state of health, do you have any difficulty performing the following activities:  Hearing? 0  Vision? 0  Difficulty concentrating or making decisions? 0  Walking or climbing stairs? 0  Dressing or bathing? 0  Doing errands, shopping? 0  Preparing Food and eating ? N  Using the Toilet? N  In the past six months, have you accidently leaked urine? N  Do you have problems with loss of bowel control? N  Managing your Medications? N  Managing your Finances? N  Housekeeping or managing your Housekeeping? N    Patient Care Team: Larae Grooms, NP as PCP - General (Nurse Practitioner)  Indicate any recent Medical Services you may have received from other than [facility] providers in the past year (date may be approximate).     Assessment:   This is a routine wellness examination for [facility].  Hearing/Vision screen Hearing Screening - Comments:: No trouble hearing Vision Screening - Comments:: Not up to date  Dietary issues and exercise activities discussed: Current  Exercise Habits: Home exercise routine, Type of exercise: walking, Time (Minutes): 15, Frequency (Times/Week): 4, Weekly Exercise (Minutes/Week): 60, Intensity: Mild   Goals Addressed             This Visit's Progress    Patient Stated       No goals       Depression Screen    08/08/2021    9:41 AM 07/24/2021    1:11 PM 07/22/2020    1:29 PM 05/16/2020    1:40 PM 04/23/2018    3:00 PM 04/22/2017  3:04 PM 04/22/2017    3:00 PM  PHQ 2/9 Scores  PHQ - 2 Score 0 0 1 0 0 0 0  PHQ- 9 Score 0 0 1 0  0     Fall Risk    08/08/2021    9:34 AM 07/24/2021    1:11 PM 05/16/2020    1:40 PM 04/23/2018    3:00 PM 04/22/2017    3:00 PM  Fall Risk   Falls in the past year? 1 0 0 0 No  Number falls in past yr: 0 0     Injury with Fall? 0 0 0    Risk for fall due to :  No Fall Risks No Fall Risks    Follow up Falls evaluation completed;Education provided;Falls prevention discussed Falls evaluation completed Falls evaluation completed      FALL RISK PREVENTION PERTAINING TO THE HOME:  Any stairs in or around the home? No  If so, are there any without handrails? No  Home free of loose throw rugs in walkways, pet beds, electrical cords, etc? Yes  Adequate lighting in your home to reduce risk of falls? Yes   ASSISTIVE DEVICES UTILIZED TO PREVENT FALLS:  Life alert? No  Use of a cane, walker or w/c? No  Grab bars in the bathroom? No  Shower chair or bench in shower? No  Elevated toilet seat or a handicapped toilet? No   TIMED UP AND GO:  Was the test performed? No .    Cognitive Function:        08/08/2021    9:35 AM 04/23/2018    3:05 PM 04/22/2017    3:05 PM 04/18/2016    2:56 PM  6CIT Screen  What Year? 0 points 0 points 0 points 0 points  What month? 0 points 0 points 0 points 0 points  What time? 0 points 3 points 0 points 0 points  Count back from 20 0 points 0 points 0 points 0 points  Months in reverse 2 points 0 points 0 points 0 points  Repeat phrase 6 points 0 points 4  points 6 points  Total Score 8 points 3 points 4 points 6 points    Immunizations Immunization History  Administered Date(s) Administered   Influenza, High Dose Seasonal PF 04/18/2016   Pneumococcal Conjugate-13 06/12/2013   Pneumococcal Polysaccharide-23 04/09/2012    TDAP status: Due, Education has been provided regarding the importance of this vaccine. Advised may receive this vaccine at local pharmacy or Health Dept. Aware to provide a copy of the vaccination record if obtained from local pharmacy or Health Dept. Verbalized acceptance and understanding.  Flu Vaccine status: Due, Education has been provided regarding the importance of this vaccine. Advised may receive this vaccine at local pharmacy or Health Dept. Aware to provide a copy of the vaccination record if obtained from local pharmacy or Health Dept. Verbalized acceptance and understanding.  Pneumococcal vaccine status: Up to date  Covid-19 vaccine status: Information provided on how to obtain vaccines.   Qualifies for Shingles Vaccine? Yes   Zostavax completed No   Shingrix Completed?: No.    Education has been provided regarding the importance of this vaccine. Patient has been advised to call insurance company to determine out of pocket expense if they have not yet received this vaccine. Advised may also receive vaccine at local pharmacy or Health Dept. Verbalized acceptance and understanding.  Screening Tests Health Maintenance  Topic Date Due   TETANUS/TDAP  Never done   COVID-19  Vaccine (1) 08/24/2021 (Originally 04/27/1946)   Zoster Vaccines- Shingrix (1 of 2) 11/08/2021 (Originally 10/28/1993)   INFLUENZA VACCINE  09/19/2021   Pneumonia Vaccine 83+ Years old  Completed   Hepatitis C Screening  Completed   HPV VACCINES  Aged Out    Health Maintenance  Health Maintenance Due  Topic Date Due   TETANUS/TDAP  Never done    Colorectal cancer screening: No longer required.   Lung Cancer Screening: (Low Dose CT  Chest recommended if Age 30-80 years, 30 pack-year currently smoking OR have quit w/in 15years.) does qualify.   Lung Cancer Screening Referral:   had 2023 due next year  Additional Screening:  Hepatitis C Screening: does not qualify; Completed 2018  Vision Screening: Recommended annual ophthalmology exams for early detection of glaucoma and other disorders of the eye. Is the patient up to date with their annual eye exam?  No  Who is the provider or what is the name of the office in which the patient attends annual eye exams?  If pt is not established with a provider, would they like to be referred to a provider to establish care? No .   Dental Screening: Recommended annual dental exams for proper oral hygiene  Community Resource Referral / Chronic Care Management: CRR required this visit?  No   CCM required this visit?  No      Plan:     I have personally reviewed and noted the following in the patient's chart:   Medical and social history Use of alcohol, tobacco or illicit drugs  Current medications and supplements including opioid prescriptions. Patient is not currently taking opioid prescriptions. Functional ability and status Nutritional status Physical activity Advanced directives List of other physicians Hospitalizations, surgeries, and ER visits in previous 12 months Vitals Screenings to include cognitive, depression, and falls Referrals and appointments  In addition, I have reviewed and discussed with patient certain preventive protocols, quality metrics, and best practice recommendations. A written personalized care plan for preventive services as well as general preventive health recommendations were provided to patient.     Remi Haggard, LPN   08/20/6376   Nurse Notes:

## 2021-08-08 NOTE — Patient Instructions (Signed)
[name] , Thank you for taking time to come for your Medicare Wellness Visit. I appreciate your ongoing commitment to your health goals. Please review the following plan we discussed and let me know if I can assist you in the future.   Screening recommendations/referrals: Colonoscopy: no longer required Recommended yearly ophthalmology/optometry visit for glaucoma screening and checkup Recommended yearly dental visit for hygiene and checkup  Vaccinations: Influenza vaccine: Education provided Pneumococcal vaccine: up to date Tdap vaccine: Education provided Shingles vaccine: Education provided    Advanced directives: Education provided  Conditions/risks identified:   Next appointment: 08-23-2021 @ 1:40 [facility]  Preventive Care 78 Years and Older, Male Preventive care refers to lifestyle choices and visits with your health care provider that can promote health and wellness. What does preventive care include? A yearly physical exam. This is also called an annual well check. Dental exams once or twice a year. Routine eye exams. Ask your health care provider how often you should have your eyes checked. Personal lifestyle choices, including: Daily care of your teeth and gums. Regular physical activity. Eating a healthy diet. Avoiding tobacco and drug use. Limiting alcohol use. Practicing safe sex. Taking low doses of aspirin every day. Taking vitamin and mineral supplements as recommended by your health care provider. What happens during an annual well check? The services and screenings done by your health care provider during your annual well check will depend on your age, overall health, lifestyle risk factors, and family history of disease. Counseling  Your health care provider may ask you questions about your: Alcohol use. Tobacco use. Drug use. Emotional well-being. Home and relationship well-being. Sexual activity. Eating habits. History of falls. Memory and  ability to understand (cognition). Work and work Astronomer. Screening  You may have the following tests or measurements: Height, weight, and BMI. Blood pressure. Lipid and cholesterol levels. These may be checked every 5 years, or more frequently if you are over 64 years old. Skin check. Lung cancer screening. You may have this screening every year starting at age 78 if you have a 30-pack-year history of smoking and currently smoke or have quit within the past 15 years. Fecal occult blood test (FOBT) of the stool. You may have this test every year starting at age 78. Flexible sigmoidoscopy or colonoscopy. You may have a sigmoidoscopy every 5 years or a colonoscopy every 10 years starting at age 78. Prostate cancer screening. Recommendations will vary depending on your family history and other risks. Hepatitis C blood test. Hepatitis B blood test. Sexually transmitted disease (STD) testing. Diabetes screening. This is done by checking your blood sugar (glucose) after you have not eaten for a while (fasting). You may have this done every 1-3 years. Abdominal aortic aneurysm (AAA) screening. You may need this if you are a current or former smoker. Osteoporosis. You may be screened starting at age 78 if you are at high risk. Talk with your health care provider about your test results, treatment options, and if necessary, the need for more tests. Vaccines  Your health care provider may recommend certain vaccines, such as: Influenza vaccine. This is recommended every year. Tetanus, diphtheria, and acellular pertussis (Tdap, Td) vaccine. You may need a Td booster every 10 years. Zoster vaccine. You may need this after age 46. Pneumococcal 13-valent conjugate (PCV13) vaccine. One dose is recommended after age 78. Pneumococcal polysaccharide (PPSV23) vaccine. One dose is recommended after age 60. Talk to your health care provider about which screenings and vaccines you need  and how often you need  them. This information is not intended to replace advice given to you by your health care provider. Make sure you discuss any questions you have with your health care provider. Document Released: 03/04/2015 Document Revised: 10/26/2015 Document Reviewed: 12/07/2014 Elsevier Interactive Patient Education  2017 [place] Prevention in the Home Falls can cause injuries. They can happen to people of all ages. There are many things you can do to make your home safe and to help prevent falls. What can I do on the outside of my home? Regularly fix the edges of walkways and driveways and fix any cracks. Remove anything that might make you trip as you walk through a door, such as a raised step or threshold. Trim any bushes or trees on the path to your home. Use bright outdoor lighting. Clear any walking paths of anything that might make someone trip, such as rocks or tools. Regularly check to see if handrails are loose or broken. Make sure that both sides of any steps have handrails. Any raised decks and porches should have guardrails on the edges. Have any leaves, snow, or ice cleared regularly. Use sand or salt on walking paths during winter. Clean up any spills in your garage right away. This includes oil or grease spills. What can I do in the bathroom? Use night lights. Install grab bars by the toilet and in the tub and shower. Do not use towel bars as grab bars. Use non-skid mats or decals in the tub or shower. If you need to sit down in the shower, use a plastic, non-slip stool. Keep the floor dry. Clean up any water that spills on the floor as soon as it happens. Remove soap buildup in the tub or shower regularly. Attach bath mats securely with double-sided non-slip rug tape. Do not have throw rugs and other things on the floor that can make you trip. What can I do in the bedroom? Use night lights. Make sure that you have a light by your bed that is easy to reach. Do not use  any sheets or blankets that are too big for your bed. They should not hang down onto the floor. Have a firm chair that has side arms. You can use this for support while you get dressed. Do not have throw rugs and other things on the floor that can make you trip. What can I do in the kitchen? Clean up any spills right away. Avoid walking on wet floors. Keep items that you use a lot in easy-to-reach places. If you need to reach something above you, use a strong step stool that has a grab bar. Keep electrical cords out of the way. Do not use floor polish or wax that makes floors slippery. If you must use wax, use non-skid floor wax. Do not have throw rugs and other things on the floor that can make you trip. What can I do with my stairs? Do not leave any items on the stairs. Make sure that there are handrails on both sides of the stairs and use them. Fix handrails that are broken or loose. Make sure that handrails are as long as the stairways. Check any carpeting to make sure that it is firmly attached to the stairs. Fix any carpet that is loose or worn. Avoid having throw rugs at the top or bottom of the stairs. If you do have throw rugs, attach them to the floor with carpet tape. Make sure that you  have a light switch at the top of the stairs and the bottom of the stairs. If you do not have them, ask someone to add them for you. What else can I do to help prevent falls? Wear shoes that: Do not have high heels. Have rubber bottoms. Are comfortable and fit you well. Are closed at the toe. Do not wear sandals. If you use a stepladder: Make sure that it is fully opened. Do not climb a closed stepladder. Make sure that both sides of the stepladder are locked into place. Ask someone to hold it for you, if possible. Clearly mark and make sure that you can see: Any grab bars or handrails. First and last steps. Where the edge of each step is. Use tools that help you move around (mobility aids)  if they are needed. These include: Canes. Walkers. Scooters. Crutches. Turn on the lights when you go into a dark area. Replace any light bulbs as soon as they burn out. Set up your furniture so you have a clear path. Avoid moving your furniture around. If any of your floors are uneven, fix them. If there are any pets around you, be aware of where they are. Review your medicines with your doctor. Some medicines can make you feel dizzy. This can increase your chance of falling. Ask your doctor what other things that you can do to help prevent falls. This information is not intended to replace advice given to you by your health care provider. Make sure you discuss any questions you have with your health care provider. Document Released: 12/02/2008 Document Revised: 07/14/2015 Document Reviewed: 03/12/2014 Elsevier Interactive Patient Education  2017 [employer].

## 2021-08-12 ENCOUNTER — Other Ambulatory Visit: Payer: Self-pay | Admitting: Nurse Practitioner

## 2021-08-12 DIAGNOSIS — E781 Pure hyperglyceridemia: Secondary | ICD-10-CM

## 2021-08-23 ENCOUNTER — Encounter: Payer: Self-pay | Admitting: Nurse Practitioner

## 2021-08-23 ENCOUNTER — Ambulatory Visit (INDEPENDENT_AMBULATORY_CARE_PROVIDER_SITE_OTHER): Payer: Medicare Other | Admitting: Nurse Practitioner

## 2021-08-23 VITALS — BP 122/68 | HR 65 | Temp 98.8°F | Wt 157.8 lb

## 2021-08-23 DIAGNOSIS — I10 Essential (primary) hypertension: Secondary | ICD-10-CM

## 2021-08-23 NOTE — Progress Notes (Signed)
BP 122/68   Pulse 65   Temp 98.8 F (37.1 C) (Oral)   Wt 157 lb 12.8 oz (71.6 kg)   SpO2 98%   BMI 25.86 kg/m    Subjective:    Patient ID: Drew Russell, male    DOB: [birth year], 76 y.o.   MRN: [id number]  HPI: Drew Russell is a 76 y.o. male  Chief Complaint  Patient presents with   Hypertension   HYPERTENSION Hypertension status: controlled  Satisfied with current treatment? no Duration of hypertension: years BP monitoring frequency:  not checking BP range:  BP medication side effects:  no Medication compliance: excellent compliance Previous BP meds:amlodipine Aspirin: no Recurrent headaches: no Visual changes: no Palpitations: no Dyspnea: no Chest pain: no Lower extremity edema: no Dizzy/lightheaded: no  Relevant past medical, surgical, family and social history reviewed and updated as indicated. Interim medical history since our last visit reviewed. Allergies and medications reviewed and updated.  Review of Systems  Eyes:  Negative for visual disturbance.  Respiratory:  Negative for shortness of breath.   Cardiovascular:  Negative for chest pain and leg swelling.  Neurological:  Negative for light-headedness and headaches.    Per HPI unless specifically indicated above     Objective:    BP 122/68   Pulse 65   Temp 98.8 F (37.1 C) (Oral)   Wt 157 lb 12.8 oz (71.6 kg)   SpO2 98%   BMI 25.86 kg/m   Wt Readings from Last 3 Encounters:  08/23/21 157 lb 12.8 oz (71.6 kg)  07/24/21 161 lb 9.6 oz (73.3 kg)  06/09/21 169 lb (76.7 kg)    Physical Exam Vitals and nursing note reviewed.  Constitutional:      General: He is not in acute distress.    Appearance: Normal appearance. He is not ill-appearing, toxic-appearing or diaphoretic.  HENT:     Head: Normocephalic.     Right Ear: External ear normal.     Left Ear: External ear normal.     Nose: Nose normal. No congestion or rhinorrhea.     Mouth/Throat:     Mouth: Mucous membranes are  moist.  Eyes:     General:        Right eye: No discharge.        Left eye: No discharge.     Extraocular Movements: Extraocular movements intact.     Conjunctiva/sclera: Conjunctivae normal.     Pupils: Pupils are equal, round, and reactive to light.  Cardiovascular:     Rate and Rhythm: Normal rate and regular rhythm.     Heart sounds: No murmur heard. Pulmonary:     Effort: Pulmonary effort is normal. No respiratory distress.     Breath sounds: Normal breath sounds. No wheezing, rhonchi or rales.  Abdominal:     General: Abdomen is flat. Bowel sounds are normal.  Musculoskeletal:     Cervical back: Normal range of motion and neck supple.  Skin:    General: Skin is warm and dry.     Capillary Refill: Capillary refill takes less than 2 seconds.  Neurological:     General: No focal deficit present.     Mental Status: He is alert and oriented to person, place, and time.  Psychiatric:        Mood and Affect: Mood normal.        Behavior: Behavior normal.        Thought Content: Thought content normal.  Judgment: Judgment normal.     Results for orders placed or performed in visit on 07/24/21  Comp Met (CMET)  Result Value Ref Range   Glucose 101 (H) 70 - 99 mg/dL   BUN 20 8 - 27 mg/dL   Creatinine, Ser 1.42 (H) 0.76 - 1.27 mg/dL   eGFR 51 (L) >59 mL/min/1.73   BUN/Creatinine Ratio 14 10 - 24   Sodium 142 134 - 144 mmol/L   Potassium 4.4 3.5 - 5.2 mmol/L   Chloride 104 96 - 106 mmol/L   CO2 26 20 - 29 mmol/L   Calcium 9.2 8.6 - 10.2 mg/dL   Total Protein 6.7 6.0 - 8.5 g/dL   Albumin 4.4 3.7 - 4.7 g/dL   Globulin, Total 2.3 1.5 - 4.5 g/dL   Albumin/Globulin Ratio 1.9 1.2 - 2.2   Bilirubin Total 0.7 0.0 - 1.2 mg/dL   Alkaline Phosphatase 77 44 - 121 IU/L   AST 18 0 - 40 IU/L   ALT 12 0 - 44 IU/L  Lipid Profile  Result Value Ref Range   Cholesterol, Total 87 (L) 100 - 199 mg/dL   Triglycerides 66 0 - 149 mg/dL   HDL 42 >39 mg/dL   VLDL Cholesterol Cal 15 5 -  40 mg/dL   LDL Chol Calc (NIH) 30 0 - 99 mg/dL   Chol/HDL Ratio 2.1 0.0 - 5.0 ratio      Assessment & Plan:   Problem List Items Addressed This Visit       Cardiovascular and Mediastinum   Essential (primary) hypertension - Primary    Chronic.  Controlled.  Continue with current medication regimen of Amlodipine 32m daily.   Return to clinic in 3 months for reevaluation.  Call sooner if concerns arise.          Follow up plan: Return in about 2 months (around 10/24/2021) for HTN, HLD, DM2 FU.

## 2021-08-23 NOTE — Assessment & Plan Note (Signed)
Chronic.  Controlled.  Continue with current medication regimen of Amlodipine 5mg  daily.   Return to clinic in 3 months for reevaluation.  Call sooner if concerns arise.

## 2021-10-26 ENCOUNTER — Encounter: Payer: Self-pay | Admitting: Nurse Practitioner

## 2021-10-26 ENCOUNTER — Ambulatory Visit (INDEPENDENT_AMBULATORY_CARE_PROVIDER_SITE_OTHER): Payer: Medicare Other | Admitting: Nurse Practitioner

## 2021-10-26 VITALS — BP 127/63 | HR 70 | Temp 98.7°F | Wt 151.7 lb

## 2021-10-26 DIAGNOSIS — I2510 Atherosclerotic heart disease of native coronary artery without angina pectoris: Secondary | ICD-10-CM

## 2021-10-26 DIAGNOSIS — N1831 Chronic kidney disease, stage 3a: Secondary | ICD-10-CM

## 2021-10-26 DIAGNOSIS — I700 Atherosclerosis of aorta: Secondary | ICD-10-CM | POA: Diagnosis not present

## 2021-10-26 DIAGNOSIS — I10 Essential (primary) hypertension: Secondary | ICD-10-CM

## 2021-10-26 DIAGNOSIS — J439 Emphysema, unspecified: Secondary | ICD-10-CM

## 2021-10-26 NOTE — Assessment & Plan Note (Signed)
Chronic. Found on CT Lung in 2023.  Continue with Atorvastatin.  Follow up in 3 months for reevaluation.  Call sooner if concerns arise.

## 2021-10-26 NOTE — Progress Notes (Signed)
BP 127/63   Pulse 70   Temp 98.7 F (37.1 C) (Oral)   Wt 151 lb 11.2 oz (68.8 kg)   SpO2 96%   BMI 24.86 kg/m    Subjective:    Patient ID: Drew Russell, male    DOB: [birth year], 76 y.o.   MRN: [id number]  HPI: Drew Russell is a 76 y.o. male  Chief Complaint  Patient presents with   Hypertension   Hyperlipidemia   Diabetes    2 month follow up   HYPERTENSION Hypertension status: controlled  Satisfied with current treatment? no Duration of hypertension: years BP monitoring frequency:  not checking BP range:  BP medication side effects:  no Medication compliance: excellent compliance Previous BP meds:amlodipine Aspirin: no Recurrent headaches: no Visual changes: no Palpitations: no Dyspnea: no Chest pain: no Lower extremity edema: no Dizzy/lightheaded: no  CHRONIC KIDNEY DISEASE CKD status: controlled Medications renally dose: yes Previous renal evaluation: no Pneumovax:  Up to Date Influenza Vaccine:  Not up to Date  COPD COPD status: controlled Satisfied with current treatment?: yes Oxygen use: no Dyspnea frequency: occasional  Cough frequency: none Rescue inhaler frequency:   Limitation of activity: no Productive cough:  Last Spirometry:  Pneumovax: Up to Date Influenza: Up to Date    Relevant past medical, surgical, family and social history reviewed and updated as indicated. Interim medical history since our last visit reviewed. Allergies and medications reviewed and updated.  Review of Systems  Eyes:  Negative for visual disturbance.  Respiratory:  Negative for shortness of breath.   Cardiovascular:  Negative for chest pain and leg swelling.  Neurological:  Negative for light-headedness and headaches.    Per HPI unless specifically indicated above     Objective:    BP 127/63   Pulse 70   Temp 98.7 F (37.1 C) (Oral)   Wt 151 lb 11.2 oz (68.8 kg)   SpO2 96%   BMI 24.86 kg/m   Wt Readings from Last 3 Encounters:  10/26/21  151 lb 11.2 oz (68.8 kg)  08/23/21 157 lb 12.8 oz (71.6 kg)  07/24/21 161 lb 9.6 oz (73.3 kg)    Physical Exam Vitals and nursing note reviewed.  Constitutional:      General: He is not in acute distress.    Appearance: Normal appearance. He is not ill-appearing, toxic-appearing or diaphoretic.  HENT:     Head: Normocephalic.     Right Ear: External ear normal.     Left Ear: External ear normal.     Nose: Nose normal. No congestion or rhinorrhea.     Mouth/Throat:     Mouth: Mucous membranes are moist.  Eyes:     General:        Right eye: No discharge.        Left eye: No discharge.     Extraocular Movements: Extraocular movements intact.     Conjunctiva/sclera: Conjunctivae normal.     Pupils: Pupils are equal, round, and reactive to light.  Cardiovascular:     Rate and Rhythm: Normal rate and regular rhythm.     Heart sounds: No murmur heard. Pulmonary:     Effort: Pulmonary effort is normal. No respiratory distress.     Breath sounds: Normal breath sounds. No wheezing, rhonchi or rales.  Abdominal:     General: Abdomen is flat. Bowel sounds are normal.  Musculoskeletal:     Cervical back: Normal range of motion and neck supple.  Skin:    General:  Skin is warm and dry.     Capillary Refill: Capillary refill takes less than 2 seconds.  Neurological:     General: No focal deficit present.     Mental Status: He is alert and oriented to person, place, and time.  Psychiatric:        Mood and Affect: Mood normal.        Behavior: Behavior normal.        Thought Content: Thought content normal.        Judgment: Judgment normal.     Results for orders placed or performed in visit on 07/24/21  Comp Met (CMET)  Result Value Ref Range   Glucose 101 (H) 70 - 99 mg/dL   BUN 20 8 - 27 mg/dL   Creatinine, Ser 1.42 (H) 0.76 - 1.27 mg/dL   eGFR 51 (L) >59 mL/min/1.73   BUN/Creatinine Ratio 14 10 - 24   Sodium 142 134 - 144 mmol/L   Potassium 4.4 3.5 - 5.2 mmol/L   Chloride  104 96 - 106 mmol/L   CO2 26 20 - 29 mmol/L   Calcium 9.2 8.6 - 10.2 mg/dL   Total Protein 6.7 6.0 - 8.5 g/dL   Albumin 4.4 3.7 - 4.7 g/dL   Globulin, Total 2.3 1.5 - 4.5 g/dL   Albumin/Globulin Ratio 1.9 1.2 - 2.2   Bilirubin Total 0.7 0.0 - 1.2 mg/dL   Alkaline Phosphatase 77 44 - 121 IU/L   AST 18 0 - 40 IU/L   ALT 12 0 - 44 IU/L  Lipid Profile  Result Value Ref Range   Cholesterol, Total 87 (L) 100 - 199 mg/dL   Triglycerides 66 0 - 149 mg/dL   HDL 42 >39 mg/dL   VLDL Cholesterol Cal 15 5 - 40 mg/dL   LDL Chol Calc (NIH) 30 0 - 99 mg/dL   Chol/HDL Ratio 2.1 0.0 - 5.0 ratio      Assessment & Plan:   Problem List Items Addressed This Visit       Cardiovascular and Mediastinum   Coronary artery disease    Chronic. Found on CT Lung in 2023.  Continue with Atorvastatin.  Follow up in 3 months for reevaluation.      Essential (primary) hypertension    Chronic.  Controlled.  Continue with current medication regimen of Amlodipine 50m daily.   Labs ordered today.  Return to clinic in 3 months for reevaluation.  Call sooner if concerns arise.        Relevant Orders   Comp Met (CMET)   Aortic atherosclerosis ([place] - Primary    Chronic. Found on CT Lung in 2023.  Continue with Atorvastatin.  Follow up in 3 months for reevaluation.  Call sooner if concerns arise.         Respiratory   Emphysema lung (HCC)    Chronic.  Controlled without medication.  Labs ordered today.  Return to clinic in 3 months for reevaluation.  Call sooner if concerns arise.          Genitourinary   CKD (chronic kidney disease) stage 3, GFR 30-59 ml/min (HCC)    Chronic.  Controlled.  Continue with current medication regimen.  Labs ordered today.  Return to clinic in 3 months for reevaluation.  Call sooner if concerns arise.           Follow up plan: Return in about 3 months (around 01/25/2022) for HTN, HLD, DM2 FU.

## 2021-10-26 NOTE — Assessment & Plan Note (Signed)
Chronic. Found on CT Lung in 2023.  Continue with Atorvastatin.  Follow up in 3 months for reevaluation.

## 2021-10-26 NOTE — Assessment & Plan Note (Signed)
Chronic.  Controlled.  Continue with current medication regimen.  Labs ordered today.  Return to clinic in 3 months for reevaluation.  Call sooner if concerns arise.   

## 2021-10-26 NOTE — Assessment & Plan Note (Signed)
Chronic.  Controlled without medication.  Labs ordered today.  Return to clinic in 3 months for reevaluation.  Call sooner if concerns arise.  ? ?

## 2021-10-26 NOTE — Assessment & Plan Note (Signed)
Chronic.  Controlled.  Continue with current medication regimen of Amlodipine 5mg daily.  Labs ordered today.  Return to clinic in 3 months for reevaluation.  Call sooner if concerns arise.   

## 2021-10-27 LAB — COMPREHENSIVE METABOLIC PANEL
ALT: 16 IU/L (ref 0–44)
AST: 25 IU/L (ref 0–40)
Albumin/Globulin Ratio: 2.1 (ref 1.2–2.2)
Albumin: 4.4 g/dL (ref 3.8–4.8)
Alkaline Phosphatase: 69 IU/L (ref 44–121)
BUN/Creatinine Ratio: 14 (ref 10–24)
BUN: 21 mg/dL (ref 8–27)
Bilirubin Total: 1.1 mg/dL (ref 0.0–1.2)
CO2: 22 mmol/L (ref 20–29)
Calcium: 8.8 mg/dL (ref 8.6–10.2)
Chloride: 105 mmol/L (ref 96–106)
Creatinine, Ser: 1.51 mg/dL — ABNORMAL HIGH (ref 0.76–1.27)
Globulin, Total: 2.1 g/dL (ref 1.5–4.5)
Glucose: 93 mg/dL (ref 70–99)
Potassium: 4.3 mmol/L (ref 3.5–5.2)
Sodium: 142 mmol/L (ref 134–144)
Total Protein: 6.5 g/dL (ref 6.0–8.5)
eGFR: 47 mL/min/{1.73_m2} — ABNORMAL LOW (ref >59)

## 2021-10-27 NOTE — Progress Notes (Signed)
Please let patient know that his lab work is consistent with prior.  No concerns at this time.  Continue with current medication regimen.

## 2022-01-22 ENCOUNTER — Other Ambulatory Visit: Payer: Self-pay | Admitting: Nurse Practitioner

## 2022-01-22 NOTE — Telephone Encounter (Signed)
Requested Prescriptions  Pending Prescriptions Disp Refills   amLODipine (NORVASC) 5 MG tablet [Pharmacy Med Name: AMLODIPINE BESYLATE 5 MG TAB] 90 tablet 1    Sig: TAKE 1 TABLET (5 MG TOTAL) BY MOUTH DAILY.     Cardiovascular: Calcium Channel Blockers 2 Passed - 01/22/2022  1:42 AM      Passed - Last BP in normal range    BP Readings from Last 1 Encounters:  10/26/21 127/63         Passed - Last Heart Rate in normal range    Pulse Readings from Last 1 Encounters:  10/26/21 70         Passed - Valid encounter within last 6 months    Recent Outpatient Visits           2 months ago Aortic atherosclerosis (HCC)   [facility] Larae Grooms, NP   5 months ago Essential (primary) hypertension   [facility] Larae Grooms, NP   6 months ago Aortic atherosclerosis (HCC)   [facility] Larae Grooms, NP   9 months ago Pulmonary emphysema, unspecified emphysema type (HCC)   [facility] Larae Grooms, NP   1 year ago Essential (primary) hypertension   [facility], Demetrios Isaacs, MD       Future Appointments             In 3 days Larae Grooms, NP [facility]

## 2022-01-25 ENCOUNTER — Encounter: Payer: Self-pay | Admitting: Nurse Practitioner

## 2022-01-25 ENCOUNTER — Ambulatory Visit (INDEPENDENT_AMBULATORY_CARE_PROVIDER_SITE_OTHER): Payer: Medicare Other | Admitting: Nurse Practitioner

## 2022-01-25 ENCOUNTER — Other Ambulatory Visit: Payer: Self-pay | Admitting: Nurse Practitioner

## 2022-01-25 VITALS — BP 155/74 | HR 83 | Temp 98.4°F | Wt 151.8 lb

## 2022-01-25 DIAGNOSIS — J439 Emphysema, unspecified: Secondary | ICD-10-CM | POA: Diagnosis not present

## 2022-01-25 DIAGNOSIS — I2510 Atherosclerotic heart disease of native coronary artery without angina pectoris: Secondary | ICD-10-CM | POA: Diagnosis not present

## 2022-01-25 DIAGNOSIS — E781 Pure hyperglyceridemia: Secondary | ICD-10-CM

## 2022-01-25 DIAGNOSIS — I10 Essential (primary) hypertension: Secondary | ICD-10-CM

## 2022-01-25 DIAGNOSIS — I700 Atherosclerosis of aorta: Secondary | ICD-10-CM | POA: Diagnosis not present

## 2022-01-25 DIAGNOSIS — N1831 Chronic kidney disease, stage 3a: Secondary | ICD-10-CM | POA: Diagnosis not present

## 2022-01-25 MED ORDER — AMLODIPINE BESYLATE 10 MG PO TABS: 10.0000 mg | ORAL_TABLET | Freq: Every day | ORAL | 1 refills | Status: DC

## 2022-01-25 NOTE — Assessment & Plan Note (Signed)
Chronic. Found on CT Lung in 2023.  Continue with Atorvastatin.  Labs ordered today.  Follow up in 3 months for reevaluation. 

## 2022-01-25 NOTE — Progress Notes (Signed)
BP (!) 155/74   Pulse 83   Temp 98.4 F (36.9 C) (Oral)   Wt 151 lb 12.8 oz (68.9 kg)   SpO2 98%   BMI 24.88 kg/m    Subjective:    Patient ID: Drew Russell, male    DOB: April 08, 1945, 76 y.o.   MRN: [id number]  HPI: [name] is a 76 y.o. male  Chief Complaint  Patient presents with   Hyperlipidemia   Hypertension   HYPERTENSION Hypertension status: controlled  Satisfied with current treatment? no Duration of hypertension: years BP monitoring frequency:  not checking BP range:  BP medication side effects:  no Medication compliance: excellent compliance Previous BP meds:amlodipine Aspirin: no Recurrent headaches: no Visual changes: no Palpitations: no Dyspnea: no Chest pain: no Lower extremity edema: no Dizzy/lightheaded: no  CHRONIC KIDNEY DISEASE CKD status: controlled Medications renally dose: yes Previous renal evaluation: no Pneumovax:  Up to Date Influenza Vaccine:  Not up to Date  COPD COPD status: controlled Satisfied with current treatment?: yes Oxygen use: no Dyspnea frequency: occasional  Cough frequency: none Rescue inhaler frequency:   Limitation of activity: no Productive cough:  Last Spirometry:  Pneumovax: Up to Date Influenza: Up to Date    Relevant past medical, surgical, family and social history reviewed and updated as indicated. Interim medical history since our last visit reviewed. Allergies and medications reviewed and updated.  Review of Systems  Eyes:  Negative for visual disturbance.  Respiratory:  Negative for shortness of breath.   Cardiovascular:  Negative for chest pain and leg swelling.  Neurological:  Negative for light-headedness and headaches.    Per HPI unless specifically indicated above     Objective:    BP (!) 155/74   Pulse 83   Temp 98.4 F (36.9 C) (Oral)   Wt 151 lb 12.8 oz (68.9 kg)   SpO2 98%   BMI 24.88 kg/m   Wt Readings from Last 3 Encounters:  01/25/22 151 lb 12.8 oz (68.9 kg)   10/26/21 151 lb 11.2 oz (68.8 kg)  08/23/21 157 lb 12.8 oz (71.6 kg)    Physical Exam Vitals and nursing note reviewed.  Constitutional:      General: He is not in acute distress.    Appearance: Normal appearance. He is not ill-appearing, toxic-appearing or diaphoretic.  HENT:     Head: Normocephalic.     Right Ear: External ear normal.     Left Ear: External ear normal.     Nose: Nose normal. No congestion or rhinorrhea.     Mouth/Throat:     Mouth: Mucous membranes are moist.  Eyes:     General:        Right eye: No discharge.        Left eye: No discharge.     Extraocular Movements: Extraocular movements intact.     Conjunctiva/sclera: Conjunctivae normal.     Pupils: Pupils are equal, round, and reactive to light.  Cardiovascular:     Rate and Rhythm: Normal rate and regular rhythm.     Heart sounds: No murmur heard. Pulmonary:     Effort: Pulmonary effort is normal. No respiratory distress.     Breath sounds: Normal breath sounds. No wheezing, rhonchi or rales.  Abdominal:     General: Abdomen is flat. Bowel sounds are normal.  Musculoskeletal:     Cervical back: Normal range of motion and neck supple.  Skin:    General: Skin is warm and dry.  Capillary Refill: Capillary refill takes less than 2 seconds.  Neurological:     General: No focal deficit present.     Mental Status: He is alert and oriented to person, place, and time.  Psychiatric:        Mood and Affect: Mood normal.        Behavior: Behavior normal.        Thought Content: Thought content normal.        Judgment: Judgment normal.     Results for orders placed or performed in visit on 10/26/21  Comp Met (CMET)  Result Value Ref Range   Glucose 93 70 - 99 mg/dL   BUN 21 8 - 27 mg/dL   Creatinine, Ser 1.51 (H) 0.76 - 1.27 mg/dL   eGFR 47 (L) >59 mL/min/1.73   BUN/Creatinine Ratio 14 10 - 24   Sodium 142 134 - 144 mmol/L   Potassium 4.3 3.5 - 5.2 mmol/L   Chloride 105 96 - 106 mmol/L   CO2 22  20 - 29 mmol/L   Calcium 8.8 8.6 - 10.2 mg/dL   Total Protein 6.5 6.0 - 8.5 g/dL   Albumin 4.4 3.8 - 4.8 g/dL   Globulin, Total 2.1 1.5 - 4.5 g/dL   Albumin/Globulin Ratio 2.1 1.2 - 2.2   Bilirubin Total 1.1 0.0 - 1.2 mg/dL   Alkaline Phosphatase 69 44 - 121 IU/L   AST 25 0 - 40 IU/L   ALT 16 0 - 44 IU/L      Assessment & Plan:   Problem List Items Addressed This Visit       Cardiovascular and Mediastinum   Coronary artery disease    Chronic. Found on CT Lung in 2023.  Continue with Atorvastatin.  Labs ordered today.  Follow up in 3 months for reevaluation.      Relevant Medications   amLODipine (NORVASC) 10 MG tablet   Other Relevant Orders   Comp Met (CMET)   Lipid Profile   Essential (primary) hypertension    Chroinc. Not well controlled.  Has been trending up at recent appointments.  Will increase Amlodipine to 91m. Labs ordered today. Follow up in 1 month.  Call sooner if concerns arise.       Relevant Medications   amLODipine (NORVASC) 10 MG tablet   Other Relevant Orders   Comp Met (CMET)   Aortic atherosclerosis ([facility] - Primary    Chronic. Found on CT Lung in 2023.  Continue with Atorvastatin.  Labs ordered today.  Follow up in 3 months for reevaluation.  Call sooner if concerns arise.       Relevant Medications   amLODipine (NORVASC) 10 MG tablet     Respiratory   Emphysema lung (HCC)    Chronic.  Controlled without medication.  Labs ordered today.  Return to clinic in 3 months for reevaluation.  Call sooner if concerns arise.          Genitourinary   CKD (chronic kidney disease) stage 3, GFR 30-59 ml/min (HCC)    Chronic.  Controlled.  Continue with current medication regimen.  Will work to keep blood pressure well controlled.  Labs ordered today.  Return to clinic in 3 months for reevaluation.  Call sooner if concerns arise.           Other   Hypertriglyceridemia    Chronic.  Controlled.  Continue with current medication regimen.  Labs ordered  today.  Return to clinic in 3 months for reevaluation.  Call sooner  if concerns arise.         Relevant Medications   amLODipine (NORVASC) 10 MG tablet   Other Relevant Orders   Lipid Profile     Follow up plan: Return in about 1 month (around 02/25/2022) for BP Check.

## 2022-01-25 NOTE — Assessment & Plan Note (Signed)
Chronic.  Controlled.  Continue with current medication regimen.  Will work to keep blood pressure well controlled.  Labs ordered today.  Return to clinic in 3 months for reevaluation.  Call sooner if concerns arise.

## 2022-01-25 NOTE — Assessment & Plan Note (Signed)
Chroinc. Not well controlled.  Has been trending up at recent appointments.  Will increase Amlodipine to 10mg . Labs ordered today. Follow up in 1 month.  Call sooner if concerns arise.

## 2022-01-25 NOTE — Assessment & Plan Note (Signed)
Chronic.  Controlled.  Continue with current medication regimen.  Labs ordered today.  Return to clinic in 3 months for reevaluation.  Call sooner if concerns arise.   

## 2022-01-25 NOTE — Assessment & Plan Note (Signed)
Chronic. Found on CT Lung in 2023.  Continue with Atorvastatin.  Labs ordered today.  Follow up in 3 months for reevaluation.  Call sooner if concerns arise.  

## 2022-01-25 NOTE — Assessment & Plan Note (Signed)
Chronic.  Controlled without medication.  Labs ordered today.  Return to clinic in 3 months for reevaluation.  Call sooner if concerns arise.  ? ?

## 2022-01-26 LAB — COMPREHENSIVE METABOLIC PANEL
ALT: 14 IU/L (ref 0–44)
AST: 19 IU/L (ref 0–40)
Albumin/Globulin Ratio: 2.4 — ABNORMAL HIGH (ref 1.2–2.2)
Albumin: 4.7 g/dL (ref 3.8–4.8)
Alkaline Phosphatase: 78 IU/L (ref 44–121)
BUN/Creatinine Ratio: 13 (ref 10–24)
BUN: 17 mg/dL (ref 8–27)
Bilirubin Total: 0.7 mg/dL (ref 0.0–1.2)
CO2: 22 mmol/L (ref 20–29)
Calcium: 9.1 mg/dL (ref 8.6–10.2)
Chloride: 102 mmol/L (ref 96–106)
Creatinine, Ser: 1.36 mg/dL — ABNORMAL HIGH (ref 0.76–1.27)
Globulin, Total: 2 g/dL (ref 1.5–4.5)
Glucose: 80 mg/dL (ref 70–99)
Potassium: 4.3 mmol/L (ref 3.5–5.2)
Sodium: 143 mmol/L (ref 134–144)
Total Protein: 6.7 g/dL (ref 6.0–8.5)
eGFR: 53 mL/min/{1.73_m2} — ABNORMAL LOW (ref >59)

## 2022-01-26 LAB — LIPID PANEL
Chol/HDL Ratio: 3 ratio (ref 0.0–5.0)
Cholesterol, Total: 139 mg/dL (ref 100–199)
HDL: 47 mg/dL (ref >39)
LDL Chol Calc (NIH): 78 mg/dL (ref 0–99)
Triglycerides: 71 mg/dL (ref 0–149)
VLDL Cholesterol Cal: 14 mg/dL (ref 5–40)

## 2022-01-26 NOTE — Progress Notes (Signed)
Please let patient know that his lab work looks great.  We will continue to monitor in the future.  No concerns at this time.

## 2022-02-27 ENCOUNTER — Ambulatory Visit (INDEPENDENT_AMBULATORY_CARE_PROVIDER_SITE_OTHER): Payer: 59 | Admitting: Nurse Practitioner

## 2022-02-27 ENCOUNTER — Encounter: Payer: Self-pay | Admitting: Nurse Practitioner

## 2022-02-27 VITALS — BP 128/62 | HR 77 | Temp 97.9°F | Wt 147.1 lb

## 2022-02-27 DIAGNOSIS — I10 Essential (primary) hypertension: Secondary | ICD-10-CM

## 2022-02-27 NOTE — Progress Notes (Signed)
BP 128/62 (BP Location: Right Arm, Cuff Size: Normal)   Pulse 77   Temp 97.9 F (36.6 C) (Oral)   Wt 147 lb 1.6 oz (66.7 kg)   SpO2 96%   BMI 24.11 kg/m    Subjective:    Patient ID: Drew Russell, male    DOB: 15-Apr-1945, 77 y.o.   MRN: [id number]  HPI: Drew Russell is a 77 y.o. male  Chief Complaint  Patient presents with   Hypertension   HYPERTENSION with Chronic Kidney Disease Hypertension status: controlled  Satisfied with current treatment? yes Duration of hypertension: years BP monitoring frequency:  not checking BP range:  BP medication side effects:  no Medication compliance: excellent compliance Previous BP meds:amlodipine Aspirin: no Recurrent headaches: no Visual changes: no Palpitations: no Dyspnea: no Chest pain: no Lower extremity edema: no Dizzy/lightheaded: no  Relevant past medical, surgical, family and social history reviewed and updated as indicated. Interim medical history since our last visit reviewed. Allergies and medications reviewed and updated.  Review of Systems  Eyes:  Negative for visual disturbance.  Respiratory:  Negative for shortness of breath.   Cardiovascular:  Negative for chest pain and leg swelling.  Neurological:  Negative for light-headedness and headaches.    Per HPI unless specifically indicated above     Objective:    BP 128/62 (BP Location: Right Arm, Cuff Size: Normal)   Pulse 77   Temp 97.9 F (36.6 C) (Oral)   Wt 147 lb 1.6 oz (66.7 kg)   SpO2 96%   BMI 24.11 kg/m   Wt Readings from Last 3 Encounters:  02/27/22 147 lb 1.6 oz (66.7 kg)  01/25/22 151 lb 12.8 oz (68.9 kg)  10/26/21 151 lb 11.2 oz (68.8 kg)    Physical Exam Vitals and nursing note reviewed.  Constitutional:      General: He is not in acute distress.    Appearance: Normal appearance. He is not ill-appearing, toxic-appearing or diaphoretic.  HENT:     Head: Normocephalic.     Right Ear: External ear normal.     Left Ear: External  ear normal.     Nose: Nose normal. No congestion or rhinorrhea.     Mouth/Throat:     Mouth: Mucous membranes are moist.  Eyes:     General:        Right eye: No discharge.        Left eye: No discharge.     Extraocular Movements: Extraocular movements intact.     Conjunctiva/sclera: Conjunctivae normal.     Pupils: Pupils are equal, round, and reactive to light.  Cardiovascular:     Rate and Rhythm: Normal rate and regular rhythm.     Heart sounds: No murmur heard. Pulmonary:     Effort: Pulmonary effort is normal. No respiratory distress.     Breath sounds: Normal breath sounds. No wheezing, rhonchi or rales.  Abdominal:     General: Abdomen is flat. Bowel sounds are normal.  Musculoskeletal:     Cervical back: Normal range of motion and neck supple.  Skin:    General: Skin is warm and dry.     Capillary Refill: Capillary refill takes less than 2 seconds.  Neurological:     General: No focal deficit present.     Mental Status: He is alert and oriented to person, place, and time.  Psychiatric:        Mood and Affect: Mood normal.        Behavior:  Behavior normal.        Thought Content: Thought content normal.        Judgment: Judgment normal.     Results for orders placed or performed in visit on 01/25/22  Comp Met (CMET)  Result Value Ref Range   Glucose 80 70 - 99 mg/dL   BUN 17 8 - 27 mg/dL   Creatinine, Ser 1.36 (H) 0.76 - 1.27 mg/dL   eGFR 53 (L) >59 mL/min/1.73   BUN/Creatinine Ratio 13 10 - 24   Sodium 143 134 - 144 mmol/L   Potassium 4.3 3.5 - 5.2 mmol/L   Chloride 102 96 - 106 mmol/L   CO2 22 20 - 29 mmol/L   Calcium 9.1 8.6 - 10.2 mg/dL   Total Protein 6.7 6.0 - 8.5 g/dL   Albumin 4.7 3.8 - 4.8 g/dL   Globulin, Total 2.0 1.5 - 4.5 g/dL   Albumin/Globulin Ratio 2.4 (H) 1.2 - 2.2   Bilirubin Total 0.7 0.0 - 1.2 mg/dL   Alkaline Phosphatase 78 44 - 121 IU/L   AST 19 0 - 40 IU/L   ALT 14 0 - 44 IU/L  Lipid Profile  Result Value Ref Range    Cholesterol, Total 139 100 - 199 mg/dL   Triglycerides 71 0 - 149 mg/dL   HDL 47 >39 mg/dL   VLDL Cholesterol Cal 14 5 - 40 mg/dL   LDL Chol Calc (NIH) 78 0 - 99 mg/dL   Chol/HDL Ratio 3.0 0.0 - 5.0 ratio      Assessment & Plan:   Problem List Items Addressed This Visit       Cardiovascular and Mediastinum   Essential (primary) hypertension - Primary    Chronic.  Controlled.  Continue with current medication regimen with amlodipine 10mg .  Return to clinic in 2 months for reevaluation.  Call sooner if concerns arise.          Follow up plan: Return in about 2 months (around 04/28/2022) for HTN, HLD, DM2 FU.

## 2022-02-27 NOTE — Assessment & Plan Note (Signed)
Chronic.  Controlled.  Continue with current medication regimen with amlodipine 10mg .  Return to clinic in 2 months for reevaluation.  Call sooner if concerns arise.

## 2022-04-30 ENCOUNTER — Encounter: Payer: Self-pay | Admitting: Nurse Practitioner

## 2022-04-30 ENCOUNTER — Ambulatory Visit (INDEPENDENT_AMBULATORY_CARE_PROVIDER_SITE_OTHER): Payer: 59 | Admitting: Nurse Practitioner

## 2022-04-30 VITALS — BP 121/68 | HR 89 | Temp 98.8°F | Wt 148.3 lb

## 2022-04-30 DIAGNOSIS — I2510 Atherosclerotic heart disease of native coronary artery without angina pectoris: Secondary | ICD-10-CM | POA: Diagnosis not present

## 2022-04-30 DIAGNOSIS — N1831 Chronic kidney disease, stage 3a: Secondary | ICD-10-CM

## 2022-04-30 DIAGNOSIS — I700 Atherosclerosis of aorta: Secondary | ICD-10-CM | POA: Diagnosis not present

## 2022-04-30 DIAGNOSIS — E781 Pure hyperglyceridemia: Secondary | ICD-10-CM

## 2022-04-30 DIAGNOSIS — I10 Essential (primary) hypertension: Secondary | ICD-10-CM

## 2022-04-30 DIAGNOSIS — L089 Local infection of the skin and subcutaneous tissue, unspecified: Secondary | ICD-10-CM

## 2022-04-30 DIAGNOSIS — J439 Emphysema, unspecified: Secondary | ICD-10-CM | POA: Diagnosis not present

## 2022-04-30 NOTE — Assessment & Plan Note (Signed)
Chronic.  Controlled.  Continue with current medication regimen.  Labs ordered today.  Return to clinic in 3 months for reevaluation.  Call sooner if concerns arise.   

## 2022-04-30 NOTE — Assessment & Plan Note (Signed)
Chronic.  Controlled without medication.  Labs ordered today.  Return to clinic in 3 months for reevaluation.  Call sooner if concerns arise.  ? ?

## 2022-04-30 NOTE — Progress Notes (Signed)
BP 121/68   Pulse 89   Temp 98.8 F (37.1 C) (Oral)   Wt 148 lb 4.8 oz (67.3 kg)   SpO2 98%   BMI 24.30 kg/m    Subjective:    Patient ID: Drew Russell, male    DOB: May 25, 1945, 77 y.o.   MRN: [id number]  HPI: Drew Russell is a 77 y.o. male  Chief Complaint  Patient presents with   Hyperlipidemia   Hypertension   HYPERTENSION Hypertension status: controlled  Satisfied with current treatment? no Duration of hypertension: years BP monitoring frequency:  not checking BP range:  BP medication side effects:  no Medication compliance: excellent compliance Previous BP meds:amlodipine Aspirin: no Recurrent headaches: no Visual changes: no Palpitations: no Dyspnea: no Chest pain: no Lower extremity edema: no Dizzy/lightheaded: no  CHRONIC KIDNEY DISEASE CKD status: controlled Medications renally dose: yes Previous renal evaluation: no Pneumovax:  Up to Date Influenza Vaccine:  Not up to Date  COPD COPD status: controlled Satisfied with current treatment?: yes Oxygen use: no Dyspnea frequency: occasional  Cough frequency: none Rescue inhaler frequency:   Limitation of activity: no Productive cough:  Last Spirometry:  Pneumovax: Up to Date Influenza: Up to Date    Relevant past medical, surgical, family and social history reviewed and updated as indicated. Interim medical history since our last visit reviewed. Allergies and medications reviewed and updated.  Review of Systems  Eyes:  Negative for visual disturbance.  Respiratory:  Negative for shortness of breath.   Cardiovascular:  Negative for chest pain and leg swelling.  Skin:        Ear scab  Neurological:  Negative for light-headedness and headaches.    Per HPI unless specifically indicated above     Objective:    BP 121/68   Pulse 89   Temp 98.8 F (37.1 C) (Oral)   Wt 148 lb 4.8 oz (67.3 kg)   SpO2 98%   BMI 24.30 kg/m   Wt Readings from Last 3 Encounters:  04/30/22 148 lb 4.8  oz (67.3 kg)  02/27/22 147 lb 1.6 oz (66.7 kg)  01/25/22 151 lb 12.8 oz (68.9 kg)    Physical Exam Vitals and nursing note reviewed.  Constitutional:      General: He is not in acute distress.    Appearance: Normal appearance. He is not ill-appearing, toxic-appearing or diaphoretic.  HENT:     Head: Normocephalic.      Right Ear: External ear normal.     Left Ear: External ear normal.     Nose: Nose normal. No congestion or rhinorrhea.     Mouth/Throat:     Mouth: Mucous membranes are moist.  Eyes:     General:        Right eye: No discharge.        Left eye: No discharge.     Extraocular Movements: Extraocular movements intact.     Conjunctiva/sclera: Conjunctivae normal.     Pupils: Pupils are equal, round, and reactive to light.  Cardiovascular:     Rate and Rhythm: Normal rate and regular rhythm.     Heart sounds: No murmur heard. Pulmonary:     Effort: Pulmonary effort is normal. No respiratory distress.     Breath sounds: Normal breath sounds. No wheezing, rhonchi or rales.  Abdominal:     General: Abdomen is flat. Bowel sounds are normal.  Musculoskeletal:     Cervical back: Normal range of motion and neck supple.  Skin:  General: Skin is warm and dry.     Capillary Refill: Capillary refill takes less than 2 seconds.  Neurological:     General: No focal deficit present.     Mental Status: He is alert and oriented to person, place, and time.  Psychiatric:        Mood and Affect: Mood normal.        Behavior: Behavior normal.        Thought Content: Thought content normal.        Judgment: Judgment normal.     Results for orders placed or performed in visit on 01/25/22  Comp Met (CMET)  Result Value Ref Range   Glucose 80 70 - 99 mg/dL   BUN 17 8 - 27 mg/dL   Creatinine, Ser 1.36 (H) 0.76 - 1.27 mg/dL   eGFR 53 (L) >59 mL/min/1.73   BUN/Creatinine Ratio 13 10 - 24   Sodium 143 134 - 144 mmol/L   Potassium 4.3 3.5 - 5.2 mmol/L   Chloride 102 96 - 106  mmol/L   CO2 22 20 - 29 mmol/L   Calcium 9.1 8.6 - 10.2 mg/dL   Total Protein 6.7 6.0 - 8.5 g/dL   Albumin 4.7 3.8 - 4.8 g/dL   Globulin, Total 2.0 1.5 - 4.5 g/dL   Albumin/Globulin Ratio 2.4 (H) 1.2 - 2.2   Bilirubin Total 0.7 0.0 - 1.2 mg/dL   Alkaline Phosphatase 78 44 - 121 IU/L   AST 19 0 - 40 IU/L   ALT 14 0 - 44 IU/L  Lipid Profile  Result Value Ref Range   Cholesterol, Total 139 100 - 199 mg/dL   Triglycerides 71 0 - 149 mg/dL   HDL 47 >39 mg/dL   VLDL Cholesterol Cal 14 5 - 40 mg/dL   LDL Chol Calc (NIH) 78 0 - 99 mg/dL   Chol/HDL Ratio 3.0 0.0 - 5.0 ratio      Assessment & Plan:   Problem List Items Addressed This Visit       Cardiovascular and Mediastinum   Coronary artery disease - Primary    Chronic. Found on CT Lung in 2023.  Continue with Atorvastatin.  Labs ordered today.  Follow up in 3 months for reevaluation.      Essential (primary) hypertension    Chronic.  Controlled.  Continue with current medication regimen with amlodipine '10mg'$ .  Return to clinic in 3 months for reevaluation.  Call sooner if concerns arise.        Relevant Orders   Comp Met (CMET)   Aortic atherosclerosis (HCC)    Chronic. Found on CT Lung in 2023.  Continue with Atorvastatin.  Labs ordered today.  Follow up in 3 months for reevaluation.  Call sooner if concerns arise.         Respiratory   Emphysema lung (HCC)    Chronic.  Controlled without medication.  Labs ordered today.  Return to clinic in 3 months for reevaluation.  Call sooner if concerns arise.          Genitourinary   CKD (chronic kidney disease) stage 3, GFR 30-59 ml/min (HCC)    Labs ordered at visit today.  Will make recommendations based on lab results.        Relevant Orders   CBC w/Diff     Other   Hypertriglyceridemia    Chronic.  Controlled.  Continue with current medication regimen.  Labs ordered today.  Return to clinic in 3 months for reevaluation.  Call sooner if concerns arise.          Relevant Orders   Lipid Profile   Other Visit Diagnoses     Recurrent infection of skin       Ongoing x a couple of months.  Declines referral to dermatology.  Discussed risk for skin cancer.        Follow up plan: Return in about 3 months (around 07/31/2022) for HTN, HLD, DM2 FU.

## 2022-04-30 NOTE — Assessment & Plan Note (Signed)
Labs ordered at visit today.  Will make recommendations based on lab results.   

## 2022-04-30 NOTE — Assessment & Plan Note (Signed)
Chronic.  Controlled.  Continue with current medication regimen with amlodipine '10mg'$ .  Return to clinic in 3 months for reevaluation.  Call sooner if concerns arise.

## 2022-04-30 NOTE — Assessment & Plan Note (Signed)
Chronic. Found on CT Lung in 2023.  Continue with Atorvastatin.  Labs ordered today.  Follow up in 3 months for reevaluation.  Call sooner if concerns arise.

## 2022-04-30 NOTE — Assessment & Plan Note (Signed)
Chronic. Found on CT Lung in 2023.  Continue with Atorvastatin.  Labs ordered today.  Follow up in 3 months for reevaluation.

## 2022-05-01 LAB — COMPREHENSIVE METABOLIC PANEL
ALT: 11 IU/L (ref 0–44)
AST: 17 IU/L (ref 0–40)
Albumin/Globulin Ratio: 2.1 (ref 1.2–2.2)
Albumin: 4.7 g/dL (ref 3.8–4.8)
Alkaline Phosphatase: 76 IU/L (ref 44–121)
BUN/Creatinine Ratio: 10 (ref 10–24)
BUN: 15 mg/dL (ref 8–27)
Bilirubin Total: 1 mg/dL (ref 0.0–1.2)
CO2: 25 mmol/L (ref 20–29)
Calcium: 9.4 mg/dL (ref 8.6–10.2)
Chloride: 102 mmol/L (ref 96–106)
Creatinine, Ser: 1.48 mg/dL — ABNORMAL HIGH (ref 0.76–1.27)
Globulin, Total: 2.2 g/dL (ref 1.5–4.5)
Glucose: 79 mg/dL (ref 70–99)
Potassium: 4.4 mmol/L (ref 3.5–5.2)
Sodium: 142 mmol/L (ref 134–144)
Total Protein: 6.9 g/dL (ref 6.0–8.5)
eGFR: 48 mL/min/{1.73_m2} — ABNORMAL LOW (ref >59)

## 2022-05-01 LAB — LIPID PANEL
Chol/HDL Ratio: 2.6 ratio (ref 0.0–5.0)
Cholesterol, Total: 134 mg/dL (ref 100–199)
HDL: 51 mg/dL (ref >39)
LDL Chol Calc (NIH): 70 mg/dL (ref 0–99)
Triglycerides: 59 mg/dL (ref 0–149)
VLDL Cholesterol Cal: 13 mg/dL (ref 5–40)

## 2022-05-01 LAB — CBC WITH DIFFERENTIAL/PLATELET
Basophils Absolute: 0.1 10*3/uL (ref 0.0–0.2)
Basos: 1 %
EOS (ABSOLUTE): 0.2 10*3/uL (ref 0.0–0.4)
Eos: 3 %
Hematocrit: 36.3 % — ABNORMAL LOW (ref 37.5–51.0)
Hemoglobin: 12.2 g/dL — ABNORMAL LOW (ref 13.0–17.7)
Immature Grans (Abs): 0 10*3/uL (ref 0.0–0.1)
Immature Granulocytes: 0 %
Lymphocytes Absolute: 1.4 10*3/uL (ref 0.7–3.1)
Lymphs: 27 %
MCH: 32.9 pg (ref 26.6–33.0)
MCHC: 33.6 g/dL (ref 31.5–35.7)
MCV: 98 fL — ABNORMAL HIGH (ref 79–97)
Monocytes Absolute: 0.3 10*3/uL (ref 0.1–0.9)
Monocytes: 7 %
Neutrophils Absolute: 3.2 10*3/uL (ref 1.4–7.0)
Neutrophils: 62 %
Platelets: 223 10*3/uL (ref 150–450)
RBC: 3.71 x10E6/uL — ABNORMAL LOW (ref 4.14–5.80)
RDW: 14.1 % (ref 11.6–15.4)
WBC: 5.2 10*3/uL (ref 3.4–10.8)

## 2022-05-01 NOTE — Progress Notes (Signed)
Please let patient know that his lab work shows that his kidney function is stable.  He does have some anemia but this could be due to his decreased kidney function.  We will keep an eye on its.  Continue with current medication regimen.  Follow up as discussed.

## 2022-06-12 ENCOUNTER — Ambulatory Visit: Payer: 59

## 2022-07-20 ENCOUNTER — Telehealth: Payer: Self-pay | Admitting: Nurse Practitioner

## 2022-07-20 NOTE — Telephone Encounter (Signed)
Copied from CRM [id number]. Topic: Medicare AWV >> Jul 20, 2022  1:59 PM Payton Doughty wrote: Reason for CRM: LM 07/20/2022 to schedule AWV  Verlee Rossetti; Care Guide Ambulatory Clinical Support [facility] Direct Dial: [phone]

## 2022-07-31 ENCOUNTER — Encounter: Payer: Self-pay | Admitting: Nurse Practitioner

## 2022-07-31 ENCOUNTER — Ambulatory Visit (INDEPENDENT_AMBULATORY_CARE_PROVIDER_SITE_OTHER): Payer: 59 | Admitting: Nurse Practitioner

## 2022-07-31 VITALS — BP 138/70 | HR 64 | Temp 97.6°F | Wt 137.6 lb

## 2022-07-31 DIAGNOSIS — I2510 Atherosclerotic heart disease of native coronary artery without angina pectoris: Secondary | ICD-10-CM

## 2022-07-31 DIAGNOSIS — N1831 Chronic kidney disease, stage 3a: Secondary | ICD-10-CM

## 2022-07-31 DIAGNOSIS — I10 Essential (primary) hypertension: Secondary | ICD-10-CM | POA: Diagnosis not present

## 2022-07-31 DIAGNOSIS — I700 Atherosclerosis of aorta: Secondary | ICD-10-CM

## 2022-07-31 DIAGNOSIS — E781 Pure hyperglyceridemia: Secondary | ICD-10-CM

## 2022-07-31 DIAGNOSIS — J439 Emphysema, unspecified: Secondary | ICD-10-CM | POA: Diagnosis not present

## 2022-07-31 MED ORDER — AMLODIPINE BESYLATE 10 MG PO TABS: 10.0000 mg | ORAL_TABLET | Freq: Every day | ORAL | 1 refills | Status: DC

## 2022-07-31 MED ORDER — ATORVASTATIN CALCIUM 20 MG PO TABS: 20.0000 mg | ORAL_TABLET | Freq: Every day | ORAL | 1 refills | Status: DC

## 2022-07-31 NOTE — Assessment & Plan Note (Signed)
Chronic.  Controlled without medication.  Will add medication if needed based on patient's symptoms.  Labs ordered today.  Return to clinic in 4 months for reevaluation.  Call sooner if concerns arise.

## 2022-07-31 NOTE — Progress Notes (Signed)
BP 138/70   Pulse 64   Temp 97.6 F (36.4 C) (Oral)   Wt 137 lb 9.6 oz (62.4 kg)   SpO2 99%   BMI 22.55 kg/m    Subjective:    Patient ID: Drew Russell, male    DOB: [birth year], 77 y.o.   MRN: [id number]  HPI: Drew Russell is a 77 y.o. male  Chief Complaint  Patient presents with   Hypertension   HYPERTENSION Patient has not been taking amlodipine 10mg .  He has only been taking 5mg .   Hypertension status: controlled  Satisfied with current treatment? no Duration of hypertension: years BP monitoring frequency:  not checking BP range:  BP medication side effects:  no Medication compliance: excellent compliance Previous BP meds:amlodipine Aspirin: no Recurrent headaches: no Visual changes: no Palpitations: no Dyspnea: no Chest pain: no Lower extremity edema: no Dizzy/lightheaded: no  CHRONIC KIDNEY DISEASE CKD status: controlled Medications renally dose: yes Previous renal evaluation: no Pneumovax:  Up to Date Influenza Vaccine:  Not up to Date  COPD Doing well with no concerns.   COPD status: controlled Satisfied with current treatment?: yes Oxygen use: no Dyspnea frequency: occasional  Cough frequency: none Rescue inhaler frequency:   Limitation of activity: no Productive cough:  Last Spirometry:  Pneumovax: Up to Date Influenza: Up to Date    Relevant past medical, surgical, family and social history reviewed and updated as indicated. Interim medical history since our last visit reviewed. Allergies and medications reviewed and updated.  Review of Systems  Eyes:  Negative for visual disturbance.  Respiratory:  Negative for shortness of breath.   Cardiovascular:  Negative for chest pain and leg swelling.  Skin:        Ear scab  Neurological:  Negative for light-headedness and headaches.    Per HPI unless specifically indicated above     Objective:    BP 138/70   Pulse 64   Temp 97.6 F (36.4 C) (Oral)   Wt 137 lb 9.6 oz (62.4 kg)    SpO2 99%   BMI 22.55 kg/m   Wt Readings from Last 3 Encounters:  07/31/22 137 lb 9.6 oz (62.4 kg)  04/30/22 148 lb 4.8 oz (67.3 kg)  02/27/22 147 lb 1.6 oz (66.7 kg)    Physical Exam Vitals and nursing note reviewed.  Constitutional:      General: He is not in acute distress.    Appearance: Normal appearance. He is not ill-appearing, toxic-appearing or diaphoretic.  HENT:     Head: Normocephalic.      Right Ear: External ear normal.     Left Ear: External ear normal.     Nose: Nose normal. No congestion or rhinorrhea.     Mouth/Throat:     Mouth: Mucous membranes are moist.  Eyes:     General:        Right eye: No discharge.        Left eye: No discharge.     Extraocular Movements: Extraocular movements intact.     Conjunctiva/sclera: Conjunctivae normal.     Pupils: Pupils are equal, round, and reactive to light.  Cardiovascular:     Rate and Rhythm: Normal rate and regular rhythm.     Heart sounds: No murmur heard. Pulmonary:     Effort: Pulmonary effort is normal. No respiratory distress.     Breath sounds: Normal breath sounds. No wheezing, rhonchi or rales.  Abdominal:     General: Abdomen is flat. Bowel sounds are  normal.  Musculoskeletal:     Cervical back: Normal range of motion and neck supple.  Skin:    General: Skin is warm and dry.     Capillary Refill: Capillary refill takes less than 2 seconds.  Neurological:     General: No focal deficit present.     Mental Status: He is alert and oriented to person, place, and time.  Psychiatric:        Mood and Affect: Mood normal.        Behavior: Behavior normal.        Thought Content: Thought content normal.        Judgment: Judgment normal.     Results for orders placed or performed in visit on 04/30/22  Comp Met (CMET)  Result Value Ref Range   Glucose 79 70 - 99 mg/dL   BUN 15 8 - 27 mg/dL   Creatinine, Ser 7.82 (H) 0.76 - 1.27 mg/dL   eGFR 48 (L) >95 AO/ZHY/8.65   BUN/Creatinine Ratio 10 10 - 24    Sodium 142 134 - 144 mmol/L   Potassium 4.4 3.5 - 5.2 mmol/L   Chloride 102 96 - 106 mmol/L   CO2 25 20 - 29 mmol/L   Calcium 9.4 8.6 - 10.2 mg/dL   Total Protein 6.9 6.0 - 8.5 g/dL   Albumin 4.7 3.8 - 4.8 g/dL   Globulin, Total 2.2 1.5 - 4.5 g/dL   Albumin/Globulin Ratio 2.1 1.2 - 2.2   Bilirubin Total 1.0 0.0 - 1.2 mg/dL   Alkaline Phosphatase 76 44 - 121 IU/L   AST 17 0 - 40 IU/L   ALT 11 0 - 44 IU/L  Lipid Profile  Result Value Ref Range   Cholesterol, Total 134 100 - 199 mg/dL   Triglycerides 59 0 - 149 mg/dL   HDL 51 >78 mg/dL   VLDL Cholesterol Cal 13 5 - 40 mg/dL   LDL Chol Calc (NIH) 70 0 - 99 mg/dL   Chol/HDL Ratio 2.6 0.0 - 5.0 ratio  CBC w/Diff  Result Value Ref Range   WBC 5.2 3.4 - 10.8 x10E3/uL   RBC 3.71 (L) 4.14 - 5.80 x10E6/uL   Hemoglobin 12.2 (L) 13.0 - 17.7 g/dL   Hematocrit 46.9 (L) 62.9 - 51.0 %   MCV 98 (H) 79 - 97 fL   MCH 32.9 26.6 - 33.0 pg   MCHC 33.6 31.5 - 35.7 g/dL   RDW 52.8 41.3 - 24.4 %   Platelets 223 150 - 450 x10E3/uL   Neutrophils 62 Not Estab. %   Lymphs 27 Not Estab. %   Monocytes 7 Not Estab. %   Eos 3 Not Estab. %   Basos 1 Not Estab. %   Neutrophils Absolute 3.2 1.4 - 7.0 x10E3/uL   Lymphocytes Absolute 1.4 0.7 - 3.1 x10E3/uL   Monocytes Absolute 0.3 0.1 - 0.9 x10E3/uL   EOS (ABSOLUTE) 0.2 0.0 - 0.4 x10E3/uL   Basophils Absolute 0.1 0.0 - 0.2 x10E3/uL   Immature Granulocytes 0 Not Estab. %   Immature Grans (Abs) 0.0 0.0 - 0.1 x10E3/uL      Assessment & Plan:   Problem List Items Addressed This Visit       Cardiovascular and Mediastinum   Coronary artery disease    Chronic. Found on CT Lung in 2023.  Does not want updated CT.  Continue with Atorvastatin.  Labs ordered today.  Follow up in 4 months for reevaluation.      Relevant Medications  amLODipine (NORVASC) 10 MG tablet   atorvastatin (LIPITOR) 20 MG tablet   Essential (primary) hypertension - Primary    Chronic.  Controlled.  Discussed that patient needs  to take Amlodipine 10mg  vs 5mg .  Refills sent today.  Return to clinic in 4 months for reevaluation.  Call sooner if concerns arise.        Relevant Medications   amLODipine (NORVASC) 10 MG tablet   atorvastatin (LIPITOR) 20 MG tablet   Other Relevant Orders   Comp Met (CMET)   Aortic atherosclerosis (HCC)    Chronic. Found on CT Lung in 2023.  Continue with Atorvastatin.  Labs ordered today.  Follow up in 5 months for reevaluation.  Call sooner if concerns arise.       Relevant Medications   amLODipine (NORVASC) 10 MG tablet   atorvastatin (LIPITOR) 20 MG tablet     Respiratory   Emphysema lung (HCC)    Chronic.  Controlled without medication.  Will add medication if needed based on patient's symptoms.  Labs ordered today.  Return to clinic in 4 months for reevaluation.  Call sooner if concerns arise.          Genitourinary   CKD (chronic kidney disease) stage 3, GFR 30-59 ml/min (HCC)    Labs ordered at visit today.  Will make recommendations based on lab results.        Relevant Orders   CBC w/Diff     Other   Hypertriglyceridemia    Chronic.  Controlled.  Continue with current medication regimen of Atorvastatin daily.  Labs ordered today.  Return to clinic in 4 months for reevaluation.  Call sooner if concerns arise.         Relevant Medications   amLODipine (NORVASC) 10 MG tablet   atorvastatin (LIPITOR) 20 MG tablet   Other Relevant Orders   Lipid Profile     Follow up plan: Return in about 4 months (around 11/30/2022) for HTN, HLD, DM2 FU.

## 2022-07-31 NOTE — Assessment & Plan Note (Signed)
Chronic.  Controlled.  Discussed that patient needs to take Amlodipine 10mg  vs 5mg .  Refills sent today.  Return to clinic in 4 months for reevaluation.  Call sooner if concerns arise.

## 2022-07-31 NOTE — Assessment & Plan Note (Signed)
Chronic. Found on CT Lung in 2023.  Does not want updated CT.  Continue with Atorvastatin.  Labs ordered today.  Follow up in 4 months for reevaluation.

## 2022-07-31 NOTE — Assessment & Plan Note (Signed)
Chronic. Found on CT Lung in 2023.  Continue with Atorvastatin.  Labs ordered today.  Follow up in 5 months for reevaluation.  Call sooner if concerns arise.

## 2022-07-31 NOTE — Assessment & Plan Note (Signed)
Labs ordered at visit today.  Will make recommendations based on lab results.   

## 2022-07-31 NOTE — Assessment & Plan Note (Signed)
Chronic.  Controlled.  Continue with current medication regimen of Atorvastatin daily.  Labs ordered today.  Return to clinic in 4 months for reevaluation.  Call sooner if concerns arise.

## 2022-08-01 LAB — CBC WITH DIFFERENTIAL/PLATELET
Basophils Absolute: 0 10*3/uL (ref 0.0–0.2)
Basos: 1 %
EOS (ABSOLUTE): 0.2 10*3/uL (ref 0.0–0.4)
Eos: 4 %
Hematocrit: 34.8 % — ABNORMAL LOW (ref 37.5–51.0)
Hemoglobin: 12.2 g/dL — ABNORMAL LOW (ref 13.0–17.7)
Immature Grans (Abs): 0 10*3/uL (ref 0.0–0.1)
Immature Granulocytes: 0 %
Lymphocytes Absolute: 1.5 10*3/uL (ref 0.7–3.1)
Lymphs: 31 %
MCH: 32.9 pg (ref 26.6–33.0)
MCHC: 35.1 g/dL (ref 31.5–35.7)
MCV: 94 fL (ref 79–97)
Monocytes Absolute: 0.4 10*3/uL (ref 0.1–0.9)
Monocytes: 9 %
Neutrophils Absolute: 2.7 10*3/uL (ref 1.4–7.0)
Neutrophils: 55 %
Platelets: 199 10*3/uL (ref 150–450)
RBC: 3.71 x10E6/uL — ABNORMAL LOW (ref 4.14–5.80)
RDW: 14 % (ref 11.6–15.4)
WBC: 4.9 10*3/uL (ref 3.4–10.8)

## 2022-08-01 LAB — COMPREHENSIVE METABOLIC PANEL
ALT: 13 IU/L (ref 0–44)
AST: 19 IU/L (ref 0–40)
Albumin/Globulin Ratio: 2.1
Albumin: 4.4 g/dL (ref 3.8–4.8)
Alkaline Phosphatase: 70 IU/L (ref 44–121)
BUN/Creatinine Ratio: 15 (ref 10–24)
BUN: 24 mg/dL (ref 8–27)
Bilirubin Total: 1.1 mg/dL (ref 0.0–1.2)
CO2: 24 mmol/L (ref 20–29)
Calcium: 9.2 mg/dL (ref 8.6–10.2)
Chloride: 103 mmol/L (ref 96–106)
Creatinine, Ser: 1.61 mg/dL — ABNORMAL HIGH (ref 0.76–1.27)
Globulin, Total: 2.1 g/dL (ref 1.5–4.5)
Glucose: 80 mg/dL (ref 70–99)
Potassium: 4.4 mmol/L (ref 3.5–5.2)
Sodium: 140 mmol/L (ref 134–144)
Total Protein: 6.5 g/dL (ref 6.0–8.5)
eGFR: 44 mL/min/{1.73_m2} — ABNORMAL LOW (ref >59)

## 2022-08-01 LAB — LIPID PANEL
Chol/HDL Ratio: 2 ratio (ref 0.0–5.0)
Cholesterol, Total: 87 mg/dL — ABNORMAL LOW (ref 100–199)
HDL: 43 mg/dL (ref >39)
LDL Chol Calc (NIH): 30 mg/dL (ref 0–99)
Triglycerides: 59 mg/dL (ref 0–149)
VLDL Cholesterol Cal: 14 mg/dL (ref 5–40)

## 2022-08-01 NOTE — Progress Notes (Signed)
Please let patient know that his lab work looks good.  No concerns at this time.  Continue with current medication regimen.  Follow up as discussed.

## 2022-08-21 ENCOUNTER — Telehealth: Payer: Self-pay | Admitting: Nurse Practitioner

## 2022-08-21 NOTE — Telephone Encounter (Signed)
Copied from CRM [id number]. Topic: Medicare AWV >> Aug 21, 2022 11:40 AM Payton Doughty wrote: Reason for CRM: LM 08/21/2022 to schedule AWV   Verlee Rossetti; Care Guide Ambulatory Clinical Support [place] [facility] Direct Dial: [phone]

## 2022-12-03 ENCOUNTER — Encounter: Payer: Self-pay | Admitting: Nurse Practitioner

## 2022-12-03 ENCOUNTER — Ambulatory Visit: Payer: 59 | Admitting: Nurse Practitioner

## 2022-12-03 VITALS — BP 110/64 | HR 65 | Temp 98.6°F | Ht 65.5 in | Wt 133.6 lb

## 2022-12-03 DIAGNOSIS — I700 Atherosclerosis of aorta: Secondary | ICD-10-CM | POA: Diagnosis not present

## 2022-12-03 DIAGNOSIS — Z7189 Other specified counseling: Secondary | ICD-10-CM

## 2022-12-03 DIAGNOSIS — N1831 Chronic kidney disease, stage 3a: Secondary | ICD-10-CM

## 2022-12-03 DIAGNOSIS — E781 Pure hyperglyceridemia: Secondary | ICD-10-CM

## 2022-12-03 DIAGNOSIS — N183 Chronic kidney disease, stage 3 unspecified: Secondary | ICD-10-CM | POA: Diagnosis not present

## 2022-12-03 DIAGNOSIS — I2510 Atherosclerotic heart disease of native coronary artery without angina pectoris: Secondary | ICD-10-CM

## 2022-12-03 DIAGNOSIS — J439 Emphysema, unspecified: Secondary | ICD-10-CM

## 2022-12-03 DIAGNOSIS — I10 Essential (primary) hypertension: Secondary | ICD-10-CM

## 2022-12-03 DIAGNOSIS — Z0000 Encounter for general adult medical examination without abnormal findings: Secondary | ICD-10-CM | POA: Diagnosis not present

## 2022-12-03 DIAGNOSIS — Z23 Encounter for immunization: Secondary | ICD-10-CM

## 2022-12-03 DIAGNOSIS — D631 Anemia in chronic kidney disease: Secondary | ICD-10-CM

## 2022-12-03 MED ORDER — ATORVASTATIN CALCIUM 20 MG PO TABS: 20.0000 mg | ORAL_TABLET | Freq: Every day | ORAL | 1 refills | Status: DC

## 2022-12-03 MED ORDER — AMLODIPINE BESYLATE 10 MG PO TABS: 10.0000 mg | ORAL_TABLET | Freq: Every day | ORAL | 1 refills | Status: DC

## 2022-12-03 NOTE — Assessment & Plan Note (Signed)
Labs ordered at visit today.  Will make recommendations based on lab results.   

## 2022-12-03 NOTE — Progress Notes (Signed)
BP 110/64   Pulse 65   Temp 98.6 F (37 C) (Oral)   Ht 5' 5.5" (1.664 m)   Wt 133 lb 9.6 oz (60.6 kg)   SpO2 100%   BMI 21.89 kg/m    Subjective:    Patient ID: Drew Russell, male    DOB: [birth year], 77 y.o.   MRN: [id number]  HPI: JI FELDNER is a 77 y.o. male presenting on 12/03/2022 for comprehensive medical examination. Current medical complaints include:none  He currently lives with: Interim Problems from his last visit: no  HYPERTENSION Hypertension status: controlled  Satisfied with current treatment? no Duration of hypertension: years BP monitoring frequency:  not checking BP range:  BP medication side effects:  no Medication compliance: excellent compliance Previous BP meds:amlodipine Aspirin: no Recurrent headaches: no Visual changes: no Palpitations: no Dyspnea: no Chest pain: no Lower extremity edema: no Dizzy/lightheaded: no   CHRONIC KIDNEY DISEASE CKD status: controlled Medications renally dose: yes Previous renal evaluation: no Pneumovax:  Up to Date Influenza Vaccine:  Not up to Date   COPD COPD status: controlled Satisfied with current treatment?: yes Oxygen use: no Dyspnea frequency: occasional  Cough frequency: none Rescue inhaler frequency:   Limitation of activity: no Productive cough:  Last Spirometry:  Pneumovax: Up to Date Influenza: Up to Date    Functional Status Survey: Is the patient deaf or have difficulty hearing?: Yes Does the patient have difficulty seeing, even when wearing glasses/contacts?: No Does the patient have difficulty concentrating, remembering, or making decisions?: Yes Does the patient have difficulty walking or climbing stairs?: No Does the patient have difficulty dressing or bathing?: No Does the patient have difficulty doing errands alone such as visiting a doctor's office or shopping?: No  FALL RISK:    12/03/2022    2:01 PM 07/31/2022    1:25 PM 04/30/2022    1:57 PM 01/25/2022    1:40 PM  10/26/2021    1:08 PM  Fall Risk   Falls in the past year? 0 0 0 0 0  Number falls in past yr: 0 0 0 0 0  Injury with Fall? 0 0 0 0 0  Risk for fall due to : No Fall Risks No Fall Risks No Fall Risks No Fall Risks No Fall Risks  Follow up Falls evaluation completed Falls evaluation completed Falls evaluation completed Falls evaluation completed Falls evaluation completed    Depression Screen    12/03/2022    2:02 PM 07/31/2022    1:25 PM 04/30/2022    1:57 PM 01/25/2022    1:40 PM 10/26/2021    1:08 PM  Depression screen PHQ 2/9  Decreased Interest 0 0 0 0 0  Down, Depressed, Hopeless 0 0 0 0 0  PHQ - 2 Score 0 0 0 0 0  Altered sleeping 0 0 0 0 0  Tired, decreased energy 0 0 0 0 0  Change in appetite 0 0 0 0 0  Feeling bad or failure about yourself  0 0 0 0 0  Trouble concentrating 0 0 0 0 0  Moving slowly or fidgety/restless 0 0 0 0 0  Suicidal thoughts 0 0 0 0 0  PHQ-9 Score 0 0 0 0 0  Difficult doing work/chores Not difficult at all Not difficult at all Not difficult at all Not difficult at all Not difficult at all    Advanced Directives Does patient have a HCPOA?    no If yes, name and contact information:  Does patient have a living will or MOST form?  no  Past Medical History:  Past Medical History:  Diagnosis Date   History of chicken pox    History of measles as a child    History of mumps as a child    Hyperlipidemia    Hypertension     Surgical History:  Past Surgical History:  Procedure Laterality Date   ANGIOPLASTY  05/21/1993   [facility]   Cardiac Catherterization     per pt had angioplast in 1995, no stent   CATARACT EXTRACTION Bilateral 2007    Medications:  Current Outpatient Medications on File Prior to Visit  Medication Sig   Omega-3 Fatty Acids (FISH OIL) 1000 MG CAPS Take 1,200 mg by mouth daily.   No current facility-administered medications on file prior to visit.    Allergies:  No Known Allergies  Social  History:  Social History   Socioeconomic History   Marital status: Single    Spouse name: Not on file   Number of children: 0   Years of education: 8th grade   Highest education level: 8th grade  Occupational History   Occupation: Retired  Tobacco Use   Smoking status: Former    Current packs/day: 0.00    Average packs/day: 1 pack/day for 50.0 years (50.0 ttl pk-yrs)    Types: Cigarettes    Start date: 01/02/1964    Quit date: 01/01/2014    Years since quitting: 8.9   Smokeless tobacco: Never   Tobacco comments:    smoked 1ppd 50 years. Quit 2015  Vaping Use   Vaping status: Never Used  Substance and Sexual Activity   Alcohol use: No    Alcohol/week: 0.0 standard drinks of alcohol    Comment: Previous heavy drinker   Drug use: No   Sexual activity: Not Currently  Other Topics Concern   Not on file  Social History Narrative   Not on file   Social Determinants of Health   Financial Resource Strain: Low Risk  (08/08/2021)   Overall Financial Resource Strain (CARDIA)    Difficulty of Paying Living Expenses: Not hard at all  Food Insecurity: No Food Insecurity (08/08/2021)   Hunger Vital Sign    Worried About Running Out of Food in the Last Year: Never true    Ran Out of Food in the Last Year: Never true  Transportation Needs: No Transportation Needs (08/08/2021)   PRAPARE - Administrator, Civil Service (Medical): No    Lack of Transportation (Non-Medical): No  Physical Activity: Insufficiently Active (08/08/2021)   Exercise Vital Sign    Days of Exercise per Week: 4 days    Minutes of Exercise per Session: 10 min  Stress: No Stress Concern Present (08/08/2021)   [employer] of Occupational Health - Occupational Stress Questionnaire    Feeling of Stress : Not at all  Social Connections: Unknown (08/08/2021)   Social Connection and Isolation Panel [NHANES]    Frequency of Communication with Friends and Family: Once a week    Frequency of Social  Gatherings with Friends and Family: Never    Attends Religious Services: Never    Database administrator or Organizations: No    Attends Banker Meetings: Never    Marital Status: Not on file  Recent Concern: Social Connections - Socially Isolated (08/08/2021)   Social Connection and Isolation Panel [NHANES]    Frequency of Communication with Friends and Family: Once a week  Frequency of Social Gatherings with Friends and Family: Never    Attends Religious Services: Never    Database administrator or Organizations: No    Attends Banker Meetings: Never    Marital Status: Never married  Intimate Partner Violence: Not At Risk (08/08/2021)   Humiliation, Afraid, Rape, and Kick questionnaire    Fear of Current or Ex-Partner: No    Emotionally Abused: No    Physically Abused: No    Sexually Abused: No   Social History   Tobacco Use  Smoking Status Former   Current packs/day: 0.00   Average packs/day: 1 pack/day for 50.0 years (50.0 ttl pk-yrs)   Types: Cigarettes   Start date: 01/02/1964   Quit date: 01/01/2014   Years since quitting: 8.9  Smokeless Tobacco Never  Tobacco Comments   smoked 1ppd 50 years. Quit 2015   Social History   Substance and Sexual Activity  Alcohol Use No   Alcohol/week: 0.0 standard drinks of alcohol   Comment: Previous heavy drinker    Family History:  Family History  Problem Relation Age of Onset   Stroke Mother    Heart disease Father    Heart attack Father    Colon cancer Sister    Diabetes Sister        type 2   Diabetes Sister        type 2    Past medical history, surgical history, medications, allergies, family history and social history reviewed with patient today and changes made to appropriate areas of the chart.   Review of Systems  Eyes:  Negative for blurred vision and double vision.  Respiratory:  Negative for shortness of breath.   Cardiovascular:  Negative for chest pain, palpitations and leg  swelling.  Neurological:  Negative for dizziness and headaches.   All other ROS negative except what is listed above and in the HPI.      Objective:    BP 110/64   Pulse 65   Temp 98.6 F (37 C) (Oral)   Ht 5' 5.5" (1.664 m)   Wt 133 lb 9.6 oz (60.6 kg)   SpO2 100%   BMI 21.89 kg/m   Wt Readings from Last 3 Encounters:  12/03/22 133 lb 9.6 oz (60.6 kg)  07/31/22 137 lb 9.6 oz (62.4 kg)  04/30/22 148 lb 4.8 oz (67.3 kg)    No results found.  Physical Exam Vitals and nursing note reviewed.  Constitutional:      General: He is not in acute distress.    Appearance: Normal appearance. He is not ill-appearing, toxic-appearing or diaphoretic.  HENT:     Head: Normocephalic.     Right Ear: External ear normal.     Left Ear: External ear normal.     Nose: Nose normal. No congestion or rhinorrhea.     Mouth/Throat:     Mouth: Mucous membranes are moist.  Eyes:     General:        Right eye: No discharge.        Left eye: No discharge.     Extraocular Movements: Extraocular movements intact.     Conjunctiva/sclera: Conjunctivae normal.     Pupils: Pupils are equal, round, and reactive to light.  Cardiovascular:     Rate and Rhythm: Normal rate and regular rhythm.     Heart sounds: No murmur heard. Pulmonary:     Effort: Pulmonary effort is normal. No respiratory distress.     Breath sounds: Normal  breath sounds. No wheezing, rhonchi or rales.  Abdominal:     General: Abdomen is flat. Bowel sounds are normal.  Musculoskeletal:     Cervical back: Normal range of motion and neck supple.  Skin:    General: Skin is warm and dry.     Capillary Refill: Capillary refill takes less than 2 seconds.  Neurological:     General: No focal deficit present.     Mental Status: He is alert and oriented to person, place, and time.  Psychiatric:        Mood and Affect: Mood normal.        Behavior: Behavior normal.        Thought Content: Thought content normal.        Judgment:  Judgment normal.        12/03/2022    2:05 PM 08/08/2021    9:35 AM 04/23/2018    3:05 PM 04/22/2017    3:05 PM 04/18/2016    2:56 PM  6CIT Screen  What Year? 4 points 0 points 0 points 0 points 0 points  What month? 0 points 0 points 0 points 0 points 0 points  What time? 3 points 0 points 3 points 0 points 0 points  Count back from 20 0 points 0 points 0 points 0 points 0 points  Months in reverse 0 points 2 points 0 points 0 points 0 points  Repeat phrase 10 points 6 points 0 points 4 points 6 points  Total Score 17 points 8 points 3 points 4 points 6 points      Results for orders placed or performed in visit on 07/31/22  Comp Met (CMET)  Result Value Ref Range   Glucose 80 70 - 99 mg/dL   BUN 24 8 - 27 mg/dL   Creatinine, Ser 2.95 (H) 0.76 - 1.27 mg/dL   eGFR 44 (L) >28 UX/LKG/4.01   BUN/Creatinine Ratio 15 10 - 24   Sodium 140 134 - 144 mmol/L   Potassium 4.4 3.5 - 5.2 mmol/L   Chloride 103 96 - 106 mmol/L   CO2 24 20 - 29 mmol/L   Calcium 9.2 8.6 - 10.2 mg/dL   Total Protein 6.5 6.0 - 8.5 g/dL   Albumin 4.4 3.8 - 4.8 g/dL   Globulin, Total 2.1 1.5 - 4.5 g/dL   Albumin/Globulin Ratio 2.1    Bilirubin Total 1.1 0.0 - 1.2 mg/dL   Alkaline Phosphatase 70 44 - 121 IU/L   AST 19 0 - 40 IU/L   ALT 13 0 - 44 IU/L  Lipid Profile  Result Value Ref Range   Cholesterol, Total 87 (L) 100 - 199 mg/dL   Triglycerides 59 0 - 149 mg/dL   HDL 43 >02 mg/dL   VLDL Cholesterol Cal 14 5 - 40 mg/dL   LDL Chol Calc (NIH) 30 0 - 99 mg/dL   Chol/HDL Ratio 2.0 0.0 - 5.0 ratio  CBC w/Diff  Result Value Ref Range   WBC 4.9 3.4 - 10.8 x10E3/uL   RBC 3.71 (L) 4.14 - 5.80 x10E6/uL   Hemoglobin 12.2 (L) 13.0 - 17.7 g/dL   Hematocrit 72.5 (L) 36.6 - 51.0 %   MCV 94 79 - 97 fL   MCH 32.9 26.6 - 33.0 pg   MCHC 35.1 31.5 - 35.7 g/dL   RDW 44.0 34.7 - 42.5 %   Platelets 199 150 - 450 x10E3/uL   Neutrophils 55 Not Estab. %   Lymphs 31 Not Estab. %  Monocytes 9 Not Estab. %   Eos 4 Not  Estab. %   Basos 1 Not Estab. %   Neutrophils Absolute 2.7 1.4 - 7.0 x10E3/uL   Lymphocytes Absolute 1.5 0.7 - 3.1 x10E3/uL   Monocytes Absolute 0.4 0.1 - 0.9 x10E3/uL   EOS (ABSOLUTE) 0.2 0.0 - 0.4 x10E3/uL   Basophils Absolute 0.0 0.0 - 0.2 x10E3/uL   Immature Granulocytes 0 Not Estab. %   Immature Grans (Abs) 0.0 0.0 - 0.1 x10E3/uL      Assessment & Plan:   Problem List Items Addressed This Visit       Cardiovascular and Mediastinum   Coronary artery disease    Chronic. Found on CT Lung in 2023.  Does not want updated CT.  Continue with Atorvastatin.  Refills sent today.  Labs ordered today.  Follow up in 3 months for reevaluation.      Relevant Medications   amLODipine (NORVASC) 10 MG tablet   atorvastatin (LIPITOR) 20 MG tablet   Essential (primary) hypertension    Chronic.  Controlled.  Continued with Amlodipine 10mg  daily.  Refills sent today.  Return to clinic in 3 months for reevaluation.  Call sooner if concerns arise.        Relevant Medications   amLODipine (NORVASC) 10 MG tablet   atorvastatin (LIPITOR) 20 MG tablet   Other Relevant Orders   Comp Met (CMET)   Aortic atherosclerosis (HCC)    Chronic. Found on CT Lung in 2023.  Continue with Atorvastatin.  Labs ordered today.  Follow up in 3 months for reevaluation.  Call sooner if concerns arise.       Relevant Medications   amLODipine (NORVASC) 10 MG tablet   atorvastatin (LIPITOR) 20 MG tablet     Respiratory   Emphysema lung (HCC)    Chronic.  Controlled without medication.  Will add medication if needed based on patient's symptoms.  Labs ordered today.  Return to clinic in 3 months for reevaluation.  Call sooner if concerns arise.          Genitourinary   CKD (chronic kidney disease) stage 3, GFR 30-59 ml/min (HCC)    Labs ordered at visit today.  Will make recommendations based on lab results.        Relevant Orders   CBC w/Diff   Anemia in chronic kidney disease (CKD)    Stable.  Labs  ordered at visit today.  Will make recommendations based on lab results.  Patient does not want to see specialist.          Other   Hypertriglyceridemia    Chronic.  Controlled.  Continue with current medication regimen of Atorvastatin daily.  Labs ordered today.  Return to clinic in 3 months for reevaluation.  Call sooner if concerns arise.         Relevant Medications   amLODipine (NORVASC) 10 MG tablet   atorvastatin (LIPITOR) 20 MG tablet   Other Relevant Orders   Lipid Profile   Advanced care planning/counseling discussion    A voluntary discussion about advance care planning including the explanation and discussion of advance directives was extensively discussed  with the patient for 5 minutes with patient and his brother present.  Explanation about the health care proxy and Living will was reviewed and packet with forms with explanation of how to fill them out was given.  During this discussion, the patient was able to identify a health care proxy as Medardo Hassing and plans to fill out the  paperwork required.  Patient was offered a separate Advance Care Planning visit for further assistance with forms.         Other Visit Diagnoses     Encounter for Medicare annual wellness exam    -  Primary   Need for influenza vaccination       Relevant Orders   Flu Vaccine Trivalent High Dose (Fluad) (Completed)        Preventative Services:  Health Risk Assessment and Personalized Prevention Plan: Up to date Bone Mass Measurements: NA CVD Screening: Up to date Colon Cancer Screening: Up to date Depression Screening: Up to date Diabetes Screening: Up to date Glaucoma Screening: Up to date Hepatitis B vaccine: NA Hepatitis C screening: Up to date HIV Screening: Up to date Flu Vaccine: Given today Lung cancer Screening: Refused Obesity Screening: Up to date Pneumonia Vaccines (2): Up to date STI Screening: NA PSA screening: Up to date  Discussed aspirin prophylaxis for  myocardial infarction prevention and decision was it was not indicated  LABORATORY TESTING:  Health maintenance labs ordered today as discussed above.   The natural history of prostate cancer and ongoing controversy regarding screening and potential treatment outcomes of prostate cancer has been discussed with the patient. The meaning of a false positive PSA and a false negative PSA has been discussed. He indicates understanding of the limitations of this screening test and wishes to proceed with screening PSA testing.   IMMUNIZATIONS:   - Tdap: Tetanus vaccination status reviewed: Medicare. - Influenza: Administered today - Pneumovax: Not applicable - Prevnar: Not applicable - Zostavax vaccine: Not applicable  SCREENING: - Colonoscopy: Not applicable  Discussed with patient purpose of the colonoscopy is to detect colon cancer at curable precancerous or early stages   - AAA Screening: Not applicable  -Hearing Test: Not applicable  -Spirometry: Not applicable   PATIENT COUNSELING:    Sexuality: Discussed sexually transmitted diseases, partner selection, use of condoms, avoidance of unintended pregnancy  and contraceptive alternatives.   Advised to avoid cigarette smoking.  I discussed with the patient that most people either abstain from alcohol or drink within safe limits (<=14/week and <=4 drinks/occasion for males, <=7/weeks and <= 3 drinks/occasion for females) and that the risk for alcohol disorders and other health effects rises proportionally with the number of drinks per week and how often a drinker exceeds daily limits.  Discussed cessation/primary prevention of drug use and availability of treatment for abuse.   Diet: Encouraged to adjust caloric intake to maintain  or achieve ideal body weight, to reduce intake of dietary saturated fat and total fat, to limit sodium intake by avoiding high sodium foods and not adding table salt, and to maintain adequate dietary potassium  and calcium preferably from fresh fruits, vegetables, and low-fat dairy products.    stressed the importance of regular exercise  Injury prevention: Discussed safety belts, safety helmets, smoke detector, smoking near bedding or upholstery.   Dental health: Discussed importance of regular tooth brushing, flossing, and dental visits.   Follow up plan: NEXT PREVENTATIVE PHYSICAL DUE IN 1 YEAR. Return in about 3 months (around 03/05/2023) for HTN, HLD, DM2 FU.

## 2022-12-03 NOTE — Assessment & Plan Note (Signed)
A voluntary discussion about advance care planning including the explanation and discussion of advance directives was extensively discussed  with the patient for 5 minutes with patient and his brother present.  Explanation about the health care proxy and Living will was reviewed and packet with forms with explanation of how to fill them out was given.  During this discussion, the patient was able to identify a health care proxy as Seanmichael Salmons and plans to fill out the paperwork required.  Patient was offered a separate Advance Care Planning visit for further assistance with forms.

## 2022-12-03 NOTE — Assessment & Plan Note (Signed)
Chronic. Found on CT Lung in 2023.  Does not want updated CT.  Continue with Atorvastatin.  Refills sent today.  Labs ordered today.  Follow up in 3 months for reevaluation.

## 2022-12-03 NOTE — Assessment & Plan Note (Signed)
Chronic.  Controlled.  Continued with Amlodipine 10mg  daily.  Refills sent today.  Return to clinic in 3 months for reevaluation.  Call sooner if concerns arise.

## 2022-12-03 NOTE — Assessment & Plan Note (Signed)
Chronic.  Controlled without medication.  Will add medication if needed based on patient's symptoms.  Labs ordered today.  Return to clinic in 3 months for reevaluation.  Call sooner if concerns arise.

## 2022-12-03 NOTE — Assessment & Plan Note (Signed)
Chronic.  Controlled.  Continue with current medication regimen of Atorvastatin daily.  Labs ordered today.  Return to clinic in 3 months for reevaluation.  Call sooner if concerns arise.

## 2022-12-03 NOTE — Assessment & Plan Note (Signed)
Stable.  Labs ordered at visit today.  Will make recommendations based on lab results.  Patient does not want to see specialist.

## 2022-12-03 NOTE — Assessment & Plan Note (Signed)
Chronic. Found on CT Lung in 2023.  Continue with Atorvastatin.  Labs ordered today.  Follow up in 3 months for reevaluation.  Call sooner if concerns arise.

## 2022-12-04 LAB — CBC WITH DIFFERENTIAL/PLATELET
Basophils Absolute: 0 10*3/uL (ref 0.0–0.2)
Basos: 1 %
EOS (ABSOLUTE): 0.1 10*3/uL (ref 0.0–0.4)
Eos: 3 %
Hematocrit: 32.5 % — ABNORMAL LOW (ref 37.5–51.0)
Hemoglobin: 10.9 g/dL — ABNORMAL LOW (ref 13.0–17.7)
Immature Grans (Abs): 0 10*3/uL (ref 0.0–0.1)
Immature Granulocytes: 0 %
Lymphocytes Absolute: 1.5 10*3/uL (ref 0.7–3.1)
Lymphs: 32 %
MCH: 33 pg (ref 26.6–33.0)
MCHC: 33.5 g/dL (ref 31.5–35.7)
MCV: 99 fL — ABNORMAL HIGH (ref 79–97)
Monocytes Absolute: 0.4 10*3/uL (ref 0.1–0.9)
Monocytes: 8 %
Neutrophils Absolute: 2.6 10*3/uL (ref 1.4–7.0)
Neutrophils: 56 %
Platelets: 185 10*3/uL (ref 150–450)
RBC: 3.3 x10E6/uL — ABNORMAL LOW (ref 4.14–5.80)
RDW: 14.4 % (ref 11.6–15.4)
WBC: 4.7 10*3/uL (ref 3.4–10.8)

## 2022-12-04 LAB — COMPREHENSIVE METABOLIC PANEL
ALT: 12 [IU]/L (ref 0–44)
AST: 20 [IU]/L (ref 0–40)
Albumin: 4 g/dL (ref 3.8–4.8)
Alkaline Phosphatase: 67 [IU]/L (ref 44–121)
BUN/Creatinine Ratio: 12 (ref 10–24)
BUN: 18 mg/dL (ref 8–27)
Bilirubin Total: 0.8 mg/dL (ref 0.0–1.2)
CO2: 24 mmol/L (ref 20–29)
Calcium: 8.9 mg/dL (ref 8.6–10.2)
Chloride: 102 mmol/L (ref 96–106)
Creatinine, Ser: 1.47 mg/dL — ABNORMAL HIGH (ref 0.76–1.27)
Globulin, Total: 2.2 g/dL (ref 1.5–4.5)
Glucose: 80 mg/dL (ref 70–99)
Potassium: 4.3 mmol/L (ref 3.5–5.2)
Sodium: 139 mmol/L (ref 134–144)
Total Protein: 6.2 g/dL (ref 6.0–8.5)
eGFR: 48 mL/min/{1.73_m2} — ABNORMAL LOW (ref >59)

## 2022-12-04 LAB — LIPID PANEL
Chol/HDL Ratio: 1.7 {ratio} (ref 0.0–5.0)
Cholesterol, Total: 92 mg/dL — ABNORMAL LOW (ref 100–199)
HDL: 54 mg/dL (ref >39)
LDL Chol Calc (NIH): 26 mg/dL (ref 0–99)
Triglycerides: 45 mg/dL (ref 0–149)
VLDL Cholesterol Cal: 12 mg/dL (ref 5–40)

## 2022-12-04 NOTE — Progress Notes (Signed)
Please let patient know that his lab work shows that his anemia is worse than prior.  I recommend he see a hematologist. I also recommend he start ferrous sulfate 325mg  once daily.  Kidney function and other labs remain stable.  Continue with other medications.  Follow up as discussed.

## 2023-03-04 ENCOUNTER — Emergency Department: Payer: 59

## 2023-03-04 ENCOUNTER — Inpatient Hospital Stay
Admission: EM | Admit: 2023-03-04 | Discharge: 2023-03-21 | DRG: 557 | Disposition: A | Discharge status: Hospice | Payer: 59 | Attending: Internal Medicine | Admitting: Internal Medicine

## 2023-03-04 DIAGNOSIS — N1831 Chronic kidney disease, stage 3a: Secondary | ICD-10-CM | POA: Diagnosis not present

## 2023-03-04 DIAGNOSIS — Z79899 Other long term (current) drug therapy: Secondary | ICD-10-CM

## 2023-03-04 DIAGNOSIS — D61818 Other pancytopenia: Secondary | ICD-10-CM | POA: Insufficient documentation

## 2023-03-04 DIAGNOSIS — F79 Unspecified intellectual disabilities: Secondary | ICD-10-CM | POA: Diagnosis present

## 2023-03-04 DIAGNOSIS — E861 Hypovolemia: Secondary | ICD-10-CM | POA: Diagnosis not present

## 2023-03-04 DIAGNOSIS — Z1152 Encounter for screening for COVID-19: Secondary | ICD-10-CM | POA: Diagnosis not present

## 2023-03-04 DIAGNOSIS — T796XXA Traumatic ischemia of muscle, initial encounter: Secondary | ICD-10-CM

## 2023-03-04 DIAGNOSIS — E860 Dehydration: Secondary | ICD-10-CM | POA: Diagnosis not present

## 2023-03-04 DIAGNOSIS — Z751 Person awaiting admission to adequate facility elsewhere: Secondary | ICD-10-CM

## 2023-03-04 DIAGNOSIS — H66002 Acute suppurative otitis media without spontaneous rupture of ear drum, left ear: Secondary | ICD-10-CM | POA: Diagnosis not present

## 2023-03-04 DIAGNOSIS — Z833 Family history of diabetes mellitus: Secondary | ICD-10-CM | POA: Diagnosis not present

## 2023-03-04 DIAGNOSIS — R630 Anorexia: Secondary | ICD-10-CM

## 2023-03-04 DIAGNOSIS — Z87891 Personal history of nicotine dependence: Secondary | ICD-10-CM | POA: Diagnosis not present

## 2023-03-04 DIAGNOSIS — Y92009 Unspecified place in unspecified non-institutional (private) residence as the place of occurrence of the external cause: Secondary | ICD-10-CM

## 2023-03-04 DIAGNOSIS — E870 Hyperosmolality and hypernatremia: Secondary | ICD-10-CM | POA: Diagnosis present

## 2023-03-04 DIAGNOSIS — Z515 Encounter for palliative care: Secondary | ICD-10-CM | POA: Diagnosis not present

## 2023-03-04 DIAGNOSIS — R748 Abnormal levels of other serum enzymes: Secondary | ICD-10-CM | POA: Diagnosis present

## 2023-03-04 DIAGNOSIS — E43 Unspecified severe protein-calorie malnutrition: Secondary | ICD-10-CM | POA: Diagnosis not present

## 2023-03-04 DIAGNOSIS — I129 Hypertensive chronic kidney disease with stage 1 through stage 4 chronic kidney disease, or unspecified chronic kidney disease: Secondary | ICD-10-CM | POA: Diagnosis present

## 2023-03-04 DIAGNOSIS — F0390 Unspecified dementia without behavioral disturbance: Secondary | ICD-10-CM | POA: Diagnosis present

## 2023-03-04 DIAGNOSIS — D6959 Other secondary thrombocytopenia: Secondary | ICD-10-CM | POA: Diagnosis present

## 2023-03-04 DIAGNOSIS — Z9841 Cataract extraction status, right eye: Secondary | ICD-10-CM

## 2023-03-04 DIAGNOSIS — Z7189 Other specified counseling: Secondary | ICD-10-CM

## 2023-03-04 DIAGNOSIS — Z743 Need for continuous supervision: Secondary | ICD-10-CM | POA: Diagnosis not present

## 2023-03-04 DIAGNOSIS — R599 Enlarged lymph nodes, unspecified: Secondary | ICD-10-CM | POA: Diagnosis present

## 2023-03-04 DIAGNOSIS — U071 COVID-19: Secondary | ICD-10-CM

## 2023-03-04 DIAGNOSIS — I959 Hypotension, unspecified: Secondary | ICD-10-CM

## 2023-03-04 DIAGNOSIS — I9589 Other hypotension: Secondary | ICD-10-CM | POA: Diagnosis present

## 2023-03-04 DIAGNOSIS — G9340 Encephalopathy, unspecified: Secondary | ICD-10-CM | POA: Diagnosis not present

## 2023-03-04 DIAGNOSIS — W19XXXA Unspecified fall, initial encounter: Principal | ICD-10-CM | POA: Diagnosis present

## 2023-03-04 DIAGNOSIS — E785 Hyperlipidemia, unspecified: Secondary | ICD-10-CM | POA: Diagnosis not present

## 2023-03-04 DIAGNOSIS — I6523 Occlusion and stenosis of bilateral carotid arteries: Secondary | ICD-10-CM | POA: Diagnosis not present

## 2023-03-04 DIAGNOSIS — M6282 Rhabdomyolysis: Principal | ICD-10-CM | POA: Diagnosis present

## 2023-03-04 DIAGNOSIS — N179 Acute kidney failure, unspecified: Secondary | ICD-10-CM | POA: Diagnosis present

## 2023-03-04 DIAGNOSIS — Z8619 Personal history of other infectious and parasitic diseases: Secondary | ICD-10-CM

## 2023-03-04 DIAGNOSIS — D631 Anemia in chronic kidney disease: Secondary | ICD-10-CM | POA: Diagnosis not present

## 2023-03-04 DIAGNOSIS — Z9861 Coronary angioplasty status: Secondary | ICD-10-CM

## 2023-03-04 DIAGNOSIS — H6122 Impacted cerumen, left ear: Secondary | ICD-10-CM | POA: Diagnosis present

## 2023-03-04 DIAGNOSIS — I2510 Atherosclerotic heart disease of native coronary artery without angina pectoris: Secondary | ICD-10-CM | POA: Diagnosis present

## 2023-03-04 DIAGNOSIS — G9341 Metabolic encephalopathy: Secondary | ICD-10-CM

## 2023-03-04 DIAGNOSIS — E876 Hypokalemia: Secondary | ICD-10-CM | POA: Diagnosis not present

## 2023-03-04 DIAGNOSIS — E871 Hypo-osmolality and hyponatremia: Secondary | ICD-10-CM | POA: Diagnosis not present

## 2023-03-04 DIAGNOSIS — Z823 Family history of stroke: Secondary | ICD-10-CM

## 2023-03-04 DIAGNOSIS — Z6821 Body mass index (BMI) 21.0-21.9, adult: Secondary | ICD-10-CM

## 2023-03-04 DIAGNOSIS — S0990XA Unspecified injury of head, initial encounter: Secondary | ICD-10-CM | POA: Diagnosis not present

## 2023-03-04 DIAGNOSIS — Z9842 Cataract extraction status, left eye: Secondary | ICD-10-CM

## 2023-03-04 DIAGNOSIS — Z66 Do not resuscitate: Secondary | ICD-10-CM | POA: Diagnosis not present

## 2023-03-04 DIAGNOSIS — Z800 Family history of malignant neoplasm of digestive organs: Secondary | ICD-10-CM

## 2023-03-04 DIAGNOSIS — Z8249 Family history of ischemic heart disease and other diseases of the circulatory system: Secondary | ICD-10-CM

## 2023-03-04 DIAGNOSIS — R0902 Hypoxemia: Secondary | ICD-10-CM | POA: Diagnosis not present

## 2023-03-04 DIAGNOSIS — I10 Essential (primary) hypertension: Secondary | ICD-10-CM | POA: Diagnosis not present

## 2023-03-04 DIAGNOSIS — R279 Unspecified lack of coordination: Secondary | ICD-10-CM | POA: Diagnosis not present

## 2023-03-04 LAB — COMPREHENSIVE METABOLIC PANEL
ALT: 23 U/L (ref 0–44)
AST: 61 U/L — ABNORMAL HIGH (ref 15–41)
Albumin: 3.8 g/dL (ref 3.5–5.0)
Alkaline Phosphatase: 52 U/L (ref 38–126)
Anion gap: 12 (ref 5–15)
BUN: 34 mg/dL — ABNORMAL HIGH (ref 8–23)
CO2: 24 mmol/L (ref 22–32)
Calcium: 8.7 mg/dL — ABNORMAL LOW (ref 8.9–10.3)
Chloride: 101 mmol/L (ref 98–111)
Creatinine, Ser: 1.45 mg/dL — ABNORMAL HIGH (ref 0.61–1.24)
GFR, Estimated: 49 mL/min — ABNORMAL LOW (ref >60)
Glucose, Bld: 93 mg/dL (ref 70–99)
Potassium: 4.3 mmol/L (ref 3.5–5.1)
Sodium: 137 mmol/L (ref 135–145)
Total Bilirubin: 1.6 mg/dL — ABNORMAL HIGH (ref 0.0–1.2)
Total Protein: 6.5 g/dL (ref 6.5–8.1)

## 2023-03-04 LAB — CBC WITH DIFFERENTIAL/PLATELET
Abs Immature Granulocytes: 0.01 10*3/uL (ref 0.00–0.07)
Basophils Absolute: 0 10*3/uL (ref 0.0–0.1)
Basophils Relative: 0 %
Eosinophils Absolute: 0 10*3/uL (ref 0.0–0.5)
Eosinophils Relative: 0 %
HCT: 36.3 % — ABNORMAL LOW (ref 39.0–52.0)
Hemoglobin: 12.3 g/dL — ABNORMAL LOW (ref 13.0–17.0)
Immature Granulocytes: 0 %
Lymphocytes Relative: 11 %
Lymphs Abs: 0.5 10*3/uL — ABNORMAL LOW (ref 0.7–4.0)
MCH: 32.9 pg (ref 26.0–34.0)
MCHC: 33.9 g/dL (ref 30.0–36.0)
MCV: 97.1 fL (ref 80.0–100.0)
Monocytes Absolute: 0.6 10*3/uL (ref 0.1–1.0)
Monocytes Relative: 12 %
Neutro Abs: 3.5 10*3/uL (ref 1.7–7.7)
Neutrophils Relative %: 77 %
Platelets: 143 10*3/uL — ABNORMAL LOW (ref 150–400)
RBC: 3.74 MIL/uL — ABNORMAL LOW (ref 4.22–5.81)
RDW: 14.6 % (ref 11.5–15.5)
WBC: 4.6 10*3/uL (ref 4.0–10.5)
nRBC: 0 % (ref 0.0–0.2)

## 2023-03-04 LAB — TROPONIN I (HIGH SENSITIVITY)
Troponin I (High Sensitivity): 28 ng/L — ABNORMAL HIGH (ref <18)
Troponin I (High Sensitivity): 36 ng/L — ABNORMAL HIGH (ref <18)

## 2023-03-04 LAB — CK: Total CK: 1041 U/L — ABNORMAL HIGH (ref 49–397)

## 2023-03-04 MED ORDER — SODIUM CHLORIDE 0.9 % IV BOLUS
1000.0000 mL | Freq: Once | INTRAVENOUS | Status: AC
  Administered 2023-03-05: 1000 mL via INTRAVENOUS

## 2023-03-04 MED ORDER — SODIUM CHLORIDE 0.9 % IV BOLUS
500.0000 mL | Freq: Once | INTRAVENOUS | Status: AC
  Administered 2023-03-04: 500 mL via INTRAVENOUS

## 2023-03-04 NOTE — ED Notes (Signed)
 Pt ambulatory to stat desk, pt confused, dried blood in left ear.

## 2023-03-04 NOTE — ED Provider Notes (Signed)
 [facility] Provider Note    Event Date/Time   First MD Initiated Contact with Patient 03/04/23 2302     (approximate)   History   Altered Mental Status   HPI  Drew Russell is a 78 y.o. male   Past medical history of hypertension and hyperlipidemia, CAD, CKD presents to the emergency department with being found down on the ground.  Reportedly fell at home yesterday and his brother was caring for him feeding him on the floor waiting for him to get enough energy to get up.  Reportedly history of dementia.  He is confused and offers no complaints.  When I speak with patient he was dehydrated with cracked lips and dry mucous membranes.  He offers no acute medical complaints and says he feels well.    He specifically denies chest pain, head pain, neck pain, abdominal pain or pain anywhere.  He denies any respiratory complaints, GI or GU complaints.  He is disoriented does not know where he is or what is going on.   External Medical Documents Reviewed: Progress notes from a outpatient visit in October 2024 with past medical history and medications.     Physical Exam   Triage Vital Signs: ED Triage Vitals  Encounter Vitals Group     BP 03/04/23 1541 103/88     Systolic BP Percentile --      Diastolic BP Percentile --      Pulse Rate 03/04/23 1541 83     Resp 03/04/23 1541 20     Temp 03/04/23 1541 98.5 F (36.9 C)     Temp Source 03/04/23 2154 Axillary     SpO2 03/04/23 1541 97 %     Weight --      Height --      Head Circumference --      Peak Flow --      Pain Score 03/04/23 1550 0     Pain Loc --      Pain Education --      Exclude from Growth Chart --     Most recent vital signs: Vitals:   03/04/23 2315 03/04/23 2330  BP: 128/71 (!) 115/47  Pulse: 72 65  Resp: 17 15  Temp:    SpO2: 100% 100%    General: Awake, no distress.  CV:  Good peripheral perfusion. Resp:  Normal effort.  Abd:  No distention.  Other:  He has a skin  lesion to his chin, as well as a dried blood in his left external ear, the internal exam is occluded by cerumen.  Has not soft supple neck with full range of motion, nontender with no step-off or deformity and no other signs of head trauma except for the ear blood.  He is able to move all extremities.  He has no tenderness to palpation to the bony prominences of the extremities nor on his thorax, back, or abdomen.   ED Results / Procedures / Treatments   Labs (all labs ordered are listed, but only abnormal results are displayed) Labs Reviewed  CBC WITH DIFFERENTIAL/PLATELET - Abnormal; Notable for the following components:      Result Value   RBC 3.74 (*)    Hemoglobin 12.3 (*)    HCT 36.3 (*)    Platelets 143 (*)    Lymphs Abs 0.5 (*)    All other components within normal limits  COMPREHENSIVE METABOLIC PANEL - Abnormal; Notable for the following components:   BUN 34 (*)  Creatinine, Ser 1.45 (*)    Calcium  8.7 (*)    AST 61 (*)    Total Bilirubin 1.6 (*)    GFR, Estimated 49 (*)    All other components within normal limits  CK - Abnormal; Notable for the following components:   Total CK 1,041 (*)    All other components within normal limits  TROPONIN I (HIGH SENSITIVITY) - Abnormal; Notable for the following components:   Troponin I (High Sensitivity) 28 (*)    All other components within normal limits  TROPONIN I (HIGH SENSITIVITY) - Abnormal; Notable for the following components:   Troponin I (High Sensitivity) 36 (*)    All other components within normal limits  CULTURE, BLOOD (ROUTINE X 2)  CULTURE, BLOOD (ROUTINE X 2)  RESP PANEL BY RT-PCR (RSV, FLU A&B, COVID)  [id number]  URINALYSIS, ROUTINE W REFLEX MICROSCOPIC  LACTIC ACID, PLASMA  LACTIC ACID, PLASMA     I ordered and reviewed the above labs they are notable for is a mild troponin leak in the 20s to 30s and rhabdomyolysis over 1000 CK.  EKG  ED ECG REPORT I, Ginnie Shams, the attending physician, personally  viewed and interpreted this ECG.   Date: 03/04/2023  EKG Time: 1557  Rate: 83  Rhythm: sinus  Axis: nl  Intervals:none  ST&T Change: no stemi    RADIOLOGY I independently reviewed and interpreted CT scan of the head see no obvious bleeding or midline shift. I also reviewed radiologist's formal read.   PROCEDURES:  Critical Care performed: Yes, see critical care procedure note(s)  .Critical Care  Performed by: Shams Ginnie, MD Authorized by: Shams Ginnie, MD   Critical care provider statement:    Critical care time (minutes):  30   Critical care was time spent personally by me on the following activities:  Development of treatment plan with patient or surrogate, discussions with consultants, evaluation of patient's response to treatment, examination of patient, ordering and review of laboratory studies, ordering and review of radiographic studies, ordering and performing treatments and interventions, pulse oximetry, re-evaluation of patient's condition and review of old charts    MEDICATIONS ORDERED IN ED: Medications  sodium chloride  0.9 % bolus 1,000 mL (has no administration in time range)  ceFEPIme  (MAXIPIME ) 2 g in sodium chloride  0.9 % 100 mL IVPB (has no administration in time range)  0.9 %  sodium chloride  infusion (has no administration in time range)  sodium chloride  0.9 % bolus 500 mL (500 mLs Intravenous New Bag/Given 03/04/23 2244)    External physician / consultants:  I spoke with hospital medicine for admission and hospital medicine doctor for admission athospital medicine for admit and  regarding care plan for this patient.   IMPRESSION / MDM / ASSESSMENT AND PLAN / ED COURSE  I reviewed the triage vital signs and the nursing notes.                                Patient's presentation is most consistent with acute presentation with potential threat to life or bodily function.  Differential diagnosis includes, but is not limited to, infection, metabolic  derangement, metabolic encephalopathy, trauma, rhabdo   The patient is on the cardiac monitor to evaluate for evidence of arrhythmia and/or significant heart rate changes.  MDM: Patient was found down by family members unknown downtime with evidence of rhabdomyolysis and dehydration.  Was also transiently hypotensive responded to minimal fluids.  No localizing infectious symptoms but patient is disoriented and unable to provide adequate history.  Will start on broad-spectrum antibiotics and get infectious workup.  He does have evidence of rhabdomyolysis and so was given fluids.  No focal neurologic deficits to suggest stroke.  No other trauma noted on full physical exam, and head scan appears without acute abnormalities.  Patient to be admitted for rhabdomyolysis ongoing infx w/u.       FINAL CLINICAL IMPRESSION(S) / ED DIAGNOSES   Final diagnoses:  Fall, initial encounter  Traumatic rhabdomyolysis, initial encounter [facility])     Rx / DC Orders   ED Discharge Orders     None        Note:  This document was prepared using Dragon voice recognition software and may include unintentional dictation errors.    Cyrena Mylar, MD 03/05/23 [phone]

## 2023-03-04 NOTE — ED Triage Notes (Signed)
 Patient to ED via EMS; patient is unsure why he is here but EMS reported a possible fall. Called sister who is listed in chart as emergency contact and left voicemail.

## 2023-03-04 NOTE — ED Triage Notes (Signed)
 First nurse note: pt to ED [facility] from home for fall yesterday. Brother was feeding pt on the floor waiting for pt to get energized to get up. Hx dementia

## 2023-03-05 ENCOUNTER — Ambulatory Visit: Payer: 59 | Admitting: Nurse Practitioner

## 2023-03-05 DIAGNOSIS — D6959 Other secondary thrombocytopenia: Secondary | ICD-10-CM | POA: Diagnosis present

## 2023-03-05 DIAGNOSIS — F0390 Unspecified dementia without behavioral disturbance: Secondary | ICD-10-CM | POA: Diagnosis present

## 2023-03-05 DIAGNOSIS — E861 Hypovolemia: Secondary | ICD-10-CM | POA: Diagnosis present

## 2023-03-05 DIAGNOSIS — Z833 Family history of diabetes mellitus: Secondary | ICD-10-CM | POA: Diagnosis not present

## 2023-03-05 DIAGNOSIS — G9341 Metabolic encephalopathy: Secondary | ICD-10-CM

## 2023-03-05 DIAGNOSIS — G9340 Encephalopathy, unspecified: Secondary | ICD-10-CM | POA: Diagnosis not present

## 2023-03-05 DIAGNOSIS — I959 Hypotension, unspecified: Secondary | ICD-10-CM

## 2023-03-05 DIAGNOSIS — Z87891 Personal history of nicotine dependence: Secondary | ICD-10-CM | POA: Diagnosis not present

## 2023-03-05 DIAGNOSIS — E785 Hyperlipidemia, unspecified: Secondary | ICD-10-CM | POA: Diagnosis present

## 2023-03-05 DIAGNOSIS — I2510 Atherosclerotic heart disease of native coronary artery without angina pectoris: Secondary | ICD-10-CM | POA: Diagnosis present

## 2023-03-05 DIAGNOSIS — Z7189 Other specified counseling: Secondary | ICD-10-CM | POA: Diagnosis not present

## 2023-03-05 DIAGNOSIS — W19XXXA Unspecified fall, initial encounter: Secondary | ICD-10-CM | POA: Diagnosis present

## 2023-03-05 DIAGNOSIS — U071 COVID-19: Secondary | ICD-10-CM | POA: Diagnosis present

## 2023-03-05 DIAGNOSIS — E860 Dehydration: Secondary | ICD-10-CM | POA: Diagnosis present

## 2023-03-05 DIAGNOSIS — E876 Hypokalemia: Secondary | ICD-10-CM | POA: Diagnosis not present

## 2023-03-05 DIAGNOSIS — D61818 Other pancytopenia: Secondary | ICD-10-CM | POA: Diagnosis not present

## 2023-03-05 DIAGNOSIS — I129 Hypertensive chronic kidney disease with stage 1 through stage 4 chronic kidney disease, or unspecified chronic kidney disease: Secondary | ICD-10-CM | POA: Diagnosis present

## 2023-03-05 DIAGNOSIS — E871 Hypo-osmolality and hyponatremia: Secondary | ICD-10-CM | POA: Diagnosis present

## 2023-03-05 DIAGNOSIS — N1831 Chronic kidney disease, stage 3a: Secondary | ICD-10-CM | POA: Diagnosis present

## 2023-03-05 DIAGNOSIS — E43 Unspecified severe protein-calorie malnutrition: Secondary | ICD-10-CM | POA: Diagnosis present

## 2023-03-05 DIAGNOSIS — E870 Hyperosmolality and hypernatremia: Secondary | ICD-10-CM | POA: Diagnosis present

## 2023-03-05 DIAGNOSIS — H66002 Acute suppurative otitis media without spontaneous rupture of ear drum, left ear: Secondary | ICD-10-CM | POA: Diagnosis not present

## 2023-03-05 DIAGNOSIS — Z1152 Encounter for screening for COVID-19: Secondary | ICD-10-CM | POA: Diagnosis not present

## 2023-03-05 DIAGNOSIS — D631 Anemia in chronic kidney disease: Secondary | ICD-10-CM | POA: Diagnosis present

## 2023-03-05 DIAGNOSIS — Z515 Encounter for palliative care: Secondary | ICD-10-CM | POA: Diagnosis not present

## 2023-03-05 DIAGNOSIS — Z66 Do not resuscitate: Secondary | ICD-10-CM | POA: Diagnosis present

## 2023-03-05 DIAGNOSIS — Z8249 Family history of ischemic heart disease and other diseases of the circulatory system: Secondary | ICD-10-CM | POA: Diagnosis not present

## 2023-03-05 DIAGNOSIS — R630 Anorexia: Secondary | ICD-10-CM | POA: Diagnosis not present

## 2023-03-05 DIAGNOSIS — M6282 Rhabdomyolysis: Secondary | ICD-10-CM | POA: Diagnosis present

## 2023-03-05 DIAGNOSIS — Y92009 Unspecified place in unspecified non-institutional (private) residence as the place of occurrence of the external cause: Secondary | ICD-10-CM | POA: Diagnosis not present

## 2023-03-05 DIAGNOSIS — N179 Acute kidney failure, unspecified: Secondary | ICD-10-CM | POA: Diagnosis present

## 2023-03-05 LAB — BASIC METABOLIC PANEL
Anion gap: 9 (ref 5–15)
BUN: 41 mg/dL — ABNORMAL HIGH (ref 8–23)
CO2: 21 mmol/L — ABNORMAL LOW (ref 22–32)
Calcium: 7.6 mg/dL — ABNORMAL LOW (ref 8.9–10.3)
Chloride: 107 mmol/L (ref 98–111)
Creatinine, Ser: 1.75 mg/dL — ABNORMAL HIGH (ref 0.61–1.24)
GFR, Estimated: 39 mL/min — ABNORMAL LOW (ref >60)
Glucose, Bld: 96 mg/dL (ref 70–99)
Potassium: 4.1 mmol/L (ref 3.5–5.1)
Sodium: 137 mmol/L (ref 135–145)

## 2023-03-05 LAB — URINALYSIS, ROUTINE W REFLEX MICROSCOPIC
Bacteria, UA: NONE SEEN
Bilirubin Urine: NEGATIVE
Glucose, UA: NEGATIVE mg/dL
Ketones, ur: 5 mg/dL — AB
Leukocytes,Ua: NEGATIVE
Nitrite: NEGATIVE
Protein, ur: NEGATIVE mg/dL
Specific Gravity, Urine: 1.016 (ref 1.005–1.030)
Squamous Epithelial / HPF: 0 /[HPF] (ref 0–5)
pH: 5 (ref 5.0–8.0)

## 2023-03-05 LAB — RESP PANEL BY RT-PCR (RSV, FLU A&B, COVID)  RVPGX2
Influenza A by PCR: NEGATIVE
Influenza B by PCR: NEGATIVE
Resp Syncytial Virus by PCR: NEGATIVE
SARS Coronavirus 2 by RT PCR: POSITIVE — AB

## 2023-03-05 LAB — CBC
HCT: 29.9 % — ABNORMAL LOW (ref 39.0–52.0)
Hemoglobin: 10.2 g/dL — ABNORMAL LOW (ref 13.0–17.0)
MCH: 33.1 pg (ref 26.0–34.0)
MCHC: 34.1 g/dL (ref 30.0–36.0)
MCV: 97.1 fL (ref 80.0–100.0)
Platelets: 109 10*3/uL — ABNORMAL LOW (ref 150–400)
RBC: 3.08 MIL/uL — ABNORMAL LOW (ref 4.22–5.81)
RDW: 14.8 % (ref 11.5–15.5)
WBC: 3.1 10*3/uL — ABNORMAL LOW (ref 4.0–10.5)
nRBC: 0 % (ref 0.0–0.2)

## 2023-03-05 LAB — PROCALCITONIN: Procalcitonin: <0.1 ng/mL

## 2023-03-05 LAB — FERRITIN: Ferritin: 824 ng/mL — ABNORMAL HIGH (ref 24–336)

## 2023-03-05 LAB — LACTATE DEHYDROGENASE: LDH: 204 U/L — ABNORMAL HIGH (ref 98–192)

## 2023-03-05 LAB — D-DIMER, QUANTITATIVE: D-Dimer, Quant: 1.65 ug{FEU}/mL — ABNORMAL HIGH (ref 0.00–0.50)

## 2023-03-05 LAB — LACTIC ACID, PLASMA
Lactic Acid, Venous: 1.2 mmol/L (ref 0.5–1.9)
Lactic Acid, Venous: 1.6 mmol/L (ref 0.5–1.9)

## 2023-03-05 LAB — CK: Total CK: 795 U/L — ABNORMAL HIGH (ref 49–397)

## 2023-03-05 LAB — C-REACTIVE PROTEIN: CRP: 4 mg/dL — ABNORMAL HIGH (ref <1.0)

## 2023-03-05 MED ORDER — SODIUM CHLORIDE 0.9 % IV SOLN: Freq: Once | INTRAVENOUS | Status: DC

## 2023-03-05 MED ORDER — SODIUM CHLORIDE 0.9 % IV SOLN
2.0000 g | Freq: Once | INTRAVENOUS | Status: AC
  Administered 2023-03-05: 2 g via INTRAVENOUS
  Filled 2023-03-05: qty 12.5

## 2023-03-05 MED ORDER — MAGNESIUM HYDROXIDE 400 MG/5ML PO SUSP
30.0000 mL | Freq: Every day | ORAL | Status: DC | PRN
  Administered 2023-03-17 – 2023-03-19 (×2): 30 mL via ORAL
  Filled 2023-03-05 (×2): qty 30

## 2023-03-05 MED ORDER — TRAZODONE HCL 50 MG PO TABS
25.0000 mg | ORAL_TABLET | Freq: Every evening | ORAL | Status: DC | PRN
  Filled 2023-03-05: qty 1

## 2023-03-05 MED ORDER — ONDANSETRON HCL 4 MG PO TABS: 4.0000 mg | ORAL_TABLET | Freq: Four times a day (QID) | ORAL | Status: DC | PRN

## 2023-03-05 MED ORDER — ZINC SULFATE 220 (50 ZN) MG PO CAPS
220.0000 mg | ORAL_CAPSULE | Freq: Every day | ORAL | Status: DC
  Filled 2023-03-05: qty 1

## 2023-03-05 MED ORDER — VANCOMYCIN HCL 1250 MG/250ML IV SOLN
1250.0000 mg | Freq: Once | INTRAVENOUS | Status: AC
  Administered 2023-03-05: 1250 mg via INTRAVENOUS
  Filled 2023-03-05: qty 250

## 2023-03-05 MED ORDER — MIDODRINE HCL 5 MG PO TABS
10.0000 mg | ORAL_TABLET | Freq: Three times a day (TID) | ORAL | Status: DC
  Filled 2023-03-05: qty 2

## 2023-03-05 MED ORDER — MIDODRINE HCL 5 MG PO TABS
5.0000 mg | ORAL_TABLET | Freq: Three times a day (TID) | ORAL | Status: DC
  Administered 2023-03-05 (×2): 5 mg via ORAL
  Filled 2023-03-05 (×3): qty 1

## 2023-03-05 MED ORDER — ONDANSETRON HCL 4 MG/2ML IJ SOLN: 4.0000 mg | Freq: Four times a day (QID) | INTRAMUSCULAR | Status: DC | PRN

## 2023-03-05 MED ORDER — VITAMIN C 500 MG PO TABS
1000.0000 mg | ORAL_TABLET | Freq: Every day | ORAL | Status: DC
  Administered 2023-03-06 – 2023-03-20 (×9): 1000 mg via ORAL
  Filled 2023-03-05 (×16): qty 2

## 2023-03-05 MED ORDER — ACETAMINOPHEN 325 MG PO TABS: 650.0000 mg | ORAL_TABLET | Freq: Four times a day (QID) | ORAL | Status: DC | PRN

## 2023-03-05 MED ORDER — ACETAMINOPHEN 650 MG RE SUPP: 650.0000 mg | Freq: Four times a day (QID) | RECTAL | Status: DC | PRN

## 2023-03-05 MED ORDER — ENOXAPARIN SODIUM 40 MG/0.4ML IJ SOSY
40.0000 mg | PREFILLED_SYRINGE | INTRAMUSCULAR | Status: DC
  Administered 2023-03-05 – 2023-03-19 (×13): 40 mg via SUBCUTANEOUS
  Filled 2023-03-05 (×15): qty 0.4

## 2023-03-05 MED ORDER — SODIUM CHLORIDE 0.9 % IV SOLN: INTRAVENOUS | Status: DC

## 2023-03-05 NOTE — Assessment & Plan Note (Addendum)
03-06-2023  This is likely associated with COVID-19 and his dementia together with acute rhabdomyolysis. UA negative for infection. 03-07-2023 resolved. Pt seems confused at baseline.  03-08-2023 resolved

## 2023-03-05 NOTE — ED Notes (Signed)
 Pt was found trying to get out of bed, Pt stated he wanted privacy and to walk to the bathroom, when standing with Pt, he needed assistance x2, pivoted to a bedside commode. Pt had a BM. Bed linen and Pts clothes were changed. Pt was placed back into bed and bed alarm turned on. VS obtained. Small skin tear was noted to L hand, gauze and coban was placed

## 2023-03-05 NOTE — Progress Notes (Addendum)
 PROGRESS NOTE    Drew Russell  FMW:[id number] DOB: [birth year] DOA: 03/04/2023 PCP: Drew Pao, NP  Chief Complaint  Patient presents with   Altered Mental Status    Hospital Course:  Drew Russell is 78 y.o. male with medical history of hypertension, dyslipidemia, dementia, who presented to the ED with acute onset of altered mental status and fall.  On arrival to the ED vital signs are normal.  Patient was found to have creatinine of 1.45 with BUN of 34.  Mild elevation in AST and total bili 1.6.  CK 1041 and high-sensitivity troponin 36.  Some thrombocytopenia of 143.  COVID-19 positive Noncon head CT without acute abnormalities.  Chest x-ray without acute cardiopulmonary disease.   Subjective: On evaluation this morning patient is pleasantly confused.  He does not member that he is in the hospital.  Has no acute complaints.   Objective: Vitals:   03/05/23 0330 03/05/23 0400 03/05/23 0500 03/05/23 0700  BP: (!) 124/53 111/70 137/61 (!) 117/55  Pulse: (!) 54 (!) 52 (!) 57 (!) 57  Resp: (!) 9 10 16 12   Temp:   97.9 F (36.6 C)   TempSrc:      SpO2: 100% 100% 100% 100%  Weight:        Intake/Output Summary (Last 24 hours) at 03/05/2023 1002 Last data filed at 03/05/2023 0228 Gross per 24 hour  Intake 500 ml  Output 1000 ml  Net -500 ml   Filed Weights   03/05/23 0023  Weight: 59 kg    Examination: General exam: Appears calm and comfortable, NAD  Respiratory system: No work of breathing, symmetric chest wall expansion, some upper airway Rales, coughing Cardiovascular system: S1 & S2 heard, RRR.  Gastrointestinal system: Abdomen is nondistended, soft and nontender.  Neuro: Pleasantly confused, requires prompting to answer questions, does not accurately report situation, time or year. Extremities: Symmetric, expected ROM Skin: No rashes, lesions Psychiatry: Demonstrates appropriate judgement and insight. Mood & affect appropriate for situation.   Assessment &  Plan:  Principal Problem:   Rhabdomyolysis Active Problems:   COVID-19 virus infection   Hypotension   Acute encephalopathy   Dyslipidemia    Rhabdomyolysis - Atraumatic, secondary to prolonged downtime complicated by COVID-19 - Continue with hydration and follow CK  Sepsis --Septic criteria met on arrival with hypotension, tachypnea, leukopenia, source: COVID-19 - Status post broad-spectrum antibiotics x 1 in the ED - No concern of bacterial infection at this time -- Treatment as below  COVID-19 - Likely precipitated generalized weakness and fall - Procal unremarkable, unlikely to have superimposed bacterial pneumonia - Continue isolation - No hypoxia at this time - High ferritin - Continue symptomatic and supportive meds  Hypotension - Continue IV fluid hydration as above - Hold antihypertensives - Continue with midodrine , taper as tolerated  Acute metabolic encephalopathy Dementia - Acute worsening likely secondary to COVID-19, dehydration, prolonged downtime. - Continue to monitor - Frequent reorientation - Delirium precautions - UA: WNL - Head CT negative  Dyslipidemia - Continue statin   DVT prophylaxis: lovenox    Code Status: Full Code Family Communication: none at bedside.  Attempted to reach sister, Drew Russell, by phone.  No answer. Disposition:  Status is: Inpatient     Consultants:      Procedures:    Antimicrobials:  Anti-infectives (From admission, onward)    Start     Dose/Rate Route Frequency Ordered Stop   03/05/23 0130  vancomycin  (VANCOREADY) IVPB 1250 mg/250 mL  1,250 mg 166.7 mL/hr over 90 Minutes Intravenous  Once 03/05/23 0127 03/05/23 0311   03/05/23 0015  ceFEPIme  (MAXIPIME ) 2 g in sodium chloride  0.9 % 100 mL IVPB        2 g 200 mL/hr over 30 Minutes Intravenous  Once 03/05/23 0009 03/05/23 0128       Data Reviewed: I have personally reviewed following labs and imaging studies CBC: Recent Labs  Lab 03/04/23 1551  03/05/23 0320  WBC 4.6 3.1*  NEUTROABS 3.5  --   HGB 12.3* 10.2*  HCT 36.3* 29.9*  MCV 97.1 97.1  PLT 143* 109*   Basic Metabolic Panel: Recent Labs  Lab 03/04/23 1551 03/05/23 0319  NA 137 137  K 4.3 4.1  CL 101 107  CO2 24 21*  GLUCOSE 93 96  BUN 34* 41*  CREATININE 1.45* 1.75*  CALCIUM  8.7* 7.6*   GFR: Estimated Creatinine Clearance: 28.6 mL/min (A) (by C-G formula based on SCr of 1.75 mg/dL (H)). Liver Function Tests: Recent Labs  Lab 03/04/23 1551  AST 61*  ALT 23  ALKPHOS 52  BILITOT 1.6*  PROT 6.5  ALBUMIN 3.8   CBG: No results for input(s): GLUCAP in the last 168 hours.  Recent Results (from the past 240 hours)  Blood Culture (routine x 2)     Status: None (Preliminary result)   Collection Time: 03/04/23 11:51 PM   Specimen: BLOOD  Result Value Ref Range Status   Specimen Description BLOOD LEFT ARM  Final   Special Requests   Final    BOTTLES DRAWN AEROBIC AND ANAEROBIC Blood Culture results may not be optimal due to an inadequate volume of blood received in culture bottles   Culture   Final    NO GROWTH < 12 HOURS Performed at [facility], [address], [place], [place] [address]    Report Status PENDING  Incomplete  Blood Culture (routine x 2)     Status: None (Preliminary result)   Collection Time: 03/05/23 12:17 AM   Specimen: BLOOD  Result Value Ref Range Status   Specimen Description BLOOD LEFT FOREARM  Final   Special Requests   Final    BOTTLES DRAWN AEROBIC AND ANAEROBIC Blood Culture adequate volume   Culture   Final    NO GROWTH < 12 HOURS Performed at [facility], [address], [place], [place] [address]    Report Status PENDING  Incomplete  Resp panel by RT-PCR (RSV, Flu A&B, Covid) Anterior Nasal Swab     Status: Abnormal   Collection Time: 03/05/23 12:17 AM   Specimen: Anterior Nasal Swab  Result Value Ref Range Status   SARS Coronavirus 2 by RT PCR POSITIVE (A) NEGATIVE Final    Comment:  (NOTE) SARS-CoV-2 target nucleic acids are DETECTED.  The SARS-CoV-2 RNA is generally detectable in upper respiratory specimens during the acute phase of infection. Positive results are indicative of the presence of the identified virus, but do not rule out bacterial infection or co-infection with other pathogens not detected by the test. Clinical correlation with patient history and other diagnostic information is necessary to determine patient infection status. The expected result is Negative.  Fact Sheet for Patients: bloggercourse.com  Fact Sheet for Healthcare Providers: seriousbroker.it  This test is not yet approved or cleared by the [place]  FDA and  has been authorized for detection and/or diagnosis of SARS-CoV-2 by FDA under an Emergency Use Authorization (EUA).  This EUA will remain in effect (meaning this test  can be used) for the duration of  the COVID-19 declaration under Section 564(b)(1) of the A ct, 21 U.S.C. section 360bbb-3(b)(1), unless the authorization is terminated or revoked sooner.     Influenza A by PCR NEGATIVE NEGATIVE Final   Influenza B by PCR NEGATIVE NEGATIVE Final    Comment: (NOTE) The Xpert Xpress SARS-CoV-2/FLU/RSV plus assay is intended as an aid in the diagnosis of influenza from Nasopharyngeal swab specimens and should not be used as a sole basis for treatment. Nasal washings and aspirates are unacceptable for Xpert Xpress SARS-CoV-2/FLU/RSV testing.  Fact Sheet for Patients: bloggercourse.com  Fact Sheet for Healthcare Providers: seriousbroker.it  This test is not yet approved or cleared by the [place]  FDA and has been authorized for detection and/or diagnosis of SARS-CoV-2 by FDA under an Emergency Use Authorization (EUA). This EUA will remain in effect (meaning this test can be used) for the duration of the COVID-19  declaration under Section 564(b)(1) of the Act, 21 U.S.C. section 360bbb-3(b)(1), unless the authorization is terminated or revoked.     Resp Syncytial Virus by PCR NEGATIVE NEGATIVE Final    Comment: (NOTE) Fact Sheet for Patients: bloggercourse.com  Fact Sheet for Healthcare Providers: seriousbroker.it  This test is not yet approved or cleared by the [place]  FDA and has been authorized for detection and/or diagnosis of SARS-CoV-2 by FDA under an Emergency Use Authorization (EUA). This EUA will remain in effect (meaning this test can be used) for the duration of the COVID-19 declaration under Section 564(b)(1) of the Act, 21 U.S.C. section 360bbb-3(b)(1), unless the authorization is terminated or revoked.  Performed at [facility], [address] [place], [place] [address]      Radiology Studies: [facility] Chest [place] 1 View Result Date: 03/05/2023 CLINICAL DATA:  Altered mental status with question of sepsis. EXAM: PORTABLE CHEST 1 VIEW COMPARISON:  None Available. FINDINGS: The heart size and mediastinal contours are within normal limits. There is moderate severity calcification of the aortic arch. The lungs are hyperinflated. There is no evidence of an acute infiltrate, pleural effusion or pneumothorax. Multilevel degenerative changes seen throughout the thoracic spine. IMPRESSION: No active cardiopulmonary disease. Electronically Signed   By: Suzen Dials M.D.   On: 03/05/2023 00:35   CT Head Wo Contrast Result Date: 03/04/2023 CLINICAL DATA:  Head trauma, minor (Age >= 65y) fall EXAM: CT HEAD WITHOUT CONTRAST TECHNIQUE: Contiguous axial images were obtained from the base of the skull through the vertex without intravenous contrast. RADIATION DOSE REDUCTION: This exam was performed according to the departmental dose-optimization program which includes automated exposure control, adjustment of the mA and/or kV  according to patient size and/or use of iterative reconstruction technique. COMPARISON:  None Available. FINDINGS: Brain: Cerebral ventricle sizes are concordant with the degree of cerebral volume loss. Patchy and confluent areas of decreased attenuation are noted throughout the deep and periventricular white matter of the cerebral hemispheres bilaterally, compatible with chronic microvascular ischemic disease. No evidence of large-territorial acute infarction. No parenchymal hemorrhage. No mass lesion. No extra-axial collection. No mass effect or midline shift. No hydrocephalus. Basilar cisterns are patent. Vascular: No hyperdense vessel. Atherosclerotic calcifications are present within the cavernous internal carotid arteries. Skull: No acute fracture or focal lesion. Sinuses/Orbits: Paranasal sinuses and mastoid air cells are clear. Bilateral lens replacement. Otherwise the orbits are unremarkable. Other: None. IMPRESSION: No acute intracranial abnormality. Electronically Signed   By: Morgane  Naveau M.D.   On: 03/04/2023 18:48    Scheduled Meds:  vitamin C   1,000 mg Oral Daily   enoxaparin  (LOVENOX ) injection  40 mg Subcutaneous Q24H   midodrine   10 mg Oral TID WC   zinc  sulfate (50mg  elemental zinc )  220 mg Oral Daily   Continuous Infusions:  sodium chloride  150 mL/hr at 03/05/23 0201     LOS: 0 days    Time spent:  55min  Aarica Wax, DO [facility]  To contact the attending physician between 7A-7P please use Epic Chat. To contact the covering physician during after hours 7P-7A, please review Amion.   03/05/2023, 10:02 AM   *This document has been created with the assistance of dictation software. Please excuse typographical errors. *

## 2023-03-05 NOTE — Progress Notes (Signed)
 ED Pharmacy Antibiotic Sign Off An antibiotic consult was received from an ED provider for Vancomycin  per pharmacy dosing for sepsis. A chart review was completed to assess appropriateness.   The following one time order(s) were placed:  Vancomycin  1250 mg IV X 1.   Further antibiotic and/or antibiotic pharmacy consults should be ordered by the admitting provider if indicated.   Thank you for allowing pharmacy to be a part of this patient's care.   Silver Selinda BIRCH, [facility]  Clinical Pharmacist 03/05/23 1:28 AM

## 2023-03-05 NOTE — Assessment & Plan Note (Addendum)
 03-06-2023 CK has come down nicely. No further need for IVF. Can potentially go home if pt's family can be located and will take him back. Will need PT/OT assessment. 03-07-2023 CK down to 478. No need to continue checking it. Needs SNF placement.  03-08-2023 resolved.

## 2023-03-05 NOTE — Assessment & Plan Note (Addendum)
 03-06-2023 Will hold statin therapy given his rhabdomyolysis. 03-07-2023 stable. Restart statin at discharge. 03-08-2023 stable  03-09-2023 stable.

## 2023-03-05 NOTE — ED Notes (Signed)
Report to catherine, rn

## 2023-03-05 NOTE — Assessment & Plan Note (Addendum)
On admission, Likely due to lack po liquid intake. Pt is not septic. Stop midodrine. Monitor BP. 03-07-2023 resolved. BP stable.  03-08-2023 resolved.

## 2023-03-05 NOTE — H&P (Addendum)
 [place]   PATIENT NAME: Drew Russell    MR#:  [id number]  DATE OF BIRTH:  Feb 09, 1946  DATE OF ADMISSION:  03/04/2023  PRIMARY CARE PHYSICIAN: Melvin Pao, NP   Patient is coming from: Home  REQUESTING/REFERRING PHYSICIAN: Cyrena Mylar, MD  CHIEF COMPLAINT:   Chief Complaint  Patient presents with   Altered Mental Status    HISTORY OF PRESENT ILLNESS:  Drew Russell is a 78 y.o. Caucasian male with medical history significant for hypertension and dyslipidemia as well as dementia, presented to the emergency room with acute onset of altered mental status and fall.  Apparently fell at home yesterday and his brother was feeding him while he was on the floor waiting for him to get enough energy to get up.  He denied any chest pain or palpitations earlier.  No cough or wheezing or dyspnea.  No neck pain or stiffness.  No fever or chills.  He depleted and dehydrated with cracked lips and dry mucous membranes.  he was very somnolent during my interview and only responsive to painful stimuli and therefore no further history could be obtained.  ED Course: When the patient came to the ER, vital signs were within normal.  Labs revealed BUN of 34 with a creatinine of 1.45 close to previous levels.  Calcium  was 8.7 and AST was 61 with total bili 1.6 with otherwise unremarkable CMP.  Total CK was 1041 and high sensitive troponin was 36.  CBC showed anemia with hemoglobin of 12.3 and hematocrit 36.3 better than previous levels and thrombocytopenia of 143 compared to 185 in October last year.  Lactic acid is 1.2.  COVID-19 per second back positive and the rest of the respiratory panel came back negative.  Blood cultures were sent. EKG as reviewed by me : EKG showed likely sinus rhythm with a rate to 83 with bilateral T wave inversion. Imaging: Noncontrasted head CT scan revealed no acute ventricular abnormalities.  Portable chest x-ray showed no acute cardiopulmonary disease.  The  patient will be admitted to the medical bed for further evaluation and management. PAST MEDICAL HISTORY:   Past Medical History:  Diagnosis Date   History of chicken pox    History of measles as a child    History of mumps as a child    Hyperlipidemia    Hypertension   -Dementia  PAST SURGICAL HISTORY:   Past Surgical History:  Procedure Laterality Date   ANGIOPLASTY  05/21/1993   [facility]   Cardiac Catherterization     per pt had angioplast in 1995, no stent   CATARACT EXTRACTION Bilateral 2007    SOCIAL HISTORY:   Social History   Tobacco Use   Smoking status: Former    Current packs/day: 0.00    Average packs/day: 1 pack/day for 50.0 years (50.0 ttl pk-yrs)    Types: Cigarettes    Start date: 01/02/1964    Quit date: 01/01/2014    Years since quitting: 9.1   Smokeless tobacco: Never   Tobacco comments:    smoked 1ppd 50 years. Quit 2015  Substance Use Topics   Alcohol  use: No    Alcohol /week: 0.0 standard drinks of alcohol     Comment: Previous heavy drinker    FAMILY HISTORY:   Family History  Problem Relation Age of Onset   Stroke Mother    Heart disease Father    Heart attack Father    Colon cancer Sister    Diabetes  Sister        type 2   Diabetes Sister        type 2    DRUG ALLERGIES:  No Known Allergies  REVIEW OF SYSTEMS:   ROS As per history of present illness. All pertinent systems were reviewed above. Constitutional, HEENT, cardiovascular, respiratory, GI, GU, musculoskeletal, neuro, psychiatric, endocrine, integumentary and hematologic systems were reviewed and are otherwise negative/unremarkable except for positive findings mentioned above in the HPI.   MEDICATIONS AT HOME:   Prior to Admission medications   Medication Sig Start Date End Date Taking? Authorizing Provider  amLODipine  (NORVASC ) 10 MG tablet Take 1 tablet (10 mg total) by mouth daily. 12/03/22   Melvin Pao, NP  atorvastatin  (LIPITOR) 20  MG tablet Take 1 tablet (20 mg total) by mouth daily. 12/03/22   Melvin Pao, NP  Omega-3 Fatty Acids (FISH OIL) 1000 MG CAPS Take 1,200 mg by mouth daily.    [provider]      VITAL SIGNS:  Blood pressure (!) 87/56, pulse (!) 59, temperature 97.9 F (36.6 C), resp. rate 11, weight 59 kg, SpO2 98%.  PHYSICAL EXAMINATION:  Physical Exam  GENERAL:  78 y.o.-year-old Caucasian male patient lying in the bed with no acute distress.  He was very somnolent and difficult to arouse. EYES: Pupils equal, round, reactive to light and accommodation. No scleral icterus. Extraocular muscles intact.  HEENT: Head atraumatic, normocephalic. Oropharynx and nasopharynx clear.  NECK:  Supple, no jugular venous distention. No thyroid  enlargement, no tenderness.  LUNGS: Normal breath sounds bilaterally, no wheezing, rales,rhonchi or crepitation. No use of accessory muscles of respiration.  CARDIOVASCULAR: Regular rate and rhythm, S1, S2 normal. No murmurs, rubs, or gallops.  ABDOMEN: Soft, nondistended, nontender. Bowel sounds present. No organomegaly or mass.  EXTREMITIES: No pedal edema, cyanosis, or clubbing.  NEUROLOGIC: Cranial nerves II through XII are intact. Muscle strength 5/5 in all extremities. Sensation intact. Gait not checked.  PSYCHIATRIC: The patient is alert and oriented x 3.  Normal affect and good eye contact. SKIN: Bilateral upper extremity contusions. LABORATORY PANEL:   CBC Recent Labs  Lab 03/04/23 1551  WBC 4.6  HGB 12.3*  HCT 36.3*  PLT 143*   ------------------------------------------------------------------------------------------------------------------  Chemistries  Recent Labs  Lab 03/04/23 1551  NA 137  K 4.3  CL 101  CO2 24  GLUCOSE 93  BUN 34*  CREATININE 1.45*  CALCIUM  8.7*  AST 61*  ALT 23  ALKPHOS 52  BILITOT 1.6*    ------------------------------------------------------------------------------------------------------------------  Cardiac Enzymes No results for input(s): TROPONINI in the last 168 hours. ------------------------------------------------------------------------------------------------------------------  RADIOLOGY:  DG Chest Port 1 View Result Date: 03/05/2023 CLINICAL DATA:  Altered mental status with question of sepsis. EXAM: PORTABLE CHEST 1 VIEW COMPARISON:  None Available. FINDINGS: The heart size and mediastinal contours are within normal limits. There is moderate severity calcification of the aortic arch. The lungs are hyperinflated. There is no evidence of an acute infiltrate, pleural effusion or pneumothorax. Multilevel degenerative changes seen throughout the thoracic spine. IMPRESSION: No active cardiopulmonary disease. Electronically Signed   By: Suzen Dials M.D.   On: 03/05/2023 00:35   CT Head Wo Contrast Result Date: 03/04/2023 CLINICAL DATA:  Head trauma, minor (Age >= 65y) fall EXAM: CT HEAD WITHOUT CONTRAST TECHNIQUE: Contiguous axial images were obtained from the base of the skull through the vertex without intravenous contrast. RADIATION DOSE REDUCTION: This exam was performed according to the departmental dose-optimization program which includes automated exposure control, adjustment of  the mA and/or kV according to patient size and/or use of iterative reconstruction technique. COMPARISON:  None Available. FINDINGS: Brain: Cerebral ventricle sizes are concordant with the degree of cerebral volume loss. Patchy and confluent areas of decreased attenuation are noted throughout the deep and periventricular white matter of the cerebral hemispheres bilaterally, compatible with chronic microvascular ischemic disease. No evidence of large-territorial acute infarction. No parenchymal hemorrhage. No mass lesion. No extra-axial collection. No mass effect or midline shift. No  hydrocephalus. Basilar cisterns are patent. Vascular: No hyperdense vessel. Atherosclerotic calcifications are present within the cavernous internal carotid arteries. Skull: No acute fracture or focal lesion. Sinuses/Orbits: Paranasal sinuses and mastoid air cells are clear. Bilateral lens replacement. Otherwise the orbits are unremarkable. Other: None. IMPRESSION: No acute intracranial abnormality. Electronically Signed   By: Morgane  Naveau M.D.   On: 03/04/2023 18:48      IMPRESSION AND PLAN:  Assessment and Plan: * Rhabdomyolysis - The patient will be admitted to a medical bed. - We will continue aggressive hydration with IV normal saline. - We will follow serial CK levels.  COVID-19 virus infection - This could be the main reason for her generalized weakness that could have contributed to his fall. - He will be placed on isolation. - He has no current hypoxia. - Symptomatic and supportive management will be provided. - Okay to place him on vitamin C  and zinc  sulfate. - We will check inflammatory markers.  Hypotension - The patient be hydrated with IV normal saline. - We will hold off antihypertensives. - We will add midodrine .  Acute encephalopathy - This is likely associated with COVID-19 and his dementia together with acute rhabdomyolysis. - Will check urinalysis. - No current strong evidence for acute CVA and chest x-ray came back negative.  Dyslipidemia Will stop statin therapy given his rhabdomyolysis.    DVT prophylaxis: Lovenox .  Advanced Care Planning:  Code Status: full code.  Family Communication:  The plan of care was discussed in details with the patient (and family). I answered all questions. The patient agreed to proceed with the above mentioned plan. Further management will depend upon hospital course. Disposition Plan: Back to previous home environment Consults called: none.  All the records are reviewed and case discussed with ED provider.  Status is:  Inpatient  At the time of the admission, it appears that the appropriate admission status for this patient is inpatient.  This is judged to be reasonable and necessary in order to provide the required intensity of service to ensure the patient's safety given the presenting symptoms, physical exam findings and initial radiographic and laboratory data in the context of comorbid conditions.  The patient requires inpatient status due to high intensity of service, high risk of further deterioration and high frequency of surveillance required.  I certify that at the time of admission, it is my clinical judgment that the patient will require inpatient hospital care extending more than 2 midnights.                            Dispo: The patient is from: Home              Anticipated d/c is to: Home              Patient currently is not medically stable to d/c.              Difficult to place patient: No  Madison DELENA Peaches M.D on  03/05/2023 at 2:34 AM  [facility]   From 7 PM-7 AM, contact night-coverage [email or web address]  CC: Primary care physician; Melvin Pao, NP

## 2023-03-05 NOTE — Assessment & Plan Note (Addendum)
03-06-2023 continue supportive care. On RA. 03-07-2023 stable. On RA. 03-08-2023 stable. On RA. 03-09-2023 stable.  03-12-2023 stable. On RA.

## 2023-03-06 DIAGNOSIS — G9341 Metabolic encephalopathy: Secondary | ICD-10-CM | POA: Diagnosis not present

## 2023-03-06 DIAGNOSIS — D631 Anemia in chronic kidney disease: Secondary | ICD-10-CM

## 2023-03-06 DIAGNOSIS — E861 Hypovolemia: Secondary | ICD-10-CM | POA: Diagnosis not present

## 2023-03-06 DIAGNOSIS — N1831 Chronic kidney disease, stage 3a: Secondary | ICD-10-CM

## 2023-03-06 DIAGNOSIS — U071 COVID-19: Secondary | ICD-10-CM | POA: Diagnosis not present

## 2023-03-06 DIAGNOSIS — D61818 Other pancytopenia: Secondary | ICD-10-CM | POA: Insufficient documentation

## 2023-03-06 DIAGNOSIS — M6282 Rhabdomyolysis: Secondary | ICD-10-CM | POA: Diagnosis not present

## 2023-03-06 LAB — CK: Total CK: 585 U/L — ABNORMAL HIGH (ref 49–397)

## 2023-03-06 NOTE — Assessment & Plan Note (Addendum)
03-06-2023 chronic anemia. 03-07-2023 stable. 03-08-2023 stable 03-09-2023 stable. HgB 11.2 g/dl

## 2023-03-06 NOTE — ED Notes (Signed)
 Replaced all monitoring equipment after pt removal and provided warm blankets. Bed locked and low and call bell within reach

## 2023-03-06 NOTE — ED Notes (Signed)
 Patient repositioned in bed and given a breakfast tray.

## 2023-03-06 NOTE — Progress Notes (Signed)
 PROGRESS NOTE    Drew Russell  ZOX:[id number] DOB: June 03, 1945 DOA: 03/04/2023 PCP: Aileen Alexanders, NP  Subjective: Pt seen and examined. Pt is confused. EMS reports pt with hx of dementia. Pt is on RA. CK 585. Highest was 1041. Procal is negative. Scr slightly above baseline. Baseline Scr 1.4-1.6   Hospital Course: HPI: Drew Russell is a 78 y.o. Caucasian male with medical history significant for hypertension and dyslipidemia as well as dementia, presented to the emergency room with acute onset of altered mental status and fall.  Apparently fell at home yesterday and his brother was feeding him while he was on the floor waiting for him to get enough energy to get up.  He denied any chest pain or palpitations earlier.  No cough or wheezing or dyspnea.  No neck pain or stiffness.  No fever or chills.  He depleted and dehydrated with cracked lips and dry mucous membranes.  he was very somnolent during my interview and only responsive to painful stimuli and therefore no further history could be obtained.   Significant Events: Admitted 03/04/2023 non-traumatic rhabdomyolysis and Covid-19 infection   Significant Labs: BUN of 34 with a creatinine of 1.45 close to previous levels. Calcium  was 8.7 and AST was 61 with total bili 1.6 with otherwise unremarkable CMP. Total CK was 1041 and high sensitive troponin was 36. CBC showed anemia with hemoglobin of 12.3 and hematocrit 36.3 better than previous levels and thrombocytopenia of 143 compared to 185 in October last year. Lactic acid is 1.2. COVID-19 PCR was POSITIVE and the rest of the respiratory panel came back negative.   Significant Imaging Studies: Noncontrasted head CT scan revealed no acute ventricular abnormalities.  Portable chest x-ray showed no acute cardiopulmonary disease.   Antibiotic Therapy: Anti-infectives (From admission, onward)    Start     Dose/Rate Route Frequency Ordered Stop   03/05/23 0130  vancomycin  (VANCOREADY)  IVPB 1250 mg/250 mL        1,250 mg 166.7 mL/hr over 90 Minutes Intravenous  Once 03/05/23 0127 03/05/23 0311   03/05/23 0015  ceFEPIme  (MAXIPIME ) 2 g in sodium chloride  0.9 % 100 mL IVPB        2 g 200 mL/hr over 30 Minutes Intravenous  Once 03/05/23 0009 03/05/23 0128       Procedures:   Consultants:     Assessment and Plan: * Non-traumatic rhabdomyolysis 03-06-2023 CK has come down nicely. No further need for IVF. Can potentially go home if pt's family can be located and will take him back. Will need PT/OT assessment.  COVID-19 virus infection [phone] continue supportive care. On RA.  Acute metabolic encephalopathy 03-06-2023  This is likely associated with COVID-19 and his dementia together with acute rhabdomyolysis. UA negative for infection.  Dyslipidemia 03-06-2023 Will hold statin therapy given his rhabdomyolysis.  Hypotension Likely due to lack po liquid intake. Pt is not septic. Stop midodrine . Monitor BP.  Pancytopenia (HCC) 03-06-2023 leukopenia and thrombocytopenia likely due to his covid-19. Pt was already anemia prior to admission. Pancytopenia not from any known cancer.  Anemia in chronic kidney disease (CKD) 03-06-2023 chronic anemia.  CKD stage 3a, GFR 45-59 ml/min (HCC) - baseline scR 1.4-1.6 03-06-2023 Scr slightly above his baseline of 1.4-1.6   DVT prophylaxis: enoxaparin  (LOVENOX ) injection 40 mg Start: 03/05/23 0800  None because of comfort care   Code Status: Full Code Family Communication: no family at bedside Disposition Plan: return home Reason for continuing need for hospitalization: medically stable for  DC back home. RN to attempt to contact family.  Objective: Vitals:   03/06/23 0730 03/06/23 0745 03/06/23 0800 03/06/23 1002  BP: 131/63   (!) 116/44  Pulse:    78  Resp: 13 16 (!) 27 17  Temp:    98.3 F (36.8 C)  TempSrc:      SpO2:    100%  Weight:        Intake/Output Summary (Last 24 hours) at 03/06/2023 1017 Last  data filed at 03/06/2023 0034 Gross per 24 hour  Intake 1000 ml  Output --  Net 1000 ml   Filed Weights   03/05/23 0023  Weight: 59 kg    Examination:  Physical Exam Vitals and nursing note reviewed.  Constitutional:      General: He is not in acute distress.    Appearance: He is not ill-appearing, toxic-appearing or diaphoretic.     Comments: Chronically ill appearing  HENT:     Head: Normocephalic and atraumatic.     Nose: Nose normal.  Eyes:     General: No scleral icterus. Cardiovascular:     Rate and Rhythm: Normal rate and regular rhythm.  Pulmonary:     Effort: Pulmonary effort is normal.     Breath sounds: Normal breath sounds.     Comments: On RA. No coughing Abdominal:     General: Abdomen is flat. Bowel sounds are normal. There is no distension.     Palpations: Abdomen is soft.     Tenderness: There is no abdominal tenderness.  Musculoskeletal:     Right lower leg: No edema.     Left lower leg: No edema.  Skin:    General: Skin is warm and dry.     Capillary Refill: Capillary refill takes less than 2 seconds.  Neurological:     Mental Status: He is alert. He is disoriented.     Comments: Thought it was the year 2024. Did not know the month. Unclear where he is.     Data Reviewed: I have personally reviewed following labs and imaging studies  CBC: Recent Labs  Lab 03/04/23 1551 03/05/23 0320  WBC 4.6 3.1*  NEUTROABS 3.5  --   HGB 12.3* 10.2*  HCT 36.3* 29.9*  MCV 97.1 97.1  PLT 143* 109*   Basic Metabolic Panel: Recent Labs  Lab 03/04/23 1551 03/05/23 0319  NA 137 137  K 4.3 4.1  CL 101 107  CO2 24 21*  GLUCOSE 93 96  BUN 34* 41*  CREATININE 1.45* 1.75*  CALCIUM  8.7* 7.6*   GFR: Estimated Creatinine Clearance: 28.6 mL/min (A) (by C-G formula based on SCr of 1.75 mg/dL (H)). Liver Function Tests: Recent Labs  Lab 03/04/23 1551  AST 61*  ALT 23  ALKPHOS 52  BILITOT 1.6*  PROT 6.5  ALBUMIN 3.8   Cardiac Enzymes: Recent Labs   Lab 03/04/23 2154 03/05/23 0319 03/06/23 0241  CKTOTAL 1,041* 795* 585*   Anemia Panel: Recent Labs    03/05/23 0319  FERRITIN 824*   Sepsis Labs: Recent Labs  Lab 03/05/23 0017 03/05/23 0209 03/05/23 0319  PROCALCITON  --   --  <0.10  LATICACIDVEN 1.2 1.6  --     Recent Results (from the past 240 hours)  Blood Culture (routine x 2)     Status: None (Preliminary result)   Collection Time: 03/04/23 11:51 PM   Specimen: BLOOD  Result Value Ref Range Status   Specimen Description BLOOD LEFT ARM  Final   Special  Requests   Final    BOTTLES DRAWN AEROBIC AND ANAEROBIC Blood Culture results may not be optimal due to an inadequate volume of blood received in culture bottles   Culture   Final    NO GROWTH < 12 HOURS Performed at [facility], [address] [place], [place] [address]    Report Status PENDING  Incomplete  Blood Culture (routine x 2)     Status: None (Preliminary result)   Collection Time: 03/05/23 12:17 AM   Specimen: BLOOD  Result Value Ref Range Status   Specimen Description BLOOD LEFT FOREARM  Final   Special Requests   Final    BOTTLES DRAWN AEROBIC AND ANAEROBIC Blood Culture adequate volume   Culture   Final    NO GROWTH < 12 HOURS Performed at [facility], [address] [place], [place] [address]    Report Status PENDING  Incomplete  Resp panel by RT-PCR (RSV, Flu A&B, Covid) Anterior Nasal Swab     Status: Abnormal   Collection Time: 03/05/23 12:17 AM   Specimen: Anterior Nasal Swab  Result Value Ref Range Status   SARS Coronavirus 2 by RT PCR POSITIVE (A) NEGATIVE Final    Comment: (NOTE) SARS-CoV-2 target nucleic acids are DETECTED.  The SARS-CoV-2 RNA is generally detectable in upper respiratory specimens during the acute phase of infection. Positive results are indicative of the presence of the identified virus, but do not rule out bacterial infection or co-infection with other pathogens not detected by the test.  Clinical correlation with patient history and other diagnostic information is necessary to determine patient infection status. The expected result is Negative.  Fact Sheet for Patients: BloggerCourse.com  Fact Sheet for Healthcare Providers: SeriousBroker.it  This test is not yet approved or cleared by the [place]  FDA and  has been authorized for detection and/or diagnosis of SARS-CoV-2 by FDA under an Emergency Use Authorization (EUA).  This EUA will remain in effect (meaning this test can be used) for the duration of  the COVID-19 declaration under Section 564(b)(1) of the A ct, 21 U.S.C. section 360bbb-3(b)(1), unless the authorization is terminated or revoked sooner.     Influenza A by PCR NEGATIVE NEGATIVE Final   Influenza B by PCR NEGATIVE NEGATIVE Final    Comment: (NOTE) The Xpert Xpress SARS-CoV-2/FLU/RSV plus assay is intended as an aid in the diagnosis of influenza from Nasopharyngeal swab specimens and should not be used as a sole basis for treatment. Nasal washings and aspirates are unacceptable for Xpert Xpress SARS-CoV-2/FLU/RSV testing.  Fact Sheet for Patients: BloggerCourse.com  Fact Sheet for Healthcare Providers: SeriousBroker.it  This test is not yet approved or cleared by the [place]  FDA and has been authorized for detection and/or diagnosis of SARS-CoV-2 by FDA under an Emergency Use Authorization (EUA). This EUA will remain in effect (meaning this test can be used) for the duration of the COVID-19 declaration under Section 564(b)(1) of the Act, 21 U.S.C. section 360bbb-3(b)(1), unless the authorization is terminated or revoked.     Resp Syncytial Virus by PCR NEGATIVE NEGATIVE Final    Comment: (NOTE) Fact Sheet for Patients: BloggerCourse.com  Fact Sheet for Healthcare  Providers: SeriousBroker.it  This test is not yet approved or cleared by the [place]  FDA and has been authorized for detection and/or diagnosis of SARS-CoV-2 by FDA under an Emergency Use Authorization (EUA). This EUA will remain in effect (meaning this test can be used) for the duration of the COVID-19 declaration under  Section 564(b)(1) of the Act, 21 U.S.C. section 360bbb-3(b)(1), unless the authorization is terminated or revoked.  Performed at [facility], [address] [place], [place] [address]      Radiology Studies: [facility] Chest [place] 1 View Result Date: 03/05/2023 CLINICAL DATA:  Altered mental status with question of sepsis. EXAM: PORTABLE CHEST 1 VIEW COMPARISON:  None Available. FINDINGS: The heart size and mediastinal contours are within normal limits. There is moderate severity calcification of the aortic arch. The lungs are hyperinflated. There is no evidence of an acute infiltrate, pleural effusion or pneumothorax. Multilevel degenerative changes seen throughout the thoracic spine. IMPRESSION: No active cardiopulmonary disease. Electronically Signed   By: Virgle Grime M.D.   On: 03/05/2023 00:35   CT Head Wo Contrast Result Date: 03/04/2023 CLINICAL DATA:  Head trauma, minor (Age >= 65y) fall EXAM: CT HEAD WITHOUT CONTRAST TECHNIQUE: Contiguous axial images were obtained from the base of the skull through the vertex without intravenous contrast. RADIATION DOSE REDUCTION: This exam was performed according to the departmental dose-optimization program which includes automated exposure control, adjustment of the mA and/or kV according to patient size and/or use of iterative reconstruction technique. COMPARISON:  None Available. FINDINGS: Brain: Cerebral ventricle sizes are concordant with the degree of cerebral volume loss. Patchy and confluent areas of decreased attenuation are noted throughout the deep and periventricular white matter of  the cerebral hemispheres bilaterally, compatible with chronic microvascular ischemic disease. No evidence of large-territorial acute infarction. No parenchymal hemorrhage. No mass lesion. No extra-axial collection. No mass effect or midline shift. No hydrocephalus. Basilar cisterns are patent. Vascular: No hyperdense vessel. Atherosclerotic calcifications are present within the cavernous internal carotid arteries. Skull: No acute fracture or focal lesion. Sinuses/Orbits: Paranasal sinuses and mastoid air cells are clear. Bilateral lens replacement. Otherwise the orbits are unremarkable. Other: None. IMPRESSION: No acute intracranial abnormality. Electronically Signed   By: Morgane  Naveau M.D.   On: 03/04/2023 18:48    Scheduled Meds:  vitamin C   1,000 mg Oral Daily   enoxaparin  (LOVENOX ) injection  40 mg Subcutaneous Q24H   Continuous Infusions:   LOS: 1 day   Time spent: 40 minutes  Unk Garb, DO  [facility]  03/06/2023, 10:17 AM

## 2023-03-06 NOTE — Evaluation (Signed)
 Physical Therapy Evaluation Patient Details Name: Drew Russell MRN: [id number] DOB: 1945/08/02 Today's Date: 03/06/2023  History of Present Illness  Pt is a 78 year old male presented to the ED with AMS, admitted with non-traumatic rhabdomyolysis and Covid-19 infection  PMH significant for hypertension and dyslipidemia as well as dementia,  Clinical Impression  Patient admitted with the above. Unable to obtain home setup or PLOF due to patient being poor historian with hx of dementia. Inconsistent one step command following throughout session. Oriented to self but not birth date. Patient presents with weakness, impaired balance, and decreased activity tolerance. Cognition also a factor in current functional state and safety. Required modA+2 for bed mobility and minA+2 for sit to stand with HHAx2. Able to take a few lateral steps up to [facility] before reporting dizziness and returning to sitting then supine. Patient will benefit from skilled PT services during acute stay to address listed deficits. Patient will benefit from ongoing therapy at discharge to maximize functional independence and safety.        If plan is discharge home, recommend the following: A lot of help with walking and/or transfers;A lot of help with bathing/dressing/bathroom;Assistance with cooking/housework;Direct supervision/assist for medications management;Direct supervision/assist for financial management;Assist for transportation;Help with stairs or ramp for entrance;Supervision due to cognitive status   Can travel by private vehicle   Yes    Equipment Recommendations Rolling Analucia Hush (2 wheels)  Recommendations for Other Services       Functional Status Assessment Patient has had a recent decline in their functional status and demonstrates the ability to make significant improvements in function in a reasonable and predictable amount of time.     Precautions / Restrictions Precautions Precautions: Fall       Mobility  Bed Mobility Overal bed mobility: Needs Assistance Bed Mobility: Supine to Sit, Sit to Supine     Supine to sit: Mod assist, +2 for physical assistance, +2 for safety/equipment Sit to supine: Mod assist, +2 for safety/equipment, +2 for physical assistance        Transfers Overall transfer level: Needs assistance Equipment used: 2 person hand held assist Transfers: Sit to/from Stand Sit to Stand: Min assist, +2 physical assistance, +2 safety/equipment           General transfer comment: pt reports feeling "drunk" in standing, returned to supine    Ambulation/Gait                  Stairs            Wheelchair Mobility     Tilt Bed    Modified Rankin (Stroke Patients Only)       Balance Overall balance assessment: Needs assistance Sitting-balance support: Feet supported Sitting balance-Leahy Scale: Fair     Standing balance support: Bilateral upper extremity supported Standing balance-Leahy Scale: Poor Standing balance comment: three lateral steps up the bed with MIN A +2                             Pertinent Vitals/Pain Pain Assessment Pain Assessment: PAINAD Breathing: normal Negative Vocalization: occasional moan/groan, low speech, negative/disapproving quality Facial Expression: smiling or inexpressive Body Language: tense, distressed pacing, fidgeting Consolability: distracted or reassured by voice/touch PAINAD Score: 3 Pain Intervention(s): Monitored during session    Home Living Family/patient expects to be discharged to:: Private residence                   Additional  Comments: Pt is a poor historian, unable to provide PLOF or any home set up information; team updated    Prior Function Prior Level of Function : Patient poor historian/Family not available               ADLs Comments: pt is a poor historian, unable to provide PLOF     Extremity/Trunk Assessment   Upper Extremity  Assessment Upper Extremity Assessment: Defer to OT evaluation    Lower Extremity Assessment Lower Extremity Assessment: Generalized weakness       Communication   Communication Communication: Difficulty following commands/understanding;Difficulty communicating thoughts/reduced clarity of speech Following commands: Follows multi-step commands inconsistently Cueing Techniques: Verbal cues;Gestural cues;Tactile cues;Visual cues  Cognition Arousal: Lethargic Behavior During Therapy: Impulsive Overall Cognitive Status: Impaired/Different from baseline Area of Impairment: Orientation, Attention, Memory, Following commands, Safety/judgement, Awareness, Problem solving                 Orientation Level: Disoriented to, Place, Time, Situation (could not state birthday) Current Attention Level: Focused Memory: Decreased recall of precautions, Decreased short-term memory Following Commands: Follows one step commands inconsistently Safety/Judgement: Decreased awareness of safety, Decreased awareness of deficits Awareness: Intellectual Problem Solving: Slow processing, Decreased initiation, Difficulty sequencing, Requires verbal cues, Requires tactile cues General Comments: step by step multi modal cues for simple one step directions        General Comments General comments (skin integrity, edema, etc.): spo2 >90% pre/post mobility    Exercises     Assessment/Plan    PT Assessment Patient needs continued PT services  PT Problem List Decreased strength;Decreased activity tolerance;Decreased balance;Decreased mobility;Decreased cognition;Decreased knowledge of use of DME;Decreased knowledge of precautions;Decreased safety awareness;Cardiopulmonary status limiting activity       PT Treatment Interventions DME instruction;Gait training;Functional mobility training;Therapeutic activities;Therapeutic exercise;Balance training;Patient/family education    PT Goals (Current goals can be  found in the Care Plan section)  Acute Rehab PT Goals Patient Stated Goal: did not state PT Goal Formulation: Patient unable to participate in goal setting Time For Goal Achievement: 03/20/23 Potential to Achieve Goals: Fair    Frequency Min 1X/week     Co-evaluation PT/OT/SLP Co-Evaluation/Treatment: Yes Reason for Co-Treatment: For patient/therapist safety;Necessary to address cognition/behavior during functional activity PT goals addressed during session: Mobility/safety with mobility;Balance OT goals addressed during session: ADL's and self-care       AM-PAC PT "6 Clicks" Mobility  Outcome Measure Help needed turning from your back to your side while in a flat bed without using bedrails?: Total Help needed moving from lying on your back to sitting on the side of a flat bed without using bedrails?: Total Help needed moving to and from a bed to a chair (including a wheelchair)?: Total Help needed standing up from a chair using your arms (e.g., wheelchair or bedside chair)?: Total Help needed to walk in hospital room?: Total Help needed climbing 3-5 steps with a railing? : Total 6 Click Score: 6    End of Session   Activity Tolerance: Other (comment);Patient tolerated treatment well (limited by dizziness) Patient left: in bed;with call bell/phone within reach;with bed alarm set Nurse Communication: Mobility status PT Visit Diagnosis: Unsteadiness on feet (R26.81);Muscle weakness (generalized) (M62.81);Other abnormalities of gait and mobility (R26.89)    Time: 1610-9604 PT Time Calculation (min) (ACUTE ONLY): 16 min   Charges:   PT Evaluation $PT Eval Moderate Complexity: 1 Mod   PT General Charges $$ ACUTE PT VISIT: 1 Visit         Kresta Templeman  Otho Blitz PT, DPT Physical Therapist - [facility]   Guadalupe Nickless A Felix Pratt 03/06/2023, 12:30 PM

## 2023-03-06 NOTE — Assessment & Plan Note (Addendum)
 03-06-2023 Scr slightly above his baseline of 1.4-1.6 03-07-2023 Today, BUN 28, Scr 1.28. stable now. 03-08-2023 stable  03-09-2023 stable. Scr 1.12, BUN 29

## 2023-03-06 NOTE — Assessment & Plan Note (Addendum)
 03-06-2023 leukopenia and thrombocytopenia likely due to his covid-19. Pt was already anemia prior to admission. Pancytopenia not from any known cancer. 03-07-2023 stable. Will repeat CBC at discharge. 03-08-2023 stable 03-09-2023 resolved. has anemia. Plt count improved to 145K. WBC now 5.5

## 2023-03-06 NOTE — Subjective & Objective (Addendum)
Pt seen and examined. On RA. RN reported seeing dried blood on outside of left ear. There is no available otoscope on the floor. Start po augmentin. Likely acute otitis media. Will try and bring my own personal otoscope tomorrow.

## 2023-03-06 NOTE — Evaluation (Signed)
 Occupational Therapy Evaluation Patient Details Name: Drew Russell MRN: [id number] DOB: 03-30-1945 Today's Date: 03/06/2023   History of Present Illness Pt is a 78 year old male presented to the ED with AMS, admitted with non-traumatic rhabdomyolysis and Covid-19 infection  PMH significant for hypertension and dyslipidemia as well as dementia,   Clinical Impression   Chart reviewed to date, pt greeted in room, oriented to self only with mask incorrectly donned over face. Inconsistent one step direction following throughout. Pt presents with deficits in strength, endurance, activity tolerance, balance, cognition affecting safe and optimal ADL completion. Bed mobility completed with MOD A +2, STS with MIN A +2 via HHA, lateral steps up the bed to the R with MIN A +2 via HHA. Pt reports dizziness, returned to supine. MIN A required for grooming tasks, MAX A for LB dressing. Step by step multi modal cues required throughout for simple directives. Poor safety awareness throughout. Pt is left in bed, all needs met, safety maintained. OT will follow acutely.       If plan is discharge home, recommend the following: A lot of help with walking and/or transfers;A lot of help with bathing/dressing/bathroom;Assistance with cooking/housework;Direct supervision/assist for medications management;Assist for transportation;Supervision due to cognitive status;Help with stairs or ramp for entrance;Direct supervision/assist for financial management    Functional Status Assessment  Patient has had a recent decline in their functional status and demonstrates the ability to make significant improvements in function in a reasonable and predictable amount of time.  Equipment Recommendations  Other (comment) (defer to next venue of care)    Recommendations for Other Services       Precautions / Restrictions Precautions Precautions: Fall      Mobility Bed Mobility Overal bed mobility: Needs Assistance Bed  Mobility: Supine to Sit, Sit to Supine     Supine to sit: Mod assist, +2 for physical assistance, +2 for safety/equipment Sit to supine: Mod assist, +2 for safety/equipment, +2 for physical assistance        Transfers Overall transfer level: Needs assistance Equipment used: 2 person hand held assist Transfers: Sit to/from Stand Sit to Stand: Min assist, +2 physical assistance, +2 safety/equipment           General transfer comment: pt reports feeling "drunk" in standing, returned to supine      Balance Overall balance assessment: Needs assistance Sitting-balance support: Feet supported Sitting balance-Leahy Scale: Fair     Standing balance support: Bilateral upper extremity supported Standing balance-Leahy Scale: Poor Standing balance comment: three lateral steps up the bed with MIN A +2                           ADL either performed or assessed with clinical judgement   ADL Overall ADL's : Needs assistance/impaired     Grooming: Wash/dry face;Sitting;Minimal assistance Grooming Details (indicate cue type and reason): bed level, step by step vcs for thoroughness             Lower Body Dressing: Maximal assistance Lower Body Dressing Details (indicate cue type and reason): donn doff socks     Toileting- Clothing Manipulation and Hygiene: Maximal assistance         General ADL Comments: current level of cognition is affecting optimal ADL participation on this date     Vision Baseline Vision/History: 1 Wears glasses Additional Comments: will continue to assess     Perception         Praxis  Pertinent Vitals/Pain Pain Assessment Pain Assessment: PAINAD Breathing: normal Negative Vocalization: occasional moan/groan, low speech, negative/disapproving quality Facial Expression: smiling or inexpressive Body Language: tense, distressed pacing, fidgeting Consolability: distracted or reassured by voice/touch PAINAD Score: 3 Pain  Intervention(s): Monitored during session, Repositioned     Extremity/Trunk Assessment Upper Extremity Assessment Upper Extremity Assessment: Generalized weakness;Difficult to assess due to impaired cognition (will continue to assess)   Lower Extremity Assessment Lower Extremity Assessment: Generalized weakness;Difficult to assess due to impaired cognition       Communication Communication Communication: Difficulty following commands/understanding;Difficulty communicating thoughts/reduced clarity of speech Following commands: Follows multi-step commands inconsistently Cueing Techniques: Verbal cues;Gestural cues;Tactile cues;Visual cues   Cognition Arousal: Lethargic Behavior During Therapy: Impulsive Overall Cognitive Status: Impaired/Different from baseline Area of Impairment: Orientation, Attention, Memory, Following commands, Safety/judgement, Awareness, Problem solving                 Orientation Level: Disoriented to, Place, Time, Situation (could not state birthday) Current Attention Level: Focused Memory: Decreased recall of precautions, Decreased short-term memory Following Commands: Follows one step commands inconsistently Safety/Judgement: Decreased awareness of safety, Decreased awareness of deficits Awareness: Intellectual Problem Solving: Slow processing, Decreased initiation, Difficulty sequencing, Requires verbal cues, Requires tactile cues General Comments: step by step multi modal cues for simple one step directions     General Comments  spo2 >90% pre/post mobility    Exercises Other Exercises Other Exercises: edu re: role of OT, role of rehab   Shoulder Instructions      Home Living Family/patient expects to be discharged to:: Private residence                                 Additional Comments: Pt is a poor historian, unable to provide PLOF or any home set up information; team updated      Prior Functioning/Environment Prior  Level of Function : Patient poor historian/Family not available               ADLs Comments: pt is a poor historian, unable to provide PLOF        OT Problem List: Decreased strength;Decreased activity tolerance;Impaired balance (sitting and/or standing);Decreased cognition;Decreased knowledge of use of DME or AE;Decreased safety awareness;Decreased knowledge of precautions      OT Treatment/Interventions: Self-care/ADL training;Therapeutic exercise;Patient/family education;Balance training;DME and/or AE instruction;Cognitive remediation/compensation;Therapeutic activities;Energy conservation    OT Goals(Current goals can be found in the care plan section) Acute Rehab OT Goals Patient Stated Goal: go back to bed OT Goal Formulation: With patient Time For Goal Achievement: 03/20/23 Potential to Achieve Goals: Good ADL Goals Pt Will Perform Grooming: with supervision;sitting Pt Will Perform Lower Body Dressing: with supervision;sit to/from stand;sitting/lateral leans Pt Will Transfer to Toilet: with supervision;ambulating Pt Will Perform Toileting - Clothing Manipulation and hygiene: with supervision;sitting/lateral leans;sit to/from stand  OT Frequency: Min 1X/week    Co-evaluation PT/OT/SLP Co-Evaluation/Treatment: Yes Reason for Co-Treatment: For patient/therapist safety;Necessary to address cognition/behavior during functional activity   OT goals addressed during session: ADL's and self-care      AM-PAC OT "6 Clicks" Daily Activity     Outcome Measure Help from another person eating meals?: A Little Help from another person taking care of personal grooming?: A Lot Help from another person toileting, which includes using toliet, bedpan, or urinal?: A Lot Help from another person bathing (including washing, rinsing, drying)?: A Lot Help from another person to put on and taking off regular  upper body clothing?: A Lot Help from another person to put on and taking off regular  lower body clothing?: A Lot 6 Click Score: 13   End of Session Nurse Communication: Mobility status  Activity Tolerance: Patient tolerated treatment well Patient left: in bed;with call bell/phone within reach;with bed alarm set  OT Visit Diagnosis: Other abnormalities of gait and mobility (R26.89);Muscle weakness (generalized) (M62.81)                Time: 7253-6644 OT Time Calculation (min): 16 min Charges:  OT General Charges $OT Visit: 1 Visit OT Evaluation $OT Eval Moderate Complexity: 1 Mod  Gerre Kraft, OTD OTR/L  03/06/23, 12:15 PM

## 2023-03-06 NOTE — Hospital Course (Signed)
 HPI: Drew Russell is a 78 y.o. Caucasian male with medical history significant for hypertension and dyslipidemia as well as dementia, presented to the emergency room with acute onset of altered mental status and fall.  Apparently fell at home yesterday and his brother was feeding him while he was on the floor waiting for him to get enough energy to get up.  He denied any chest pain or palpitations earlier.  No cough or wheezing or dyspnea.  No neck pain or stiffness.  No fever or chills.  He depleted and dehydrated with cracked lips and dry mucous membranes.  he was very somnolent during my interview and only responsive to painful stimuli and therefore no further history could be obtained.   Significant Events: Admitted 03/04/2023 non-traumatic rhabdomyolysis and Covid-19 infection   Significant Labs: BUN of 34 with a creatinine of 1.45 close to previous levels. Calcium  was 8.7 and AST was 61 with total bili 1.6 with otherwise unremarkable CMP. Total CK was 1041 and high sensitive troponin was 36. CBC showed anemia with hemoglobin of 12.3 and hematocrit 36.3 better than previous levels and thrombocytopenia of 143 compared to 185 in October last year. Lactic acid is 1.2. COVID-19 PCR was POSITIVE and the rest of the respiratory panel came back negative.   Significant Imaging Studies: Noncontrasted head CT scan revealed no acute ventricular abnormalities.  Portable chest x-ray showed no acute cardiopulmonary disease.   Antibiotic Therapy: Anti-infectives (From admission, onward)    Start     Dose/Rate Route Frequency Ordered Stop   03/05/23 0130  vancomycin  (VANCOREADY) IVPB 1250 mg/250 mL        1,250 mg 166.7 mL/hr over 90 Minutes Intravenous  Once 03/05/23 0127 03/05/23 0311   03/05/23 0015  ceFEPIme  (MAXIPIME ) 2 g in sodium chloride  0.9 % 100 mL IVPB        2 g 200 mL/hr over 30 Minutes Intravenous  Once 03/05/23 0009 03/05/23 0128       Procedures:   Consultants:

## 2023-03-07 DIAGNOSIS — M6282 Rhabdomyolysis: Secondary | ICD-10-CM | POA: Diagnosis not present

## 2023-03-07 DIAGNOSIS — U071 COVID-19: Secondary | ICD-10-CM | POA: Diagnosis not present

## 2023-03-07 DIAGNOSIS — E861 Hypovolemia: Secondary | ICD-10-CM | POA: Diagnosis not present

## 2023-03-07 DIAGNOSIS — G9341 Metabolic encephalopathy: Secondary | ICD-10-CM | POA: Diagnosis not present

## 2023-03-07 LAB — CK: Total CK: 478 U/L — ABNORMAL HIGH (ref 49–397)

## 2023-03-07 LAB — COMPREHENSIVE METABOLIC PANEL
ALT: 24 U/L (ref 0–44)
AST: 59 U/L — ABNORMAL HIGH (ref 15–41)
Albumin: 3.1 g/dL — ABNORMAL LOW (ref 3.5–5.0)
Alkaline Phosphatase: 42 U/L (ref 38–126)
Anion gap: 8 (ref 5–15)
BUN: 28 mg/dL — ABNORMAL HIGH (ref 8–23)
CO2: 27 mmol/L (ref 22–32)
Calcium: 8.1 mg/dL — ABNORMAL LOW (ref 8.9–10.3)
Chloride: 106 mmol/L (ref 98–111)
Creatinine, Ser: 1.28 mg/dL — ABNORMAL HIGH (ref 0.61–1.24)
GFR, Estimated: 57 mL/min — ABNORMAL LOW (ref >60)
Glucose, Bld: 95 mg/dL (ref 70–99)
Potassium: 3.3 mmol/L — ABNORMAL LOW (ref 3.5–5.1)
Sodium: 141 mmol/L (ref 135–145)
Total Bilirubin: 1 mg/dL (ref 0.0–1.2)
Total Protein: 5.7 g/dL — ABNORMAL LOW (ref 6.5–8.1)

## 2023-03-07 NOTE — Progress Notes (Addendum)
PROGRESS NOTE    Drew Russell  ZOX:[id number] DOB: 1945-05-12 DOA: 03/04/2023 PCP: Larae Grooms, NP  Subjective: Pt seen and examined. Pt remains confused Pt has no teeth. Unable to eat regular texture food.  Will change to chopped diet. Remains on RA. CK down to 478 BUN 28, Scr 1.28   Hospital Course: HPI: Drew Russell is a 78 y.o. Caucasian male with medical history significant for hypertension and dyslipidemia as well as dementia, presented to the emergency room with acute onset of altered mental status and fall.  Apparently fell at home yesterday and his brother was feeding him while he was on the floor waiting for him to get enough energy to get up.  He denied any chest pain or palpitations earlier.  No cough or wheezing or dyspnea.  No neck pain or stiffness.  No fever or chills.  He depleted and dehydrated with cracked lips and dry mucous membranes.  he was very somnolent during my interview and only responsive to painful stimuli and therefore no further history could be obtained.   Significant Events: Admitted 03/04/2023 non-traumatic rhabdomyolysis and Covid-19 infection   Significant Labs: BUN of 34 with a creatinine of 1.45 close to previous levels. Calcium was 8.7 and AST was 61 with total bili 1.6 with otherwise unremarkable CMP. Total CK was 1041 and high sensitive troponin was 36. CBC showed anemia with hemoglobin of 12.3 and hematocrit 36.3 better than previous levels and thrombocytopenia of 143 compared to 185 in October last year. Lactic acid is 1.2. COVID-19 PCR was POSITIVE and the rest of the respiratory panel came back negative.   Significant Imaging Studies: Noncontrasted head CT scan revealed no acute ventricular abnormalities.  Portable chest x-ray showed no acute cardiopulmonary disease.   Antibiotic Therapy: Anti-infectives (From admission, onward)    Start     Dose/Rate Route Frequency Ordered Stop   03/05/23 0130  vancomycin (VANCOREADY) IVPB  1250 mg/250 mL        1,250 mg 166.7 mL/hr over 90 Minutes Intravenous  Once 03/05/23 0127 03/05/23 0311   03/05/23 0015  ceFEPIme (MAXIPIME) 2 g in sodium chloride 0.9 % 100 mL IVPB        2 g 200 mL/hr over 30 Minutes Intravenous  Once 03/05/23 0009 03/05/23 0128       Procedures:   Consultants:     Assessment and Plan: * Non-traumatic rhabdomyolysis 03-06-2023 CK has come down nicely. No further need for IVF. Can potentially go home if pt's family can be located and will take him back. Will need PT/OT assessment.  03-07-2023 CK down to 478. No need to continue checking it. Needs SNF placement.  COVID-19 virus infection 03-06-2023 continue supportive care. On RA.  03-07-2023 stable. On RA.  Acute metabolic encephalopathy 03-06-2023  This is likely associated with COVID-19 and his dementia together with acute rhabdomyolysis. UA negative for infection.  03-07-2023 resolved. Pt seems confused at baseline.  Dyslipidemia 03-06-2023 Will hold statin therapy given his rhabdomyolysis.  03-07-2023 stable. Restart statin at discharge.  Hypotension due to hypovolemia On admission, Likely due to lack po liquid intake. Pt is not septic. Stop midodrine. Monitor BP.  03-07-2023 resolved. BP stable.  Pancytopenia (HCC) 03-06-2023 leukopenia and thrombocytopenia likely due to his covid-19. Pt was already anemia prior to admission. Pancytopenia not from any known cancer.  03-07-2023 stable. Will repeat CBC at discharge.  Anemia in chronic kidney disease (CKD) 03-06-2023 chronic anemia.  03-07-2023 stable.  CKD stage 3a, GFR 45-59  ml/min (HCC) - baseline scR 1.4-1.6 03-06-2023 Scr slightly above his baseline of 1.4-1.6  03-07-2023 Today, BUN 28, Scr 1.28. stable now.   DVT prophylaxis: enoxaparin (LOVENOX) injection 40 mg Start: 03/05/23 0800    Code Status: Full Code Family Communication: no family at bedside Disposition Plan: SNF placement Reason for continuing need  for hospitalization: medically stable for DC.  Objective: Vitals:   03/06/23 1619 03/06/23 1630 03/06/23 2307 03/07/23 0921  BP: (!) 119/58  126/66 105/71  Pulse: 66  65 72  Resp: 16 16 18 16   Temp: 98.9 F (37.2 C)  98.8 F (37.1 C) 98.2 F (36.8 C)  TempSrc: Oral  Oral Oral  SpO2: 100%  98% 100%  Weight:       No intake or output data in the 24 hours ending 03/07/23 1536 Filed Weights   03/05/23 0023  Weight: 59 kg    Examination:  Physical Exam Vitals and nursing note reviewed.  Constitutional:      General: He is not in acute distress.    Appearance: He is not toxic-appearing or diaphoretic.  HENT:     Head: Normocephalic and atraumatic.     Nose: Nose normal.     Mouth/Throat:     Comments: edentulous Cardiovascular:     Rate and Rhythm: Normal rate and regular rhythm.  Pulmonary:     Effort: Pulmonary effort is normal.  Abdominal:     General: Bowel sounds are normal.     Palpations: Abdomen is soft.  Skin:    General: Skin is warm and dry.     Capillary Refill: Capillary refill takes less than 2 seconds.  Neurological:     Mental Status: He is disoriented.     Data Reviewed: I have personally reviewed following labs and imaging studies  CBC: Recent Labs  Lab 03/04/23 1551 03/05/23 0320  WBC 4.6 3.1*  NEUTROABS 3.5  --   HGB 12.3* 10.2*  HCT 36.3* 29.9*  MCV 97.1 97.1  PLT 143* 109*   Basic Metabolic Panel: Recent Labs  Lab 03/04/23 1551 03/05/23 0319 03/07/23 0524  NA 137 137 141  K 4.3 4.1 3.3*  CL 101 107 106  CO2 24 21* 27  GLUCOSE 93 96 95  BUN 34* 41* 28*  CREATININE 1.45* 1.75* 1.28*  CALCIUM 8.7* 7.6* 8.1*   GFR: Estimated Creatinine Clearance: 39.1 mL/min (A) (by C-G formula based on SCr of 1.28 mg/dL (H)). Liver Function Tests: Recent Labs  Lab 03/04/23 1551 03/07/23 0524  AST 61* 59*  ALT 23 24  ALKPHOS 52 42  BILITOT 1.6* 1.0  PROT 6.5 5.7*  ALBUMIN 3.8 3.1*   Cardiac Enzymes: Recent Labs  Lab  03/04/23 2154 03/05/23 0319 03/06/23 0241 03/07/23 0524  CKTOTAL 1,041* 795* 585* 478*   Anemia Panel: Recent Labs    03/05/23 0319  FERRITIN 824*   Sepsis Labs: Recent Labs  Lab 03/05/23 0017 03/05/23 0209 03/05/23 0319  PROCALCITON  --   --  <0.10  LATICACIDVEN 1.2 1.6  --     Recent Results (from the past 240 hours)  Blood Culture (routine x 2)     Status: None (Preliminary result)   Collection Time: 03/04/23 11:51 PM   Specimen: BLOOD  Result Value Ref Range Status   Specimen Description BLOOD LEFT ARM  Final   Special Requests   Final    BOTTLES DRAWN AEROBIC AND ANAEROBIC Blood Culture results may not be optimal due to an inadequate volume of blood  received in culture bottles   Culture   Final    NO GROWTH 2 DAYS Performed at [facility], [address] [place], [place] [address]    Report Status PENDING  Incomplete  Blood Culture (routine x 2)     Status: None (Preliminary result)   Collection Time: 03/05/23 12:17 AM   Specimen: BLOOD  Result Value Ref Range Status   Specimen Description BLOOD LEFT FOREARM  Final   Special Requests   Final    BOTTLES DRAWN AEROBIC AND ANAEROBIC Blood Culture adequate volume   Culture   Final    NO GROWTH 2 DAYS Performed at [facility], [address], [place], [place] [address]    Report Status PENDING  Incomplete  Resp panel by RT-PCR (RSV, Flu A&B, Covid) Anterior Nasal Swab     Status: Abnormal   Collection Time: 03/05/23 12:17 AM   Specimen: Anterior Nasal Swab  Result Value Ref Range Status   SARS Coronavirus 2 by RT PCR POSITIVE (A) NEGATIVE Final    Comment: (NOTE) SARS-CoV-2 target nucleic acids are DETECTED.  The SARS-CoV-2 RNA is generally detectable in upper respiratory specimens during the acute phase of infection. Positive results are indicative of the presence of the identified virus, but do not rule out bacterial infection or co-infection with other pathogens not detected by the  test. Clinical correlation with patient history and other diagnostic information is necessary to determine patient infection status. The expected result is Negative.  Fact Sheet for Patients: BloggerCourse.com  Fact Sheet for Healthcare Providers: SeriousBroker.it  This test is not yet approved or cleared by the [place] FDA and  has been authorized for detection and/or diagnosis of SARS-CoV-2 by FDA under an Emergency Use Authorization (EUA).  This EUA will remain in effect (meaning this test can be used) for the duration of  the COVID-19 declaration under Section 564(b)(1) of the A ct, 21 U.S.C. section 360bbb-3(b)(1), unless the authorization is terminated or revoked sooner.     Influenza A by PCR NEGATIVE NEGATIVE Final   Influenza B by PCR NEGATIVE NEGATIVE Final    Comment: (NOTE) The Xpert Xpress SARS-CoV-2/FLU/RSV plus assay is intended as an aid in the diagnosis of influenza from Nasopharyngeal swab specimens and should not be used as a sole basis for treatment. Nasal washings and aspirates are unacceptable for Xpert Xpress SARS-CoV-2/FLU/RSV testing.  Fact Sheet for Patients: BloggerCourse.com  Fact Sheet for Healthcare Providers: SeriousBroker.it  This test is not yet approved or cleared by the [place] FDA and has been authorized for detection and/or diagnosis of SARS-CoV-2 by FDA under an Emergency Use Authorization (EUA). This EUA will remain in effect (meaning this test can be used) for the duration of the COVID-19 declaration under Section 564(b)(1) of the Act, 21 U.S.C. section 360bbb-3(b)(1), unless the authorization is terminated or revoked.     Resp Syncytial Virus by PCR NEGATIVE NEGATIVE Final    Comment: (NOTE) Fact Sheet for Patients: BloggerCourse.com  Fact Sheet for Healthcare  Providers: SeriousBroker.it  This test is not yet approved or cleared by the [place] FDA and has been authorized for detection and/or diagnosis of SARS-CoV-2 by FDA under an Emergency Use Authorization (EUA). This EUA will remain in effect (meaning this test can be used) for the duration of the COVID-19 declaration under Section 564(b)(1) of the Act, 21 U.S.C. section 360bbb-3(b)(1), unless the authorization is terminated or revoked.  Performed at [facility], [address], [place], [place]  [address]      Radiology Studies: No results found.  Scheduled Meds:  vitamin C  1,000 mg Oral Daily   enoxaparin (LOVENOX) injection  40 mg Subcutaneous Q24H   Continuous Infusions:   LOS: 2 days   Time spent: 40 minutes  Carollee Herter, DO  [facility]  03/07/2023, 3:36 PM

## 2023-03-07 NOTE — NC FL2 (Signed)
[place] MEDICAID FL2 LEVEL OF CARE FORM     IDENTIFICATION  Patient Name: Drew Russell Birthdate: February 01, 1946 Sex: male Admission Date (Current Location): 03/04/2023  [facility] and [place] Number:  Chiropodist and Address:  [facility], [address] [place], [place] [address]      Provider Number: [id number]  Attending Physician Name and Address:  Carollee Herter, DO  Relative Name and Phone Number:  Adella Nissen  Sister  Emergency Contact  [phone]    Current Level of Care: Hospital Recommended Level of Care: Skilled Nursing Facility Prior Approval Number:    Date Approved/Denied:   PASRR Number: [id number]  Discharge Plan: SNF    Current Diagnoses: Patient Active Problem List   Diagnosis Date Noted   Pancytopenia (HCC) 03/06/2023   Non-traumatic rhabdomyolysis 03/05/2023   Hypotension due to hypovolemia 03/05/2023   COVID-19 virus infection 03/05/2023   Dyslipidemia 03/05/2023   Acute metabolic encephalopathy 03/05/2023   Anemia in chronic kidney disease (CKD) 12/03/2022   Advanced care planning/counseling discussion 12/03/2022   CKD stage 3a, GFR 45-59 ml/min (HCC) - baseline scR 1.4-1.6 07/25/2021   Emphysema lung (HCC) 05/06/2017   Aortic atherosclerosis (HCC) 04/27/2016   Coronary artery disease 09/16/2014   Essential (primary) hypertension 09/16/2014   Heartburn 09/16/2014   Hypertriglyceridemia 09/16/2014   History of smoking 30 or more pack years 09/16/2014    Orientation RESPIRATION BLADDER Height & Weight     Self, Place  Normal Continent Weight: 59 kg Height:     BEHAVIORAL SYMPTOMS/MOOD NEUROLOGICAL BOWEL NUTRITION STATUS      Continent Diet (See dc summary)  AMBULATORY STATUS COMMUNICATION OF NEEDS Skin   Extensive Assist Verbally Normal                       Personal Care Assistance Level of Assistance  Bathing, Feeding, Dressing Bathing Assistance: Limited assistance Feeding assistance:  Limited assistance Dressing Assistance: Limited assistance     Functional Limitations Info  Sight, Hearing Sight Info: Adequate (glasses) Hearing Info: Adequate      SPECIAL CARE FACTORS FREQUENCY  PT (By licensed PT), OT (By licensed OT)     PT Frequency: 5 times per week OT Frequency: 5 times per week            Contractures Contractures Info: Not present    Additional Factors Info  Code Status, Allergies Code Status Info: full code Allergies Info: NKDA           Current Medications (03/07/2023):  This is the current hospital active medication list Current Facility-Administered Medications  Medication Dose Route Frequency Provider Last Rate Last Admin   acetaminophen (TYLENOL) tablet 650 mg  650 mg Oral Q6H PRN Mansy, Jan A, MD       Or   acetaminophen (TYLENOL) suppository 650 mg  650 mg Rectal Q6H PRN Mansy, Jan A, MD       ascorbic acid (VITAMIN C) tablet 1,000 mg  1,000 mg Oral Daily Mansy, Jan A, MD   1,000 mg at 03/07/23 0942   enoxaparin (LOVENOX) injection 40 mg  40 mg Subcutaneous Q24H Mansy, Jan A, MD   40 mg at 03/07/23 0942   magnesium hydroxide (MILK OF MAGNESIA) suspension 30 mL  30 mL Oral Daily PRN Mansy, Jan A, MD       ondansetron [facility]) tablet 4 mg  4 mg Oral Q6H PRN Mansy, Vernetta Honey, MD       Or  ondansetron (ZOFRAN) injection 4 mg  4 mg Intravenous Q6H PRN Mansy, Jan A, MD       traZODone (DESYREL) tablet 25 mg  25 mg Oral QHS PRN Mansy, Vernetta Honey, MD         Discharge Medications: Please see discharge summary for a list of discharge medications.  Relevant Imaging Results:  Relevant Lab Results:   Additional Information SS# [id number]  Marlowe Sax, RN

## 2023-03-07 NOTE — Plan of Care (Signed)
  Problem: Education: Goal: Knowledge of General Education information will improve Description: Including pain rating scale, medication(s)/side effects and non-pharmacologic comfort measures Outcome: Progressing   Problem: Nutrition: Goal: Adequate nutrition will be maintained Outcome: Progressing   Problem: Safety: Goal: Ability to remain free from injury will improve Outcome: Progressing   

## 2023-03-07 NOTE — Plan of Care (Signed)
  Problem: Education: Goal: Knowledge of risk factors and measures for prevention of condition will improve Outcome: Progressing   Problem: Coping: Goal: Psychosocial and spiritual needs will be supported Outcome: Progressing   Problem: Respiratory: Goal: Will maintain a patent airway Outcome: Progressing Goal: Complications related to the disease process, condition or treatment will be avoided or minimized Outcome: Progressing   

## 2023-03-08 DIAGNOSIS — U071 COVID-19: Secondary | ICD-10-CM | POA: Diagnosis not present

## 2023-03-08 DIAGNOSIS — M6282 Rhabdomyolysis: Secondary | ICD-10-CM | POA: Diagnosis not present

## 2023-03-08 DIAGNOSIS — N1831 Chronic kidney disease, stage 3a: Secondary | ICD-10-CM | POA: Diagnosis not present

## 2023-03-08 DIAGNOSIS — D61818 Other pancytopenia: Secondary | ICD-10-CM | POA: Diagnosis not present

## 2023-03-08 LAB — CK: Total CK: 224 U/L (ref 49–397)

## 2023-03-08 NOTE — Care Management Important Message (Signed)
Important Message  Patient Details  Name: Drew Russell MRN: [id number] Date of Birth: May 23, 1945   Important Message Given:  Yes - Medicare IM     Cristela Blue, CMA 03/08/2023, 11:55 AM

## 2023-03-08 NOTE — TOC Progression Note (Signed)
Transition of Care [facility]) - Progression Note    Patient Details  Name: Drew Russell MRN: [id number] Date of Birth: 1946/01/11  Transition of Care [facility]) CM/SW Contact  Marlowe Sax, RN Phone Number: 03/08/2023, 2:34 PM  Clinical Narrative:    No bed offers at this time, Resent the bedsearch        Expected Discharge Plan and Services                                               Social Determinants of Health (SDOH) Interventions SDOH Screenings   Food Insecurity: Patient Unable To Answer (03/08/2023)  Housing: Patient Unable To Answer (03/08/2023)  Transportation Needs: Patient Unable To Answer (03/08/2023)  Utilities: Patient Unable To Answer (03/08/2023)  Alcohol Screen: Low Risk  (08/08/2021)  Depression (PHQ2-9): Low Risk  (12/03/2022)  Financial Resource Strain: Low Risk  (08/08/2021)  Physical Activity: Insufficiently Active (08/08/2021)  Social Connections: Unknown (03/08/2023)  Stress: No Stress Concern Present (08/08/2021)  Tobacco Use: Medium Risk (12/03/2022)    Readmission Risk Interventions     No data to display

## 2023-03-08 NOTE — Plan of Care (Signed)

## 2023-03-08 NOTE — Plan of Care (Signed)
  Problem: Education: Goal: Knowledge of risk factors and measures for prevention of condition will improve Outcome: Progressing   Problem: Coping: Goal: Psychosocial and spiritual needs will be supported Outcome: Progressing   

## 2023-03-08 NOTE — Progress Notes (Signed)
PROGRESS NOTE    Drew Russell  UXL:[id number] DOB: 12/10/1945 DOA: 03/04/2023 PCP: Larae Grooms, NP  Subjective: Pt seen and examined. On RA. Not eating much Awaiting SNF placement.    Hospital Course: HPI: Drew Russell is a 78 y.o. Caucasian male with medical history significant for hypertension and dyslipidemia as well as dementia, presented to the emergency room with acute onset of altered mental status and fall.  Apparently fell at home yesterday and his brother was feeding him while he was on the floor waiting for him to get enough energy to get up.  He denied any chest pain or palpitations earlier.  No cough or wheezing or dyspnea.  No neck pain or stiffness.  No fever or chills.  He depleted and dehydrated with cracked lips and dry mucous membranes.  he was very somnolent during my interview and only responsive to painful stimuli and therefore no further history could be obtained.   Significant Events: Admitted 03/04/2023 non-traumatic rhabdomyolysis and Covid-19 infection   Significant Labs: BUN of 34 with a creatinine of 1.45 close to previous levels. Calcium was 8.7 and AST was 61 with total bili 1.6 with otherwise unremarkable CMP. Total CK was 1041 and high sensitive troponin was 36. CBC showed anemia with hemoglobin of 12.3 and hematocrit 36.3 better than previous levels and thrombocytopenia of 143 compared to 185 in October last year. Lactic acid is 1.2. COVID-19 PCR was POSITIVE and the rest of the respiratory panel came back negative.   Significant Imaging Studies: Noncontrasted head CT scan revealed no acute ventricular abnormalities.  Portable chest x-ray showed no acute cardiopulmonary disease.   Antibiotic Therapy: Anti-infectives (From admission, onward)    Start     Dose/Rate Route Frequency Ordered Stop   03/05/23 0130  vancomycin (VANCOREADY) IVPB 1250 mg/250 mL        1,250 mg 166.7 mL/hr over 90 Minutes Intravenous  Once 03/05/23 0127 03/05/23 0311    03/05/23 0015  ceFEPIme (MAXIPIME) 2 g in sodium chloride 0.9 % 100 mL IVPB        2 g 200 mL/hr over 30 Minutes Intravenous  Once 03/05/23 0009 03/05/23 0128       Procedures:   Consultants:     Assessment and Plan: * Non-traumatic rhabdomyolysis 03-06-2023 CK has come down nicely. No further need for IVF. Can potentially go home if pt's family can be located and will take him back. Will need PT/OT assessment. 03-07-2023 CK down to 478. No need to continue checking it. Needs SNF placement.  03-08-2023 resolved.  COVID-19 virus infection 03-06-2023 continue supportive care. On RA. 03-07-2023 stable. On RA.  03-08-2023 stable. On RA.  Acute metabolic encephalopathy 03-06-2023  This is likely associated with COVID-19 and his dementia together with acute rhabdomyolysis. UA negative for infection. 03-07-2023 resolved. Pt seems confused at baseline.  03-08-2023 resolved  Dyslipidemia 03-06-2023 Will hold statin therapy given his rhabdomyolysis. 03-07-2023 stable. Restart statin at discharge.  03-08-2023 stable  Hypotension due to hypovolemia On admission, Likely due to lack po liquid intake. Pt is not septic. Stop midodrine. Monitor BP. 03-07-2023 resolved. BP stable.  03-08-2023 resolved.  Pancytopenia (HCC) 03-06-2023 leukopenia and thrombocytopenia likely due to his covid-19. Pt was already anemia prior to admission. Pancytopenia not from any known cancer. 03-07-2023 stable. Will repeat CBC at discharge.  03-08-2023 stable  Anemia in chronic kidney disease (CKD) 03-06-2023 chronic anemia. 03-07-2023 stable.  03-08-2023 stable  CKD stage 3a, GFR 45-59 ml/min (HCC) - baseline scR  1.4-1.6 03-06-2023 Scr slightly above his baseline of 1.4-1.6 03-07-2023 Today, BUN 28, Scr 1.28. stable now.  03-08-2023 stable   DVT prophylaxis: enoxaparin (LOVENOX) injection 40 mg Start: 03/05/23 0800     Code Status: Full Code Family Communication: no family at  bedside Disposition Plan: SNF Reason for continuing need for hospitalization: medically stable for DC  Objective: Vitals:   03/06/23 1630 03/06/23 2307 03/07/23 0921 03/08/23 0859  BP:  126/66 105/71 132/71  Pulse:  65 72 73  Resp: 16 18 16 16   Temp:  98.8 F (37.1 C) 98.2 F (36.8 C) 97.9 F (36.6 C)  TempSrc:  Oral Oral   SpO2:  98% 100% 98%  Weight:       No intake or output data in the 24 hours ending 03/08/23 1438 Filed Weights   03/05/23 0023  Weight: 59 kg    Examination:  Physical Exam Vitals and nursing note reviewed.  Constitutional:      General: He is not in acute distress.    Appearance: He is not toxic-appearing or diaphoretic.  HENT:     Head: Normocephalic and atraumatic.     Nose: Nose normal.     Mouth/Throat:     Comments: edentulous Cardiovascular:     Rate and Rhythm: Normal rate and regular rhythm.  Pulmonary:     Effort: Pulmonary effort is normal.  Abdominal:     General: Bowel sounds are normal.     Palpations: Abdomen is soft.  Skin:    General: Skin is warm and dry.     Capillary Refill: Capillary refill takes less than 2 seconds.  Neurological:     Mental Status: He is disoriented.     Data Reviewed: I have personally reviewed following labs and imaging studies  CBC: Recent Labs  Lab 03/04/23 1551 03/05/23 0320  WBC 4.6 3.1*  NEUTROABS 3.5  --   HGB 12.3* 10.2*  HCT 36.3* 29.9*  MCV 97.1 97.1  PLT 143* 109*   Basic Metabolic Panel: Recent Labs  Lab 03/04/23 1551 03/05/23 0319 03/07/23 0524  NA 137 137 141  K 4.3 4.1 3.3*  CL 101 107 106  CO2 24 21* 27  GLUCOSE 93 96 95  BUN 34* 41* 28*  CREATININE 1.45* 1.75* 1.28*  CALCIUM 8.7* 7.6* 8.1*   GFR: Estimated Creatinine Clearance: 39.1 mL/min (A) (by C-G formula based on SCr of 1.28 mg/dL (H)). Liver Function Tests: Recent Labs  Lab 03/04/23 1551 03/07/23 0524  AST 61* 59*  ALT 23 24  ALKPHOS 52 42  BILITOT 1.6* 1.0  PROT 6.5 5.7*  ALBUMIN 3.8 3.1*    Cardiac Enzymes: Recent Labs  Lab 03/04/23 2154 03/05/23 0319 03/06/23 0241 03/07/23 0524 03/08/23 0752  CKTOTAL 1,041* 795* 585* 478* 224   Sepsis Labs: Recent Labs  Lab 03/05/23 0017 03/05/23 0209 03/05/23 0319  PROCALCITON  --   --  <0.10  LATICACIDVEN 1.2 1.6  --     Recent Results (from the past 240 hours)  Blood Culture (routine x 2)     Status: None (Preliminary result)   Collection Time: 03/04/23 11:51 PM   Specimen: BLOOD  Result Value Ref Range Status   Specimen Description BLOOD LEFT ARM  Final   Special Requests   Final    BOTTLES DRAWN AEROBIC AND ANAEROBIC Blood Culture results may not be optimal due to an inadequate volume of blood received in culture bottles   Culture   Final    NO GROWTH  3 DAYS Performed at [facility], [address] [place], [place] [address]    Report Status PENDING  Incomplete  Blood Culture (routine x 2)     Status: None (Preliminary result)   Collection Time: 03/05/23 12:17 AM   Specimen: BLOOD  Result Value Ref Range Status   Specimen Description BLOOD LEFT FOREARM  Final   Special Requests   Final    BOTTLES DRAWN AEROBIC AND ANAEROBIC Blood Culture adequate volume   Culture   Final    NO GROWTH 3 DAYS Performed at [facility], [address] [place], [place] [address]    Report Status PENDING  Incomplete  Resp panel by RT-PCR (RSV, Flu A&B, Covid) Anterior Nasal Swab     Status: Abnormal   Collection Time: 03/05/23 12:17 AM   Specimen: Anterior Nasal Swab  Result Value Ref Range Status   SARS Coronavirus 2 by RT PCR POSITIVE (A) NEGATIVE Final    Comment: (NOTE) SARS-CoV-2 target nucleic acids are DETECTED.  The SARS-CoV-2 RNA is generally detectable in upper respiratory specimens during the acute phase of infection. Positive results are indicative of the presence of the identified virus, but do not rule out bacterial infection or co-infection with other pathogens not detected by the test.  Clinical correlation with patient history and other diagnostic information is necessary to determine patient infection status. The expected result is Negative.  Fact Sheet for Patients: BloggerCourse.com  Fact Sheet for Healthcare Providers: SeriousBroker.it  This test is not yet approved or cleared by the [place] FDA and  has been authorized for detection and/or diagnosis of SARS-CoV-2 by FDA under an Emergency Use Authorization (EUA).  This EUA will remain in effect (meaning this test can be used) for the duration of  the COVID-19 declaration under Section 564(b)(1) of the A ct, 21 U.S.C. section 360bbb-3(b)(1), unless the authorization is terminated or revoked sooner.     Influenza A by PCR NEGATIVE NEGATIVE Final   Influenza B by PCR NEGATIVE NEGATIVE Final    Comment: (NOTE) The Xpert Xpress SARS-CoV-2/FLU/RSV plus assay is intended as an aid in the diagnosis of influenza from Nasopharyngeal swab specimens and should not be used as a sole basis for treatment. Nasal washings and aspirates are unacceptable for Xpert Xpress SARS-CoV-2/FLU/RSV testing.  Fact Sheet for Patients: BloggerCourse.com  Fact Sheet for Healthcare Providers: SeriousBroker.it  This test is not yet approved or cleared by the [place] FDA and has been authorized for detection and/or diagnosis of SARS-CoV-2 by FDA under an Emergency Use Authorization (EUA). This EUA will remain in effect (meaning this test can be used) for the duration of the COVID-19 declaration under Section 564(b)(1) of the Act, 21 U.S.C. section 360bbb-3(b)(1), unless the authorization is terminated or revoked.     Resp Syncytial Virus by PCR NEGATIVE NEGATIVE Final    Comment: (NOTE) Fact Sheet for Patients: BloggerCourse.com  Fact Sheet for Healthcare  Providers: SeriousBroker.it  This test is not yet approved or cleared by the [place] FDA and has been authorized for detection and/or diagnosis of SARS-CoV-2 by FDA under an Emergency Use Authorization (EUA). This EUA will remain in effect (meaning this test can be used) for the duration of the COVID-19 declaration under Section 564(b)(1) of the Act, 21 U.S.C. section 360bbb-3(b)(1), unless the authorization is terminated or revoked.  Performed at [facility], [address], [place], [place] [address]      Radiology Studies: No results found.  Scheduled Meds:  vitamin C  1,000 mg Oral Daily   enoxaparin (LOVENOX) injection  40 mg Subcutaneous Q24H   Continuous Infusions:   LOS: 3 days   Time spent: 35 minutes  Carollee Herter, DO  [facility]  03/08/2023, 2:38 PM

## 2023-03-09 DIAGNOSIS — E876 Hypokalemia: Secondary | ICD-10-CM | POA: Diagnosis not present

## 2023-03-09 DIAGNOSIS — H66002 Acute suppurative otitis media without spontaneous rupture of ear drum, left ear: Secondary | ICD-10-CM

## 2023-03-09 DIAGNOSIS — N1831 Chronic kidney disease, stage 3a: Secondary | ICD-10-CM | POA: Diagnosis not present

## 2023-03-09 DIAGNOSIS — D631 Anemia in chronic kidney disease: Secondary | ICD-10-CM | POA: Diagnosis not present

## 2023-03-09 LAB — COMPREHENSIVE METABOLIC PANEL
ALT: 19 U/L (ref 0–44)
AST: 34 U/L (ref 15–41)
Albumin: 2.9 g/dL — ABNORMAL LOW (ref 3.5–5.0)
Alkaline Phosphatase: 39 U/L (ref 38–126)
Anion gap: 8 (ref 5–15)
BUN: 29 mg/dL — ABNORMAL HIGH (ref 8–23)
CO2: 26 mmol/L (ref 22–32)
Calcium: 8.1 mg/dL — ABNORMAL LOW (ref 8.9–10.3)
Chloride: 111 mmol/L (ref 98–111)
Creatinine, Ser: 1.12 mg/dL (ref 0.61–1.24)
GFR, Estimated: >60 mL/min (ref >60)
Glucose, Bld: 102 mg/dL — ABNORMAL HIGH (ref 70–99)
Potassium: 3.3 mmol/L — ABNORMAL LOW (ref 3.5–5.1)
Sodium: 145 mmol/L (ref 135–145)
Total Bilirubin: 1.4 mg/dL — ABNORMAL HIGH (ref 0.0–1.2)
Total Protein: 5.4 g/dL — ABNORMAL LOW (ref 6.5–8.1)

## 2023-03-09 LAB — CBC WITH DIFFERENTIAL/PLATELET
Abs Immature Granulocytes: 0.02 10*3/uL (ref 0.00–0.07)
Basophils Absolute: 0 10*3/uL (ref 0.0–0.1)
Basophils Relative: 0 %
Eosinophils Absolute: 0.1 10*3/uL (ref 0.0–0.5)
Eosinophils Relative: 2 %
HCT: 32.1 % — ABNORMAL LOW (ref 39.0–52.0)
Hemoglobin: 11.2 g/dL — ABNORMAL LOW (ref 13.0–17.0)
Immature Granulocytes: 0 %
Lymphocytes Relative: 15 %
Lymphs Abs: 0.8 10*3/uL (ref 0.7–4.0)
MCH: 33.4 pg (ref 26.0–34.0)
MCHC: 34.9 g/dL (ref 30.0–36.0)
MCV: 95.8 fL (ref 80.0–100.0)
Monocytes Absolute: 0.6 10*3/uL (ref 0.1–1.0)
Monocytes Relative: 11 %
Neutro Abs: 4 10*3/uL (ref 1.7–7.7)
Neutrophils Relative %: 72 %
Platelets: 145 10*3/uL — ABNORMAL LOW (ref 150–400)
RBC: 3.35 MIL/uL — ABNORMAL LOW (ref 4.22–5.81)
RDW: 14.5 % (ref 11.5–15.5)
WBC: 5.5 10*3/uL (ref 4.0–10.5)
nRBC: 1.8 % — ABNORMAL HIGH (ref 0.0–0.2)

## 2023-03-09 LAB — CK: Total CK: 140 U/L (ref 49–397)

## 2023-03-09 MED ORDER — POTASSIUM CHLORIDE CRYS ER 20 MEQ PO TBCR
40.0000 meq | EXTENDED_RELEASE_TABLET | ORAL | Status: AC
  Administered 2023-03-09 (×2): 40 meq via ORAL
  Filled 2023-03-09 (×2): qty 2

## 2023-03-09 MED ORDER — AMOXICILLIN-POT CLAVULANATE 875-125 MG PO TABS
1.0000 | ORAL_TABLET | Freq: Two times a day (BID) | ORAL | Status: AC
  Administered 2023-03-09 (×2): 1 via ORAL
  Filled 2023-03-09 (×9): qty 1

## 2023-03-09 NOTE — Progress Notes (Signed)
PROGRESS NOTE    Drew Russell  YNW:[id number] DOB: [birth year] DOA: 03/04/2023 PCP: Larae Grooms, NP  Subjective: Pt seen and examined. On RA. RN reported seeing dried blood on outside of left ear. There is no available otoscope on the floor. Start po augmentin. Likely acute otitis media. Will try and bring my own personal otoscope tomorrow.   Hospital Course: HPI: Drew Russell is a 78 y.o. Caucasian male with medical history significant for hypertension and dyslipidemia as well as dementia, presented to the emergency room with acute onset of altered mental status and fall.  Apparently fell at home yesterday and his brother was feeding him while he was on the floor waiting for him to get enough energy to get up.  He denied any chest pain or palpitations earlier.  No cough or wheezing or dyspnea.  No neck pain or stiffness.  No fever or chills.  He depleted and dehydrated with cracked lips and dry mucous membranes.  he was very somnolent during my interview and only responsive to painful stimuli and therefore no further history could be obtained.   Significant Events: Admitted 03/04/2023 non-traumatic rhabdomyolysis and Covid-19 infection   Significant Labs: BUN of 34 with a creatinine of 1.45 close to previous levels. Calcium was 8.7 and AST was 61 with total bili 1.6 with otherwise unremarkable CMP. Total CK was 1041 and high sensitive troponin was 36. CBC showed anemia with hemoglobin of 12.3 and hematocrit 36.3 better than previous levels and thrombocytopenia of 143 compared to 185 in October last year. Lactic acid is 1.2. COVID-19 PCR was POSITIVE and the rest of the respiratory panel came back negative.   Significant Imaging Studies: Noncontrasted head CT scan revealed no acute ventricular abnormalities.  Portable chest x-ray showed no acute cardiopulmonary disease.   Antibiotic Therapy: Anti-infectives (From admission, onward)    Start     Dose/Rate Route Frequency  Ordered Stop   03/05/23 0130  vancomycin (VANCOREADY) IVPB 1250 mg/250 mL        1,250 mg 166.7 mL/hr over 90 Minutes Intravenous  Once 03/05/23 0127 03/05/23 0311   03/05/23 0015  ceFEPIme (MAXIPIME) 2 g in sodium chloride 0.9 % 100 mL IVPB        2 g 200 mL/hr over 30 Minutes Intravenous  Once 03/05/23 0009 03/05/23 0128       Procedures:   Consultants:     Assessment and Plan: * Non-traumatic rhabdomyolysis 03-06-2023 CK has come down nicely. No further need for IVF. Can potentially go home if pt's family can be located and will take him back. Will need PT/OT assessment. 03-07-2023 CK down to 478. No need to continue checking it. Needs SNF placement.  03-08-2023 resolved.  Hypokalemia 03-09-2023 resolved with po kcl 40 meq x 2 doses.  Acute exudative otitis media of left ear 03-09-2023 start po augmentin 875 mg bid. Will try and bring my own personal otoscope from home as medical floor does not have an otoscope to use.  COVID-19 virus infection 03-06-2023 continue supportive care. On RA. 03-07-2023 stable. On RA. 03-08-2023 stable. On RA. 03-09-2023 stable.  Acute metabolic encephalopathy 03-06-2023  This is likely associated with COVID-19 and his dementia together with acute rhabdomyolysis. UA negative for infection. 03-07-2023 resolved. Pt seems confused at baseline.  03-08-2023 resolved  Dyslipidemia 03-06-2023 Will hold statin therapy given his rhabdomyolysis. 03-07-2023 stable. Restart statin at discharge. 03-08-2023 stable  03-09-2023 stable.   Hypotension due to hypovolemia On admission, Likely due to lack po  liquid intake. Pt is not septic. Stop midodrine. Monitor BP. 03-07-2023 resolved. BP stable.  03-08-2023 resolved.  Pancytopenia (HCC) 03-06-2023 leukopenia and thrombocytopenia likely due to his covid-19. Pt was already anemia prior to admission. Pancytopenia not from any known cancer. 03-07-2023 stable. Will repeat CBC at  discharge. 03-08-2023 stable 03-09-2023 resolved. has anemia. Plt count improved to 145K. WBC now 5.5  Anemia in chronic kidney disease (CKD) 03-06-2023 chronic anemia. 03-07-2023 stable. 03-08-2023 stable 03-09-2023 stable. HgB 11.2 g/dl  CKD stage 3a, GFR 21-30 ml/min (HCC) - baseline scR 1.4-1.6 03-06-2023 Scr slightly above his baseline of 1.4-1.6 03-07-2023 Today, BUN 28, Scr 1.28. stable now. 03-08-2023 stable  03-09-2023 stable. Scr 1.12, BUN 29   DVT prophylaxis: enoxaparin (LOVENOX) injection 40 mg Start: 03/05/23 0800    Code Status: Full Code Family Communication: no family at bedside Disposition Plan: SNF placement Reason for continuing need for hospitalization: medically stable for DC.  Objective: Vitals:   03/08/23 0859 03/08/23 1600 03/08/23 2312 03/09/23 0753  BP: 132/71 136/70 111/62 113/73  Pulse: 73 83 69 67  Resp: 16 18 18 17   Temp: 97.9 F (36.6 C) 98.4 F (36.9 C) 98.3 F (36.8 C) 98.5 F (36.9 C)  TempSrc:    Oral  SpO2: 98% 100% 98% 100%  Weight:        Intake/Output Summary (Last 24 hours) at 03/09/2023 0934 Last data filed at 03/08/2023 1559 Gross per 24 hour  Intake --  Output 100 ml  Net -100 ml   Filed Weights   03/05/23 0023  Weight: 59 kg    Examination:  Physical Exam Vitals and nursing note reviewed.  HENT:     Head: Normocephalic.     Ears:     Comments: Dried blood on left pinna. Pain with movement of left tragus and left pinna No otoscope on medical floor to view TM. Eyes:     General: No scleral icterus. Cardiovascular:     Rate and Rhythm: Normal rate and regular rhythm.  Pulmonary:     Effort: Pulmonary effort is normal. No respiratory distress.  Abdominal:     General: Abdomen is flat. Bowel sounds are normal.     Palpations: Abdomen is soft.  Musculoskeletal:     Right lower leg: No edema.     Left lower leg: No edema.  Skin:    General: Skin is warm and dry.  Neurological:     General: No focal  deficit present.     Mental Status: He is alert.     Comments: Only oriented to person not place or time.  Appears intellectually disabled.    Data Reviewed: I have personally reviewed following labs and imaging studies  CBC: Recent Labs  Lab 03/04/23 1551 03/05/23 0320 03/09/23 0557  WBC 4.6 3.1* 5.5  NEUTROABS 3.5  --  4.0  HGB 12.3* 10.2* 11.2*  HCT 36.3* 29.9* 32.1*  MCV 97.1 97.1 95.8  PLT 143* 109* 145*   Basic Metabolic Panel: Recent Labs  Lab 03/04/23 1551 03/05/23 0319 03/07/23 0524 03/09/23 0557  NA 137 137 141 145  K 4.3 4.1 3.3* 3.3*  CL 101 107 106 111  CO2 24 21* 27 26  GLUCOSE 93 96 95 102*  BUN 34* 41* 28* 29*  CREATININE 1.45* 1.75* 1.28* 1.12  CALCIUM 8.7* 7.6* 8.1* 8.1*   GFR: Estimated Creatinine Clearance: 44.6 mL/min (by C-G formula based on SCr of 1.12 mg/dL). Liver Function Tests: Recent Labs  Lab 03/04/23 1551 03/07/23 0524  03/09/23 0557  AST 61* 59* 34  ALT 23 24 19   ALKPHOS 52 42 39  BILITOT 1.6* 1.0 1.4*  PROT 6.5 5.7* 5.4*  ALBUMIN 3.8 3.1* 2.9*   Cardiac Enzymes: Recent Labs  Lab 03/05/23 0319 03/06/23 0241 03/07/23 0524 03/08/23 0752 03/09/23 0557  CKTOTAL 795* 585* 478* 224 140   Sepsis Labs: Recent Labs  Lab 03/05/23 0017 03/05/23 0209 03/05/23 0319  PROCALCITON  --   --  <0.10  LATICACIDVEN 1.2 1.6  --     Recent Results (from the past 240 hours)  Blood Culture (routine x 2)     Status: None (Preliminary result)   Collection Time: 03/04/23 11:51 PM   Specimen: BLOOD  Result Value Ref Range Status   Specimen Description BLOOD LEFT ARM  Final   Special Requests   Final    BOTTLES DRAWN AEROBIC AND ANAEROBIC Blood Culture results may not be optimal due to an inadequate volume of blood received in culture bottles   Culture   Final    NO GROWTH 4 DAYS Performed at [facility], [address] [place], [place] [address]    Report Status PENDING  Incomplete  Blood Culture (routine x 2)      Status: None (Preliminary result)   Collection Time: 03/05/23 12:17 AM   Specimen: BLOOD  Result Value Ref Range Status   Specimen Description BLOOD LEFT FOREARM  Final   Special Requests   Final    BOTTLES DRAWN AEROBIC AND ANAEROBIC Blood Culture adequate volume   Culture   Final    NO GROWTH 4 DAYS Performed at [facility], [address] [place], [place] [address]    Report Status PENDING  Incomplete  Resp panel by RT-PCR (RSV, Flu A&B, Covid) Anterior Nasal Swab     Status: Abnormal   Collection Time: 03/05/23 12:17 AM   Specimen: Anterior Nasal Swab  Result Value Ref Range Status   SARS Coronavirus 2 by RT PCR POSITIVE (A) NEGATIVE Final    Comment: (NOTE) SARS-CoV-2 target nucleic acids are DETECTED.  The SARS-CoV-2 RNA is generally detectable in upper respiratory specimens during the acute phase of infection. Positive results are indicative of the presence of the identified virus, but do not rule out bacterial infection or co-infection with other pathogens not detected by the test. Clinical correlation with patient history and other diagnostic information is necessary to determine patient infection status. The expected result is Negative.  Fact Sheet for Patients: BloggerCourse.com  Fact Sheet for Healthcare Providers: SeriousBroker.it  This test is not yet approved or cleared by the [place] FDA and  has been authorized for detection and/or diagnosis of SARS-CoV-2 by FDA under an Emergency Use Authorization (EUA).  This EUA will remain in effect (meaning this test can be used) for the duration of  the COVID-19 declaration under Section 564(b)(1) of the A ct, 21 U.S.C. section 360bbb-3(b)(1), unless the authorization is terminated or revoked sooner.     Influenza A by PCR NEGATIVE NEGATIVE Final   Influenza B by PCR NEGATIVE NEGATIVE Final    Comment: (NOTE) The Xpert Xpress SARS-CoV-2/FLU/RSV plus  assay is intended as an aid in the diagnosis of influenza from Nasopharyngeal swab specimens and should not be used as a sole basis for treatment. Nasal washings and aspirates are unacceptable for Xpert Xpress SARS-CoV-2/FLU/RSV testing.  Fact Sheet for Patients: BloggerCourse.com  Fact Sheet for Healthcare Providers: SeriousBroker.it  This test is not yet approved or cleared by the  [place] [employer] trader and has been authorized for detection and/or diagnosis of SARS-CoV-2 by FDA under an TEFL teacher (EUA). This EUA will remain in effect (meaning this test can be used) for the duration of the COVID-19 declaration under Section 564(b)(1) of the Act, 21 U.S.C. section 360bbb-3(b)(1), unless the authorization is terminated or revoked.     Resp Syncytial Virus by PCR NEGATIVE NEGATIVE Final    Comment: (NOTE) Fact Sheet for Patients: BloggerCourse.com  Fact Sheet for Healthcare Providers: SeriousBroker.it  This test is not yet approved or cleared by the [place] FDA and has been authorized for detection and/or diagnosis of SARS-CoV-2 by FDA under an Emergency Use Authorization (EUA). This EUA will remain in effect (meaning this test can be used) for the duration of the COVID-19 declaration under Section 564(b)(1) of the Act, 21 U.S.C. section 360bbb-3(b)(1), unless the authorization is terminated or revoked.  Performed at [facility], [address] [place], [place] [address]     Scheduled Meds:  amoxicillin-clavulanate  1 tablet Oral Q12H   vitamin C  1,000 mg Oral Daily   enoxaparin (LOVENOX) injection  40 mg Subcutaneous Q24H   potassium chloride  40 mEq Oral Q4H   Continuous Infusions:   LOS: 4 days   Time spent: 30 minutes  Carollee Herter, DO  [facility]  03/09/2023, 9:34 AM

## 2023-03-09 NOTE — Assessment & Plan Note (Signed)
03-09-2023 resolved with po kcl 40 meq x 2 doses.

## 2023-03-09 NOTE — Plan of Care (Signed)
  Problem: Education: Goal: Knowledge of risk factors and measures for prevention of condition will improve Outcome: Progressing   Problem: Respiratory: Goal: Will maintain a patent airway Outcome: Progressing   Problem: Education: Goal: Knowledge of General Education information will improve Description: Including pain rating scale, medication(s)/side effects and non-pharmacologic comfort measures Outcome: Progressing   Problem: Health Behavior/Discharge Planning: Goal: Ability to manage health-related needs will improve Outcome: Progressing   Problem: Activity: Goal: Risk for activity intolerance will decrease Outcome: Progressing   Problem: Nutrition: Goal: Adequate nutrition will be maintained Outcome: Progressing   Problem: Pain Managment: Goal: General experience of comfort will improve and/or be controlled Outcome: Progressing   Problem: Safety: Goal: Ability to remain free from injury will improve Outcome: Progressing

## 2023-03-09 NOTE — Assessment & Plan Note (Signed)
03-09-2023 start po augmentin 875 mg bid. Will try and bring my own personal otoscope from home as medical floor does not have an otoscope to use. 03-10-2023 movement of left pinna does not elicit pain. Palpation of left tragus of eye still tender. Small adenopathy anterior to left tragus. Continue augmentin bid. 03-11-2023 pt refusing to take po augmentin. Only partially compliant with cipro ear drops. Pt is afebrile.   03-12-2023 left EAC occluded with ear wax and appears to be dried blood. Still tender at left tragus. Continue with ear drops and po augmentin if he will allow it.

## 2023-03-10 DIAGNOSIS — E876 Hypokalemia: Secondary | ICD-10-CM | POA: Diagnosis not present

## 2023-03-10 DIAGNOSIS — R630 Anorexia: Secondary | ICD-10-CM

## 2023-03-10 DIAGNOSIS — H66002 Acute suppurative otitis media without spontaneous rupture of ear drum, left ear: Secondary | ICD-10-CM | POA: Diagnosis not present

## 2023-03-10 DIAGNOSIS — N1831 Chronic kidney disease, stage 3a: Secondary | ICD-10-CM | POA: Diagnosis not present

## 2023-03-10 DIAGNOSIS — U071 COVID-19: Secondary | ICD-10-CM | POA: Diagnosis not present

## 2023-03-10 LAB — CULTURE, BLOOD (ROUTINE X 2)
Culture: NO GROWTH
Culture: NO GROWTH
Special Requests: ADEQUATE

## 2023-03-10 MED ORDER — CIPROFLOXACIN HCL 0.3 % OP SOLN
2.0000 [drp] | OPHTHALMIC | Status: DC
  Administered 2023-03-10 – 2023-03-21 (×45): 2 [drp] via OTIC
  Filled 2023-03-10 (×3): qty 2.5

## 2023-03-10 MED ORDER — MIRTAZAPINE 15 MG PO TABS
7.5000 mg | ORAL_TABLET | Freq: Every day | ORAL | Status: DC
  Administered 2023-03-14 – 2023-03-20 (×7): 7.5 mg via ORAL
  Filled 2023-03-10 (×10): qty 1

## 2023-03-10 NOTE — Progress Notes (Addendum)
1055 This RN able to give lovonox injection but pt refused PO meds. Offered in applesauce, ice cream, pudding or anything of his choice. Pt still refuses. Made MD aware who will order ear drops.

## 2023-03-10 NOTE — Progress Notes (Addendum)
PROGRESS NOTE    Drew Russell  ZOX:[id number] DOB: 04-28-1945 DOA: 03/04/2023 PCP: Larae Grooms, NP  Subjective: Pt seen and examined. On RA. Left ear not as tender. I was unable to find my otoscope at home. Medical floor does not have otoscope.  RN reports pt not eating any food. Only drinking water   Hospital Course: HPI: Drew Russell is a 78 y.o. Caucasian male with medical history significant for hypertension and dyslipidemia as well as dementia, presented to the emergency room with acute onset of altered mental status and fall.  Apparently fell at home yesterday and his brother was feeding him while he was on the floor waiting for him to get enough energy to get up.  He denied any chest pain or palpitations earlier.  No cough or wheezing or dyspnea.  No neck pain or stiffness.  No fever or chills.  He depleted and dehydrated with cracked lips and dry mucous membranes.  he was very somnolent during my interview and only responsive to painful stimuli and therefore no further history could be obtained.   Significant Events: Admitted 03/04/2023 non-traumatic rhabdomyolysis and Covid-19 infection   Significant Labs: BUN of 34 with a creatinine of 1.45 close to previous levels. Calcium was 8.7 and AST was 61 with total bili 1.6 with otherwise unremarkable CMP. Total CK was 1041 and high sensitive troponin was 36. CBC showed anemia with hemoglobin of 12.3 and hematocrit 36.3 better than previous levels and thrombocytopenia of 143 compared to 185 in October last year. Lactic acid is 1.2. COVID-19 PCR was POSITIVE and the rest of the respiratory panel came back negative.   Significant Imaging Studies: Noncontrasted head CT scan revealed no acute ventricular abnormalities.  Portable chest x-ray showed no acute cardiopulmonary disease.   Antibiotic Therapy: Anti-infectives (From admission, onward)    Start     Dose/Rate Route Frequency Ordered Stop   03/05/23 0130  vancomycin  (VANCOREADY) IVPB 1250 mg/250 mL        1,250 mg 166.7 mL/hr over 90 Minutes Intravenous  Once 03/05/23 0127 03/05/23 0311   03/05/23 0015  ceFEPIme (MAXIPIME) 2 g in sodium chloride 0.9 % 100 mL IVPB        2 g 200 mL/hr over 30 Minutes Intravenous  Once 03/05/23 0009 03/05/23 0128       Procedures:   Consultants:     Assessment and Plan: * Non-traumatic rhabdomyolysis 03-06-2023 CK has come down nicely. No further need for IVF. Can potentially go home if pt's family can be located and will take him back. Will need PT/OT assessment. 03-07-2023 CK down to 478. No need to continue checking it. Needs SNF placement.  03-08-2023 resolved.  Anorexia 03-10-2023 add low dose remeron to help with his appetite. 7.5 mg at bedtime.  Hypokalemia 03-09-2023 resolved with po kcl 40 meq x 2 doses.  Acute exudative otitis media of left ear 03-09-2023 start po augmentin 875 mg bid. Will try and bring my own personal otoscope from home as medical floor does not have an otoscope to use.  03-10-2023 movement of left pinna does not elicit pain. Palpation of left tragus of eye still tender. Small adenopathy anterior to left tragus. Continue augmentin bid.  COVID-19 virus infection 03-06-2023 continue supportive care. On RA. 03-07-2023 stable. On RA. 03-08-2023 stable. On RA. 03-09-2023 stable.  Acute metabolic encephalopathy 03-06-2023  This is likely associated with COVID-19 and his dementia together with acute rhabdomyolysis. UA negative for infection. 03-07-2023 resolved. Pt seems  confused at baseline.  03-08-2023 resolved  Dyslipidemia 03-06-2023 Will hold statin therapy given his rhabdomyolysis. 03-07-2023 stable. Restart statin at discharge. 03-08-2023 stable  03-09-2023 stable.   Hypotension due to hypovolemia On admission, Likely due to lack po liquid intake. Pt is not septic. Stop midodrine. Monitor BP. 03-07-2023 resolved. BP stable.  03-08-2023 resolved.  Pancytopenia  (HCC) 03-06-2023 leukopenia and thrombocytopenia likely due to his covid-19. Pt was already anemia prior to admission. Pancytopenia not from any known cancer. 03-07-2023 stable. Will repeat CBC at discharge. 03-08-2023 stable 03-09-2023 resolved. has anemia. Plt count improved to 145K. WBC now 5.5  Anemia in chronic kidney disease (CKD) 03-06-2023 chronic anemia. 03-07-2023 stable. 03-08-2023 stable 03-09-2023 stable. HgB 11.2 g/dl  CKD stage 3a, GFR 91-47 ml/min (HCC) - baseline scR 1.4-1.6 03-06-2023 Scr slightly above his baseline of 1.4-1.6 03-07-2023 Today, BUN 28, Scr 1.28. stable now. 03-08-2023 stable  03-09-2023 stable. Scr 1.12, BUN 29       DVT prophylaxis: enoxaparin (LOVENOX) injection 40 mg Start: 03/05/23 0800    Code Status: Full Code Family Communication: no family at bedside Disposition Plan: SNF placement Reason for continuing need for hospitalization: medically stable for DC.  Objective: Vitals:   03/08/23 2312 03/09/23 0753 03/09/23 1740 03/10/23 0719  BP: 111/62 113/73 110/73 110/75  Pulse: 69 67 86 77  Resp: 18 17 18 16   Temp: 98.3 F (36.8 C) 98.5 F (36.9 C) 98.3 F (36.8 C) 97.7 F (36.5 C)  TempSrc:  Oral Oral Axillary  SpO2: 98% 100% 100% 100%  Weight:        Intake/Output Summary (Last 24 hours) at 03/10/2023 0930 Last data filed at 03/09/2023 1549 Gross per 24 hour  Intake 70 ml  Output --  Net 70 ml   Filed Weights   03/05/23 0023  Weight: 59 kg    Examination:  Physical Exam Vitals and nursing note reviewed.  Constitutional:      General: He is not in acute distress.    Appearance: He is not toxic-appearing or diaphoretic.  HENT:     Head: Normocephalic and atraumatic.     Ears:     Comments: Tenderness left tragus. Small adenopathy anterior to left tragus. Movement of left pinna does not cause pain. No purulent drainage from left EAC. Eyes:     General: No scleral icterus. Cardiovascular:     Rate and Rhythm:  Normal rate and regular rhythm.  Pulmonary:     Effort: Pulmonary effort is normal. No respiratory distress.  Abdominal:     General: Bowel sounds are normal.  Musculoskeletal:     Right lower leg: No edema.     Left lower leg: No edema.  Skin:    General: Skin is warm and dry.     Capillary Refill: Capillary refill takes less than 2 seconds.  Neurological:     Comments: Oriented to person. Not to time or place.     Data Reviewed: I have personally reviewed following labs and imaging studies  CBC: Recent Labs  Lab 03/04/23 1551 03/05/23 0320 03/09/23 0557  WBC 4.6 3.1* 5.5  NEUTROABS 3.5  --  4.0  HGB 12.3* 10.2* 11.2*  HCT 36.3* 29.9* 32.1*  MCV 97.1 97.1 95.8  PLT 143* 109* 145*   Basic Metabolic Panel: Recent Labs  Lab 03/04/23 1551 03/05/23 0319 03/07/23 0524 03/09/23 0557  NA 137 137 141 145  K 4.3 4.1 3.3* 3.3*  CL 101 107 106 111  CO2 24 21* 27 26  GLUCOSE 93 96 95 102*  BUN 34* 41* 28* 29*  CREATININE 1.45* 1.75* 1.28* 1.12  CALCIUM 8.7* 7.6* 8.1* 8.1*   GFR: Estimated Creatinine Clearance: 44.6 mL/min (by C-G formula based on SCr of 1.12 mg/dL). Liver Function Tests: Recent Labs  Lab 03/04/23 1551 03/07/23 0524 03/09/23 0557  AST 61* 59* 34  ALT 23 24 19   ALKPHOS 52 42 39  BILITOT 1.6* 1.0 1.4*  PROT 6.5 5.7* 5.4*  ALBUMIN 3.8 3.1* 2.9*   Cardiac Enzymes: Recent Labs  Lab 03/05/23 0319 03/06/23 0241 03/07/23 0524 03/08/23 0752 03/09/23 0557  CKTOTAL 795* 585* 478* 224 140   Sepsis Labs: Recent Labs  Lab 03/05/23 0017 03/05/23 0209 03/05/23 0319  PROCALCITON  --   --  <0.10  LATICACIDVEN 1.2 1.6  --     Recent Results (from the past 240 hours)  Blood Culture (routine x 2)     Status: None   Collection Time: 03/04/23 11:51 PM   Specimen: BLOOD  Result Value Ref Range Status   Specimen Description BLOOD LEFT ARM  Final   Special Requests   Final    BOTTLES DRAWN AEROBIC AND ANAEROBIC Blood Culture results may not be  optimal due to an inadequate volume of blood received in culture bottles   Culture   Final    NO GROWTH 5 DAYS Performed at [facility], [address] [place], [place] [address]    Report Status 03/10/2023 FINAL  Final  Blood Culture (routine x 2)     Status: None   Collection Time: 03/05/23 12:17 AM   Specimen: BLOOD  Result Value Ref Range Status   Specimen Description BLOOD LEFT FOREARM  Final   Special Requests   Final    BOTTLES DRAWN AEROBIC AND ANAEROBIC Blood Culture adequate volume   Culture   Final    NO GROWTH 5 DAYS Performed at [facility], [address] [place], [place] [address]    Report Status 03/10/2023 FINAL  Final  Resp panel by RT-PCR (RSV, Flu A&B, Covid) Anterior Nasal Swab     Status: Abnormal   Collection Time: 03/05/23 12:17 AM   Specimen: Anterior Nasal Swab  Result Value Ref Range Status   SARS Coronavirus 2 by RT PCR POSITIVE (A) NEGATIVE Final    Comment: (NOTE) SARS-CoV-2 target nucleic acids are DETECTED.  The SARS-CoV-2 RNA is generally detectable in upper respiratory specimens during the acute phase of infection. Positive results are indicative of the presence of the identified virus, but do not rule out bacterial infection or co-infection with other pathogens not detected by the test. Clinical correlation with patient history and other diagnostic information is necessary to determine patient infection status. The expected result is Negative.  Fact Sheet for Patients: BloggerCourse.com  Fact Sheet for Healthcare Providers: SeriousBroker.it  This test is not yet approved or cleared by the [place] FDA and  has been authorized for detection and/or diagnosis of SARS-CoV-2 by FDA under an Emergency Use Authorization (EUA).  This EUA will remain in effect (meaning this test can be used) for the duration of  the COVID-19 declaration under Section 564(b)(1) of the A  ct, 21 U.S.C. section 360bbb-3(b)(1), unless the authorization is terminated or revoked sooner.     Influenza A by PCR NEGATIVE NEGATIVE Final   Influenza B by PCR NEGATIVE NEGATIVE Final    Comment: (NOTE) The Xpert Xpress SARS-CoV-2/FLU/RSV plus assay is intended as an aid in the diagnosis of influenza from  Nasopharyngeal swab specimens and should not be used as a sole basis for treatment. Nasal washings and aspirates are unacceptable for Xpert Xpress SARS-CoV-2/FLU/RSV testing.  Fact Sheet for Patients: BloggerCourse.com  Fact Sheet for Healthcare Providers: SeriousBroker.it  This test is not yet approved or cleared by the [place] FDA and has been authorized for detection and/or diagnosis of SARS-CoV-2 by FDA under an Emergency Use Authorization (EUA). This EUA will remain in effect (meaning this test can be used) for the duration of the COVID-19 declaration under Section 564(b)(1) of the Act, 21 U.S.C. section 360bbb-3(b)(1), unless the authorization is terminated or revoked.     Resp Syncytial Virus by PCR NEGATIVE NEGATIVE Final    Comment: (NOTE) Fact Sheet for Patients: BloggerCourse.com  Fact Sheet for Healthcare Providers: SeriousBroker.it  This test is not yet approved or cleared by the [place] FDA and has been authorized for detection and/or diagnosis of SARS-CoV-2 by FDA under an Emergency Use Authorization (EUA). This EUA will remain in effect (meaning this test can be used) for the duration of the COVID-19 declaration under Section 564(b)(1) of the Act, 21 U.S.C. section 360bbb-3(b)(1), unless the authorization is terminated or revoked.  Performed at [facility], [address], [place], [place] [address]      Radiology Studies: No results found.  Scheduled Meds:  amoxicillin-clavulanate  1 tablet Oral Q12H   vitamin C  1,000 mg  Oral Daily   enoxaparin (LOVENOX) injection  40 mg Subcutaneous Q24H   Continuous Infusions:   LOS: 5 days   Time spent: 40 minutes  Carollee Herter, DO  [facility]  03/10/2023, 9:30 AM

## 2023-03-10 NOTE — Plan of Care (Signed)
  Problem: Education: Goal: Knowledge of risk factors and measures for prevention of condition will improve Outcome: Progressing   Problem: Education: Goal: Knowledge of General Education information will improve Description: Including pain rating scale, medication(s)/side effects and non-pharmacologic comfort measures Outcome: Progressing   Problem: Activity: Goal: Risk for activity intolerance will decrease Outcome: Progressing   Problem: Nutrition: Goal: Adequate nutrition will be maintained Outcome: Progressing   Problem: Pain Managment: Goal: General experience of comfort will improve and/or be controlled Outcome: Progressing   Problem: Skin Integrity: Goal: Risk for impaired skin integrity will decrease Outcome: Progressing

## 2023-03-10 NOTE — Plan of Care (Signed)
  Problem: Education: Goal: Knowledge of risk factors and measures for prevention of condition will improve Outcome: Progressing   Problem: Nutrition: Goal: Adequate nutrition will be maintained Outcome: Progressing   Problem: Safety: Goal: Ability to remain free from injury will improve Outcome: Progressing

## 2023-03-10 NOTE — Assessment & Plan Note (Signed)
03-10-2023 add low dose remeron to help with his appetite. 7.5 mg at bedtime. 03-11-2023 pt refused remeron dose last night.  03-12-2023 continues to refuse remeron. RD modified pt's diet texture. Continue to offer protein shakes, ice cream, etc. I suspect that pt's diet is mostly processed foods and that the hospital diet is actually more nutritious than what he is used to eating at home.

## 2023-03-10 NOTE — TOC Progression Note (Signed)
Transition of Care [facility]) - Progression Note    Patient Details  Name: Drew Russell MRN: [id number] Date of Birth: 1945-09-24  Transition of [facility]) CM/SW Contact  Liliana Cline, LCSW Phone Number: 03/10/2023, 1:16 PM  Clinical Narrative:    CSW checked with RN - per RN patient is confused.  CSW called sister [name] to discuss SNF bed offers, left a VM.        Expected Discharge Plan and Services                                               Social Determinants of Health (SDOH) Interventions SDOH Screenings   Food Insecurity: Patient Unable To Answer (03/08/2023)  Housing: Patient Unable To Answer (03/08/2023)  Transportation Needs: Patient Unable To Answer (03/08/2023)  Utilities: Patient Unable To Answer (03/08/2023)  Alcohol Screen: Low Risk  (08/08/2021)  Depression (PHQ2-9): Low Risk  (12/03/2022)  Financial Resource Strain: Low Risk  (08/08/2021)  Physical Activity: Insufficiently Active (08/08/2021)  Social Connections: Unknown (03/08/2023)  Stress: No Stress Concern Present (08/08/2021)  Tobacco Use: Medium Risk (12/03/2022)    Readmission Risk Interventions     No data to display

## 2023-03-11 DIAGNOSIS — M6282 Rhabdomyolysis: Secondary | ICD-10-CM | POA: Diagnosis not present

## 2023-03-11 DIAGNOSIS — U071 COVID-19: Secondary | ICD-10-CM | POA: Diagnosis not present

## 2023-03-11 DIAGNOSIS — R630 Anorexia: Secondary | ICD-10-CM

## 2023-03-11 DIAGNOSIS — H66002 Acute suppurative otitis media without spontaneous rupture of ear drum, left ear: Secondary | ICD-10-CM | POA: Diagnosis not present

## 2023-03-11 MED ORDER — ENSURE ENLIVE PO LIQD
237.0000 mL | Freq: Two times a day (BID) | ORAL | Status: DC
  Administered 2023-03-12 – 2023-03-20 (×11): 237 mL via ORAL

## 2023-03-11 MED ORDER — ADULT MULTIVITAMIN W/MINERALS CH
1.0000 | ORAL_TABLET | Freq: Every day | ORAL | Status: DC
  Administered 2023-03-15 – 2023-03-21 (×6): 1 via ORAL
  Filled 2023-03-11 (×10): qty 1

## 2023-03-11 NOTE — Progress Notes (Signed)
Initial Nutrition Assessment  DOCUMENTATION CODES:   Severe malnutrition in context of chronic illness  INTERVENTION:   -Downgrade diet to dysphagia 1 for ease of intake -MVI with minerals daily -Ensure Enlive po BID, each supplement provides 350 kcal and 20 grams of protein.  -Magic cup TID with meals, each supplement provides 290 kcal and 9 grams of protein  -Recommend SLP evaluation for swallow assessment; discussed concerns with MD and RN: MD does not think poor oral intake is swallow or cognition related  NUTRITION DIAGNOSIS:   Severe Malnutrition related to chronic illness (dementia) as evidenced by moderate fat depletion, severe fat depletion, moderate muscle depletion, severe muscle depletion.  GOAL:   Patient will meet greater than or equal to 90% of their needs  MONITOR:   PO intake, Supplement acceptance, Diet advancement  REASON FOR ASSESSMENT:   Consult Assessment of nutrition requirement/status  ASSESSMENT:   Pt with medical history significant for hypertension and dyslipidemia as well as dementia, presented with acute onset of altered mental status and fall.  Pt admitted with rhabdomyolysis and COVID-19 virus infection.   Reviewed I/O's: -100 ml x 24 hours and +370 ml since admission  UOP: 200 ml x 24 hours  Per RN, pt refusing medications and meals today. Per physical therapy, pt was confused during evaluation. Confusion has been noted throughout admission.   Pt lying in bed at time of visit. He did arouse briefly to voice, but was not very interactive with this RD and did not answer most questions despite rephrasing and asking different ways. Observed lunch tray- pt consumed a bite of puree meat, 25% of Magic Cup, and a few sips of tea. Noted pt with poor dentition and no teeth.   RD asked pt if he had difficulty swallowing foods and pills. Pt replied "that's not what I said". RD attempted to seek clarification, however, pt kept eyes closed and refused to  answer questions stating "I would like to rest now".   RD attempted to obtain diet recall to assess home diet, however, pt did not respond to RD questions.   Pt currently on a dysphagia 2 diet. Noted meal completions 0-5%.   Suspects that some poor oral intake may be cognition related. RD downgraded diet further due to lack of teeth and ease of intake. Case discussed with MD and N; discussed consider over cognition and poor oral intake (RD unsure of baseline cognition status at this time). Recommended SLP consult to assess swallowing. MD denies need for swallow eval and reports that he believes that pt is not eating as he is not used to hospital food and believes that home diet consists mostly of processed foods, such as hot dogs.    Reviewed wt hx; pt has experienced a 11.5% wt loss over the past year. While this is not significant for time frame, it is concerning given advanced age, dementia, and poor oral intake. Pt meets criteria for severe malnutrition die to results of nutrition-focused physical exam.   Medications reviewed and include vitamin C, remeron, and MVI.   Noted pt has not had a BM since 1/02/25/23, likely related to lack of PO intake.    Per TOC notes, plan for SNF placement once medically stable.   Labs reviewed: K: 3.3.    NUTRITION - FOCUSED PHYSICAL EXAM:  Flowsheet Row Most Recent Value  Orbital Region Moderate depletion  Upper Arm Region Severe depletion  Thoracic and Lumbar Region Moderate depletion  Buccal Region Severe depletion  [place] Region  Severe depletion  Clavicle Bone Region Severe depletion  Clavicle and Acromion Bone Region Severe depletion  Scapular Bone Region Severe depletion  Dorsal Hand Severe depletion  Patellar Region Moderate depletion  Anterior Thigh Region Moderate depletion  Posterior Calf Region Moderate depletion  Edema (RD Assessment) None  Hair Reviewed  Eyes Reviewed  Mouth Reviewed  Skin Reviewed  Nails Reviewed       Diet  Order:   Diet Order             DIET - DYS 1 Room service appropriate? Yes; Fluid consistency: Thin  Diet effective now                   EDUCATION NEEDS:   Not appropriate for education at this time  Skin:  Skin Assessment: Reviewed RN Assessment  Last BM:  03/07/23  Height:   Ht Readings from Last 1 Encounters:  12/03/22 5' 5.5" (1.664 m)    Weight:   Wt Readings from Last 1 Encounters:  03/05/23 59 kg    Ideal Body Weight:     BMI:  Body mass index is 21.3 kg/m.  Estimated Nutritional Needs:   Kcal:  1750-1950  Protein:  90-105 grams  Fluid:  > 1.7 L    Levada Schilling, RD, LDN, CDCES Registered Dietitian III Certified Diabetes Care and Education Specialist If unable to reach this RD, please use "RD Inpatient" group chat on secure chat between hours of 8am-4 pm daily

## 2023-03-11 NOTE — Progress Notes (Signed)
Physical Therapy Treatment Patient Details Name: Drew Russell MRN: [id number] DOB: 1945/08/04 Today's Date: 03/11/2023   History of Present Illness Pt is a 78 year old male presented to the ED with AMS, admitted with non-traumatic rhabdomyolysis and Covid-19 infection  PMH significant for hypertension and dyslipidemia as well as dementia,    PT Comments  Pt in bed.  Has pulled off male purewic and removed clothing.  Replaced purwic with resistance.  Old purwic is wet but at this time bed is dry.  Re-dressed patient.  Attempted LE ex but he resists and rolls to right side.  From side lying he is assisted to sitting with max a x 1.  He resists pushing right and post so sitting time is limited "If you don't stop I am going to close this desk!"  Pt is assisted back to side lying and positioned for comfort.  Cognition seems to be primary barrier as he does have fair strength to resist and roll away in bed.  Would need +2 for attempts at standing/mobility when he is able to participate with tasks.    If plan is discharge home, recommend the following: A lot of help with walking and/or transfers;A lot of help with bathing/dressing/bathroom;Assistance with cooking/housework;Direct supervision/assist for medications management;Direct supervision/assist for financial management;Assist for transportation;Help with stairs or ramp for entrance;Supervision due to cognitive status   Can travel by private vehicle        Equipment Recommendations       Recommendations for Other Services       Precautions / Restrictions Precautions Precautions: Fall Restrictions Weight Bearing Restrictions Per Provider Order: No     Mobility  Bed Mobility Overal bed mobility: Needs Assistance Bed Mobility: Supine to Sit, Sit to Supine, Rolling Rolling: Max assist   Supine to sit: Max assist Sit to supine: Mod assist   General bed mobility comments: resists activity Patient Response: Restless  Transfers                    General transfer comment: did not attempt at this time.  would need +2 for attempt at standing mobility    Ambulation/Gait               General Gait Details: unsafe to attempt   Stairs             Wheelchair Mobility     Tilt Bed Tilt Bed Patient Response: Restless  Modified Rankin (Stroke Patients Only)       Balance Overall balance assessment: Needs assistance Sitting-balance support: Feet supported Sitting balance-Leahy Scale: Zero Sitting balance - Comments: resists sitting and pushes to lay back down                                    Cognition Arousal: Alert Behavior During Therapy: Agitated Overall Cognitive Status: No family/caregiver present to determine baseline cognitive functioning                                 General Comments: agitated with attempts at mobility and ex        Exercises Other Exercises Other Exercises: attempted supine ex but he resists    General Comments        Pertinent Vitals/Pain Pain Assessment Pain Assessment: No/denies pain Pain Intervention(s): Monitored during session    Home Living  Prior Function            PT Goals (current goals can now be found in the care plan section) Progress towards PT goals: Not progressing toward goals - comment    Frequency    Min 1X/week      PT Plan      Co-evaluation              AM-PAC PT "6 Clicks" Mobility   Outcome Measure  Help needed turning from your back to your side while in a flat bed without using bedrails?: Total Help needed moving from lying on your back to sitting on the side of a flat bed without using bedrails?: Total Help needed moving to and from a bed to a chair (including a wheelchair)?: Total Help needed standing up from a chair using your arms (e.g., wheelchair or bedside chair)?: Total Help needed to walk in hospital room?: Total Help  needed climbing 3-5 steps with a railing? : Total 6 Click Score: 6    End of Session   Activity Tolerance: Treatment limited secondary to agitation Patient left: in bed;with call bell/phone within reach;with bed alarm set Nurse Communication: Mobility status PT Visit Diagnosis: Unsteadiness on feet (R26.81);Muscle weakness (generalized) (M62.81);Other abnormalities of gait and mobility (R26.89)     Time: 4782-9562 PT Time Calculation (min) (ACUTE ONLY): 11 min  Charges:    $Therapeutic Activity: 8-22 mins PT General Charges $$ ACUTE PT VISIT: 1 Visit                   Danielle Dess, PTA 03/11/23, 3:23 PM

## 2023-03-11 NOTE — Plan of Care (Signed)
  Problem: Respiratory: Goal: Will maintain a patent airway Outcome: Progressing Goal: Complications related to the disease process, condition or treatment will be avoided or minimized Outcome: Progressing   Problem: Education: Goal: Knowledge of General Education information will improve Description: Including pain rating scale, medication(s)/side effects and non-pharmacologic comfort measures Outcome: Progressing   Problem: Health Behavior/Discharge Planning: Goal: Ability to manage health-related needs will improve Outcome: Progressing   Problem: Pain Managment: Goal: General experience of comfort will improve and/or be controlled Outcome: Progressing

## 2023-03-11 NOTE — Progress Notes (Signed)
PROGRESS NOTE    Drew Russell  BJY:[id number] DOB: [birth year] DOA: 03/04/2023 PCP: Larae Grooms, NP  Subjective: Drew Russell seen and examined. On RA.  Eating very little. Started on remeron last night but Drew Russell refused to take it. Drew Russell refusing po augmentin. Cipro ear drops started yesterday. Drew Russell only partially compliant with ear drops.   Hospital Course: HPI: Drew Russell is a 78 y.o. Caucasian male with medical history significant for hypertension and dyslipidemia as well as dementia, presented to the emergency room with acute onset of altered mental status and fall.  Apparently fell at home yesterday and his brother was feeding him while he was on the floor waiting for him to get enough energy to get up.  He denied any chest pain or palpitations earlier.  No cough or wheezing or dyspnea.  No neck pain or stiffness.  No fever or chills.  He depleted and dehydrated with cracked lips and dry mucous membranes.  he was very somnolent during my interview and only responsive to painful stimuli and therefore no further history could be obtained.   Significant Events: Admitted 03/04/2023 non-traumatic rhabdomyolysis and Covid-19 infection   Significant Labs: BUN of 34 with a creatinine of 1.45 close to previous levels. Calcium was 8.7 and AST was 61 with total bili 1.6 with otherwise unremarkable CMP. Total CK was 1041 and high sensitive troponin was 36. CBC showed anemia with hemoglobin of 12.3 and hematocrit 36.3 better than previous levels and thrombocytopenia of 143 compared to 185 in October last year. Lactic acid is 1.2. COVID-19 PCR was POSITIVE and the rest of the respiratory panel came back negative.   Significant Imaging Studies: Noncontrasted head CT scan revealed no acute ventricular abnormalities.  Portable chest x-ray showed no acute cardiopulmonary disease.   Antibiotic Therapy: Anti-infectives (From admission, onward)    Start     Dose/Rate Route Frequency Ordered Stop   03/05/23  0130  vancomycin (VANCOREADY) IVPB 1250 mg/250 mL        1,250 mg 166.7 mL/hr over 90 Minutes Intravenous  Once 03/05/23 0127 03/05/23 0311   03/05/23 0015  ceFEPIme (MAXIPIME) 2 g in sodium chloride 0.9 % 100 mL IVPB        2 g 200 mL/hr over 30 Minutes Intravenous  Once 03/05/23 0009 03/05/23 0128       Procedures:   Consultants:     Assessment and Plan: * Non-traumatic rhabdomyolysis 03-06-2023 CK has come down nicely. No further need for IVF. Can potentially go home if Drew Russell's family can be located and will take him back. Will need Drew Russell/OT assessment. 03-07-2023 CK down to 478. No need to continue checking it. Needs SNF placement.  03-08-2023 resolved.  Anorexia 03-10-2023 add low dose remeron to help with his appetite. 7.5 mg at bedtime. 03-11-2023 Drew Russell refused remeron dose last night.  Hypokalemia 03-09-2023 resolved with po kcl 40 meq x 2 doses.  Acute exudative otitis media of left ear 03-09-2023 start po augmentin 875 mg bid. Will try and bring my own personal otoscope from home as medical floor does not have an otoscope to use. 03-10-2023 movement of left pinna does not elicit pain. Palpation of left tragus of eye still tender. Small adenopathy anterior to left tragus. Continue augmentin bid.  03-11-2023 Drew Russell refusing to take po augmentin. Only partially compliant with cipro ear drops. Drew Russell is afebrile.   COVID-19 virus infection 03-06-2023 continue supportive care. On RA. 03-07-2023 stable. On RA. 03-08-2023 stable. On RA. 03-09-2023 stable.  Acute  metabolic encephalopathy 03-06-2023  This is likely associated with COVID-19 and his dementia together with acute rhabdomyolysis. UA negative for infection. 03-07-2023 resolved. Drew Russell seems confused at baseline.  03-08-2023 resolved  Dyslipidemia 03-06-2023 Will hold statin therapy given his rhabdomyolysis. 03-07-2023 stable. Restart statin at discharge. 03-08-2023 stable  03-09-2023 stable.   Hypotension due to  hypovolemia On admission, Likely due to lack po liquid intake. Drew Russell is not septic. Stop midodrine. Monitor BP. 03-07-2023 resolved. BP stable.  03-08-2023 resolved.  Pancytopenia (HCC) 03-06-2023 leukopenia and thrombocytopenia likely due to his covid-19. Drew Russell was already anemia prior to admission. Pancytopenia not from any known cancer. 03-07-2023 stable. Will repeat CBC at discharge. 03-08-2023 stable 03-09-2023 resolved. has anemia. Plt count improved to 145K. WBC now 5.5  Anemia in chronic kidney disease (CKD) 03-06-2023 chronic anemia. 03-07-2023 stable. 03-08-2023 stable 03-09-2023 stable. HgB 11.2 g/dl  CKD stage 3a, GFR 25-36 ml/min (HCC) - baseline scR 1.4-1.6 03-06-2023 Scr slightly above his baseline of 1.4-1.6 03-07-2023 Today, BUN 28, Scr 1.28. stable now. 03-08-2023 stable  03-09-2023 stable. Scr 1.12, BUN 29   DVT prophylaxis: enoxaparin (LOVENOX) injection 40 mg Start: 03/05/23 0800    Code Status: Full Code Family Communication: no family at bedside Disposition Plan: SNF placement Reason for continuing need for hospitalization: medically stable for DC.  Objective: Vitals:   03/10/23 0719 03/10/23 1553 03/10/23 2345 03/11/23 0947  BP: 110/75 124/81 131/72 114/60  Pulse: 77 78 88 71  Resp: 16 20  18   Temp: 97.7 F (36.5 C) 97.7 F (36.5 C) 98 F (36.7 C) 98 F (36.7 C)  TempSrc: Axillary Axillary    SpO2: 100% 98%  100%  Weight:        Intake/Output Summary (Last 24 hours) at 03/11/2023 1015 Last data filed at 03/10/2023 1611 Gross per 24 hour  Intake --  Output 200 ml  Net -200 ml   Filed Weights   03/05/23 0023  Weight: 59 kg    Examination:  Physical Exam Vitals and nursing note reviewed.  Constitutional:      General: He is not in acute distress.    Appearance: He is not toxic-appearing or diaphoretic.  HENT:     Head: Normocephalic and atraumatic.     Nose: Nose normal.  Eyes:     Comments: Mild left tragus tenderness   Cardiovascular:     Rate and Rhythm: Normal rate and regular rhythm.  Pulmonary:     Effort: Pulmonary effort is normal.     Breath sounds: Normal breath sounds.  Abdominal:     General: Abdomen is flat. Bowel sounds are normal.     Palpations: Abdomen is soft.  Musculoskeletal:     Right lower leg: No edema.     Left lower leg: No edema.  Skin:    General: Skin is warm and dry.     Capillary Refill: Capillary refill takes less than 2 seconds.  Neurological:     General: No focal deficit present.     Mental Status: He is alert.     Comments: At baseline he is confused.     Data Reviewed: I have personally reviewed following labs and imaging studies  CBC: Recent Labs  Lab 03/04/23 1551 03/05/23 0320 03/09/23 0557  WBC 4.6 3.1* 5.5  NEUTROABS 3.5  --  4.0  HGB 12.3* 10.2* 11.2*  HCT 36.3* 29.9* 32.1*  MCV 97.1 97.1 95.8  PLT 143* 109* 145*   Basic Metabolic Panel: Recent Labs  Lab 03/04/23 1551  03/05/23 0319 03/07/23 0524 03/09/23 0557  NA 137 137 141 145  K 4.3 4.1 3.3* 3.3*  CL 101 107 106 111  CO2 24 21* 27 26  GLUCOSE 93 96 95 102*  BUN 34* 41* 28* 29*  CREATININE 1.45* 1.75* 1.28* 1.12  CALCIUM 8.7* 7.6* 8.1* 8.1*   GFR: Estimated Creatinine Clearance: 44.6 mL/min (by C-G formula based on SCr of 1.12 mg/dL). Liver Function Tests: Recent Labs  Lab 03/04/23 1551 03/07/23 0524 03/09/23 0557  AST 61* 59* 34  ALT 23 24 19   ALKPHOS 52 42 39  BILITOT 1.6* 1.0 1.4*  PROT 6.5 5.7* 5.4*  ALBUMIN 3.8 3.1* 2.9*   Cardiac Enzymes: Recent Labs  Lab 03/05/23 0319 03/06/23 0241 03/07/23 0524 03/08/23 0752 03/09/23 0557  CKTOTAL 795* 585* 478* 224 140   Sepsis Labs: Recent Labs  Lab 03/05/23 0017 03/05/23 0209 03/05/23 0319  PROCALCITON  --   --  <0.10  LATICACIDVEN 1.2 1.6  --     Recent Results (from the past 240 hours)  Blood Culture (routine x 2)     Status: None   Collection Time: 03/04/23 11:51 PM   Specimen: BLOOD  Result Value Ref  Range Status   Specimen Description BLOOD LEFT ARM  Final   Special Requests   Final    BOTTLES DRAWN AEROBIC AND ANAEROBIC Blood Culture results may not be optimal due to an inadequate volume of blood received in culture bottles   Culture   Final    NO GROWTH 5 DAYS Performed at [facility], [address] [place], [place] [address]    Report Status 03/10/2023 FINAL  Final  Blood Culture (routine x 2)     Status: None   Collection Time: 03/05/23 12:17 AM   Specimen: BLOOD  Result Value Ref Range Status   Specimen Description BLOOD LEFT FOREARM  Final   Special Requests   Final    BOTTLES DRAWN AEROBIC AND ANAEROBIC Blood Culture adequate volume   Culture   Final    NO GROWTH 5 DAYS Performed at [facility], [address] [place], [place] [address]    Report Status 03/10/2023 FINAL  Final  Resp panel by RT-PCR (RSV, Flu A&B, Covid) Anterior Nasal Swab     Status: Abnormal   Collection Time: 03/05/23 12:17 AM   Specimen: Anterior Nasal Swab  Result Value Ref Range Status   SARS Coronavirus 2 by RT PCR POSITIVE (A) NEGATIVE Final    Comment: (NOTE) SARS-CoV-2 target nucleic acids are DETECTED.  The SARS-CoV-2 RNA is generally detectable in upper respiratory specimens during the acute phase of infection. Positive results are indicative of the presence of the identified virus, but do not rule out bacterial infection or co-infection with other pathogens not detected by the test. Clinical correlation with patient history and other diagnostic information is necessary to determine patient infection status. The expected result is Negative.  Fact Sheet for Patients: BloggerCourse.com  Fact Sheet for Healthcare Providers: SeriousBroker.it  This test is not yet approved or cleared by the [place] FDA and  has been authorized for detection and/or diagnosis of SARS-CoV-2 by FDA under an Emergency Use  Authorization (EUA).  This EUA will remain in effect (meaning this test can be used) for the duration of  the COVID-19 declaration under Section 564(b)(1) of the A ct, 21 U.S.C. section 360bbb-3(b)(1), unless the authorization is terminated or revoked sooner.     Influenza A by PCR NEGATIVE NEGATIVE Final  Influenza B by PCR NEGATIVE NEGATIVE Final    Comment: (NOTE) The Xpert Xpress SARS-CoV-2/FLU/RSV plus assay is intended as an aid in the diagnosis of influenza from Nasopharyngeal swab specimens and should not be used as a sole basis for treatment. Nasal washings and aspirates are unacceptable for Xpert Xpress SARS-CoV-2/FLU/RSV testing.  Fact Sheet for Patients: BloggerCourse.com  Fact Sheet for Healthcare Providers: SeriousBroker.it  This test is not yet approved or cleared by the [place] FDA and has been authorized for detection and/or diagnosis of SARS-CoV-2 by FDA under an Emergency Use Authorization (EUA). This EUA will remain in effect (meaning this test can be used) for the duration of the COVID-19 declaration under Section 564(b)(1) of the Act, 21 U.S.C. section 360bbb-3(b)(1), unless the authorization is terminated or revoked.     Resp Syncytial Virus by PCR NEGATIVE NEGATIVE Final    Comment: (NOTE) Fact Sheet for Patients: BloggerCourse.com  Fact Sheet for Healthcare Providers: SeriousBroker.it  This test is not yet approved or cleared by the [place] FDA and has been authorized for detection and/or diagnosis of SARS-CoV-2 by FDA under an Emergency Use Authorization (EUA). This EUA will remain in effect (meaning this test can be used) for the duration of the COVID-19 declaration under Section 564(b)(1) of the Act, 21 U.S.C. section 360bbb-3(b)(1), unless the authorization is terminated or revoked.  Performed at [facility], [address] [place], [place] [address]      Radiology Studies: No results found.  Scheduled Meds:  amoxicillin-clavulanate  1 tablet Oral Q12H   vitamin C  1,000 mg Oral Daily   ciprofloxacin  2 drop Left EAR Q4H while awake   enoxaparin (LOVENOX) injection  40 mg Subcutaneous Q24H   mirtazapine  7.5 mg Oral QHS   Continuous Infusions:   LOS: 6 days   Time spent: 30 minutes  Carollee Herter, DO  [facility]  03/11/2023, 10:15 AM

## 2023-03-11 NOTE — Progress Notes (Signed)
Patient refused all medications and meal tray.  I have educated the patient on his medications, why they are prescribed and the importance on taking them as directed by the MD.  Patient also encouraged to eat something from his breakfast tray.  Patient declined any food preferences.  Will notify MD on rounds and continue to educate patient.

## 2023-03-12 DIAGNOSIS — U071 COVID-19: Secondary | ICD-10-CM | POA: Diagnosis not present

## 2023-03-12 DIAGNOSIS — R630 Anorexia: Secondary | ICD-10-CM | POA: Diagnosis not present

## 2023-03-12 DIAGNOSIS — E43 Unspecified severe protein-calorie malnutrition: Secondary | ICD-10-CM | POA: Insufficient documentation

## 2023-03-12 DIAGNOSIS — M6282 Rhabdomyolysis: Secondary | ICD-10-CM | POA: Diagnosis not present

## 2023-03-12 DIAGNOSIS — H66002 Acute suppurative otitis media without spontaneous rupture of ear drum, left ear: Secondary | ICD-10-CM | POA: Diagnosis not present

## 2023-03-12 LAB — CREATININE, SERUM
Creatinine, Ser: 1.01 mg/dL (ref 0.61–1.24)
GFR, Estimated: >60 mL/min (ref >60)

## 2023-03-12 NOTE — Plan of Care (Signed)
  Problem: Education: Goal: Knowledge of General Education information will improve Description: Including pain rating scale, medication(s)/side effects and non-pharmacologic comfort measures Outcome: Progressing   Problem: Health Behavior/Discharge Planning: Goal: Ability to manage health-related needs will improve Outcome: Progressing   Problem: Safety: Goal: Ability to remain free from injury will improve Outcome: Progressing   

## 2023-03-12 NOTE — Assessment & Plan Note (Signed)
03-12-2023 see RD noted from 03-11-2023 "Severe Malnutrition related to chronic illness (dementia) as evidenced by moderate fat depletion, severe fat depletion, moderate muscle depletion, severe muscle depletion. "

## 2023-03-12 NOTE — Progress Notes (Signed)
Occupational Therapy Treatment Patient Details Name: Drew Russell MRN: [id number] DOB: November 23, 1945 Today's Date: 03/12/2023   History of present illness Pt is a 78 year old male presented to the ED with AMS, admitted with non-traumatic rhabdomyolysis and Covid-19 infection  PMH significant for hypertension and dyslipidemia as well as dementia,   OT comments  Drew Russell was seen for OT treatment on this date. Upon arrival to room pt in bed with gown and purewick removed. Pt requires MAX A don gown, pt demonstrates appropriate strength/ROM but cognition and agitation limiting participation. SETUP self-drinking at bed level. RN in to address purewick. TOTAL A rolling however pt refuses and becomes agitated, attempts terminated. Pt making limited progress toward goals, will continue to follow POC. Discharge recommendation updated to reflect cognition impacting meaningful progress. Will attempt with +2 assist however may consider signing off.       If plan is discharge home, recommend the following:  A lot of help with walking and/or transfers;A lot of help with bathing/dressing/bathroom;Assistance with cooking/housework;Direct supervision/assist for medications management;Assist for transportation;Supervision due to cognitive status;Help with stairs or ramp for entrance;Direct supervision/assist for financial management   Equipment Recommendations  Other (comment) (defer)    Recommendations for Other Services      Precautions / Restrictions Precautions Precautions: Fall Restrictions Weight Bearing Restrictions Per Provider Order: No       Mobility Bed Mobility Overal bed mobility: Needs Assistance Bed Mobility: Rolling Rolling: Total assist         General bed mobility comments: pt refuses and becomes agitated, attempts terminated    Transfers                         Balance                                           ADL either performed or assessed  with clinical judgement   ADL Overall ADL's : Needs assistance/impaired                                       General ADL Comments: MAX A don gown, pt demonstrates appropriate strength/ROM but cognition and agitation limiting participation. SETUP self-drinking at bed level      Cognition Arousal: Alert Behavior During Therapy: Agitated Overall Cognitive Status: No family/caregiver present to determine baseline cognitive functioning                                                     Pertinent Vitals/ Pain       Pain Assessment Pain Assessment: No/denies pain   Frequency  Min 1X/week        Progress Toward Goals  OT Goals(current goals can now be found in the care plan section)  Progress towards OT goals: Progressing toward goals  Acute Rehab OT Goals OT Goal Formulation: With patient Time For Goal Achievement: 03/20/23 Potential to Achieve Goals: Good ADL Goals Pt Will Perform Grooming: with supervision;sitting Pt Will Perform Lower Body Dressing: with supervision;sit to/from stand;sitting/lateral leans Pt Will Transfer to Toilet: with supervision;ambulating Pt Will Perform Toileting - Clothing Manipulation and hygiene:  with supervision;sitting/lateral leans;sit to/from stand  Plan      Co-evaluation                 AM-PAC OT "6 Clicks" Daily Activity     Outcome Measure   Help from another person eating meals?: None Help from another person taking care of personal grooming?: A Little Help from another person toileting, which includes using toliet, bedpan, or urinal?: A Lot Help from another person bathing (including washing, rinsing, drying)?: A Lot Help from another person to put on and taking off regular upper body clothing?: A Lot Help from another person to put on and taking off regular lower body clothing?: A Lot 6 Click Score: 15    End of Session    OT Visit Diagnosis: Other abnormalities of gait and  mobility (R26.89);Muscle weakness (generalized) (M62.81)   Activity Tolerance Treatment limited secondary to agitation   Patient Left in bed;with call bell/phone within reach;with bed alarm set;with nursing/sitter in room   Nurse Communication          Time: 1330-1340 OT Time Calculation (min): 10 min  Charges: OT General Charges $OT Visit: 1 Visit OT Treatments $Self Care/Home Management : 8-22 mins  Kathie Dike, M.S. OTR/L  03/12/23, 2:15 PM  ascom [phone]

## 2023-03-12 NOTE — Progress Notes (Signed)
PROGRESS NOTE    Drew Russell  OZD:[id number] DOB: March 02, 1945 DOA: 03/04/2023 PCP: Larae Grooms, NP  Subjective: Pt seen and examined. On RA. I was able to find an otoscope at my home and bring it in to examine pt's left ear  RD saw patient. Diet texture modified. Remains afebrile.  Still refusing po abx and ear drops.  Awaiting TOC to find pt a SNF.   Hospital Course: HPI: Drew Russell is a 78 y.o. Caucasian male with medical history significant for hypertension and dyslipidemia as well as dementia, presented to the emergency room with acute onset of altered mental status and fall.  Apparently fell at home yesterday and his brother was feeding him while he was on the floor waiting for him to get enough energy to get up.  He denied any chest pain or palpitations earlier.  No cough or wheezing or dyspnea.  No neck pain or stiffness.  No fever or chills.  He depleted and dehydrated with cracked lips and dry mucous membranes.  he was very somnolent during my interview and only responsive to painful stimuli and therefore no further history could be obtained.   Significant Events: Admitted 03/04/2023 non-traumatic rhabdomyolysis and Covid-19 infection   Significant Labs: BUN of 34 with a creatinine of 1.45 close to previous levels. Calcium was 8.7 and AST was 61 with total bili 1.6 with otherwise unremarkable CMP. Total CK was 1041 and high sensitive troponin was 36. CBC showed anemia with hemoglobin of 12.3 and hematocrit 36.3 better than previous levels and thrombocytopenia of 143 compared to 185 in October last year. Lactic acid is 1.2. COVID-19 PCR was POSITIVE and the rest of the respiratory panel came back negative.   Significant Imaging Studies: Noncontrasted head CT scan revealed no acute ventricular abnormalities.  Portable chest x-ray showed no acute cardiopulmonary disease.   Antibiotic Therapy: Anti-infectives (From admission, onward)    Start     Dose/Rate Route  Frequency Ordered Stop   03/05/23 0130  vancomycin (VANCOREADY) IVPB 1250 mg/250 mL        1,250 mg 166.7 mL/hr over 90 Minutes Intravenous  Once 03/05/23 0127 03/05/23 0311   03/05/23 0015  ceFEPIme (MAXIPIME) 2 g in sodium chloride 0.9 % 100 mL IVPB        2 g 200 mL/hr over 30 Minutes Intravenous  Once 03/05/23 0009 03/05/23 0128       Procedures:   Consultants:     Assessment and Plan: * Non-traumatic rhabdomyolysis 03-06-2023 CK has come down nicely. No further need for IVF. Can potentially go home if pt's family can be located and will take him back. Will need PT/OT assessment. 03-07-2023 CK down to 478. No need to continue checking it. Needs SNF placement.  03-08-2023 resolved.  Anorexia 03-10-2023 add low dose remeron to help with his appetite. 7.5 mg at bedtime. 03-11-2023 pt refused remeron dose last night.  03-12-2023 continues to refuse remeron. RD modified pt's diet texture. Continue to offer protein shakes, ice cream, etc. I suspect that pt's diet is mostly processed foods and that the hospital diet is actually more nutritious than what he is used to eating at home.  Hypokalemia 03-09-2023 resolved with po kcl 40 meq x 2 doses.  Acute exudative otitis media of left ear 03-09-2023 start po augmentin 875 mg bid. Will try and bring my own personal otoscope from home as medical floor does not have an otoscope to use. 03-10-2023 movement of left pinna does not elicit  pain. Palpation of left tragus of eye still tender. Small adenopathy anterior to left tragus. Continue augmentin bid. 03-11-2023 pt refusing to take po augmentin. Only partially compliant with cipro ear drops. Pt is afebrile.   03-12-2023 left EAC occluded with ear wax and appears to be dried blood. Still tender at left tragus. Continue with ear drops and po augmentin if he will allow it.  COVID-19 virus infection 03-06-2023 continue supportive care. On RA. 03-07-2023 stable. On RA. 03-08-2023 stable.  On RA. 03-09-2023 stable.  03-12-2023 stable. On RA.  Acute metabolic encephalopathy 03-06-2023  This is likely associated with COVID-19 and his dementia together with acute rhabdomyolysis. UA negative for infection. 03-07-2023 resolved. Pt seems confused at baseline.  03-08-2023 resolved  Dyslipidemia 03-06-2023 Will hold statin therapy given his rhabdomyolysis. 03-07-2023 stable. Restart statin at discharge. 03-08-2023 stable  03-09-2023 stable.   Hypotension due to hypovolemia On admission, Likely due to lack po liquid intake. Pt is not septic. Stop midodrine. Monitor BP. 03-07-2023 resolved. BP stable.  03-08-2023 resolved.  Protein-calorie malnutrition, severe 03-12-2023 see RD noted from 03-11-2023 "Severe Malnutrition related to chronic illness (dementia) as evidenced by moderate fat depletion, severe fat depletion, moderate muscle depletion, severe muscle depletion. "  Pancytopenia (HCC) 03-06-2023 leukopenia and thrombocytopenia likely due to his covid-19. Pt was already anemia prior to admission. Pancytopenia not from any known cancer. 03-07-2023 stable. Will repeat CBC at discharge. 03-08-2023 stable 03-09-2023 resolved. has anemia. Plt count improved to 145K. WBC now 5.5  Anemia in chronic kidney disease (CKD) 03-06-2023 chronic anemia. 03-07-2023 stable. 03-08-2023 stable 03-09-2023 stable. HgB 11.2 g/dl  CKD stage 3a, GFR 16-10 ml/min (HCC) - baseline scR 1.4-1.6 03-06-2023 Scr slightly above his baseline of 1.4-1.6 03-07-2023 Today, BUN 28, Scr 1.28. stable now. 03-08-2023 stable  03-09-2023 stable. Scr 1.12, BUN 29   DVT prophylaxis: enoxaparin (LOVENOX) injection 40 mg Start: 03/05/23 0800    Code Status: Full Code Family Communication: no family at bedside Disposition Plan: SNF placement Reason for continuing need for hospitalization: medically stable for discharge.  Objective: Vitals:   03/10/23 2345 03/11/23 0947 03/11/23 1644 03/12/23 0941   BP: 131/72 114/60 (!) 125/49 132/70  Pulse: 88 71 71 83  Resp:  18 16 18   Temp:  98 F (36.7 C) 98 F (36.7 C) 98.7 F (37.1 C)  TempSrc:   Oral Axillary  SpO2:  100% 100% 100%  Weight:        Intake/Output Summary (Last 24 hours) at 03/12/2023 1036 Last data filed at 03/12/2023 0941 Gross per 24 hour  Intake --  Output 350 ml  Net -350 ml   Filed Weights   03/05/23 0023  Weight: 59 kg    Examination:  Physical Exam Vitals and nursing note reviewed.  Constitutional:      General: He is not in acute distress.    Appearance: He is not toxic-appearing or diaphoretic.  HENT:     Head: Normocephalic.      Comments: Left EAC occluded with ear wax and possible dried blood. No purulent drainage noted. Still tender left tragus Neurological:     Mental Status: He is alert.     Data Reviewed: I have personally reviewed following labs and imaging studies  CBC: Recent Labs  Lab 03/09/23 0557  WBC 5.5  NEUTROABS 4.0  HGB 11.2*  HCT 32.1*  MCV 95.8  PLT 145*   Basic Metabolic Panel: Recent Labs  Lab 03/07/23 0524 03/09/23 0557 03/12/23 0555  NA 141 145  --  K 3.3* 3.3*  --   CL 106 111  --   CO2 27 26  --   GLUCOSE 95 102*  --   BUN 28* 29*  --   CREATININE 1.28* 1.12 1.01  CALCIUM 8.1* 8.1*  --    GFR: Estimated Creatinine Clearance: 49.5 mL/min (by C-G formula based on SCr of 1.01 mg/dL). Liver Function Tests: Recent Labs  Lab 03/07/23 0524 03/09/23 0557  AST 59* 34  ALT 24 19  ALKPHOS 42 39  BILITOT 1.0 1.4*  PROT 5.7* 5.4*  ALBUMIN 3.1* 2.9*   Cardiac Enzymes: Recent Labs  Lab 03/06/23 0241 03/07/23 0524 03/08/23 0752 03/09/23 0557  CKTOTAL 585* 478* 224 140   Recent Results (from the past 240 hours)  Blood Culture (routine x 2)     Status: None   Collection Time: 03/04/23 11:51 PM   Specimen: BLOOD  Result Value Ref Range Status   Specimen Description BLOOD LEFT ARM  Final   Special Requests   Final    BOTTLES DRAWN AEROBIC AND  ANAEROBIC Blood Culture results may not be optimal due to an inadequate volume of blood received in culture bottles   Culture   Final    NO GROWTH 5 DAYS Performed at [facility], [address], [place], [place] [address]    Report Status 03/10/2023 FINAL  Final  Blood Culture (routine x 2)     Status: None   Collection Time: 03/05/23 12:17 AM   Specimen: BLOOD  Result Value Ref Range Status   Specimen Description BLOOD LEFT FOREARM  Final   Special Requests   Final    BOTTLES DRAWN AEROBIC AND ANAEROBIC Blood Culture adequate volume   Culture   Final    NO GROWTH 5 DAYS Performed at [facility], [address] [place], [place] [address]    Report Status 03/10/2023 FINAL  Final  Resp panel by RT-PCR (RSV, Flu A&B, Covid) Anterior Nasal Swab     Status: Abnormal   Collection Time: 03/05/23 12:17 AM   Specimen: Anterior Nasal Swab  Result Value Ref Range Status   SARS Coronavirus 2 by RT PCR POSITIVE (A) NEGATIVE Final    Comment: (NOTE) SARS-CoV-2 target nucleic acids are DETECTED.  The SARS-CoV-2 RNA is generally detectable in upper respiratory specimens during the acute phase of infection. Positive results are indicative of the presence of the identified virus, but do not rule out bacterial infection or co-infection with other pathogens not detected by the test. Clinical correlation with patient history and other diagnostic information is necessary to determine patient infection status. The expected result is Negative.  Fact Sheet for Patients: BloggerCourse.com  Fact Sheet for Healthcare Providers: SeriousBroker.it  This test is not yet approved or cleared by the [place] FDA and  has been authorized for detection and/or diagnosis of SARS-CoV-2 by FDA under an Emergency Use Authorization (EUA).  This EUA will remain in effect (meaning this test can be used) for the duration of  the COVID-19  declaration under Section 564(b)(1) of the A ct, 21 U.S.C. section 360bbb-3(b)(1), unless the authorization is terminated or revoked sooner.     Influenza A by PCR NEGATIVE NEGATIVE Final   Influenza B by PCR NEGATIVE NEGATIVE Final    Comment: (NOTE) The Xpert Xpress SARS-CoV-2/FLU/RSV plus assay is intended as an aid in the diagnosis of influenza from Nasopharyngeal swab specimens and should not be used as a sole basis for treatment. Nasal washings and aspirates are  unacceptable for Xpert Xpress SARS-CoV-2/FLU/RSV testing.  Fact Sheet for Patients: BloggerCourse.com  Fact Sheet for Healthcare Providers: SeriousBroker.it  This test is not yet approved or cleared by the [place] FDA and has been authorized for detection and/or diagnosis of SARS-CoV-2 by FDA under an Emergency Use Authorization (EUA). This EUA will remain in effect (meaning this test can be used) for the duration of the COVID-19 declaration under Section 564(b)(1) of the Act, 21 U.S.C. section 360bbb-3(b)(1), unless the authorization is terminated or revoked.     Resp Syncytial Virus by PCR NEGATIVE NEGATIVE Final    Comment: (NOTE) Fact Sheet for Patients: BloggerCourse.com  Fact Sheet for Healthcare Providers: SeriousBroker.it  This test is not yet approved or cleared by the [place] FDA and has been authorized for detection and/or diagnosis of SARS-CoV-2 by FDA under an Emergency Use Authorization (EUA). This EUA will remain in effect (meaning this test can be used) for the duration of the COVID-19 declaration under Section 564(b)(1) of the Act, 21 U.S.C. section 360bbb-3(b)(1), unless the authorization is terminated or revoked.  Performed at [facility], [address] [place], [place] [address]      Radiology Studies: No results found.  Scheduled Meds:   amoxicillin-clavulanate  1 tablet Oral Q12H   vitamin C  1,000 mg Oral Daily   ciprofloxacin  2 drop Left EAR Q4H while awake   enoxaparin (LOVENOX) injection  40 mg Subcutaneous Q24H   feeding supplement  237 mL Oral BID BM   mirtazapine  7.5 mg Oral QHS   multivitamin with minerals  1 tablet Oral Daily   Continuous Infusions:   LOS: 7 days   Time spent: 40 minutes  Carollee Herter, DO  [facility]  03/12/2023, 10:36 AM

## 2023-03-13 DIAGNOSIS — E43 Unspecified severe protein-calorie malnutrition: Secondary | ICD-10-CM

## 2023-03-13 DIAGNOSIS — E876 Hypokalemia: Secondary | ICD-10-CM | POA: Diagnosis not present

## 2023-03-13 DIAGNOSIS — M6282 Rhabdomyolysis: Secondary | ICD-10-CM | POA: Diagnosis not present

## 2023-03-13 DIAGNOSIS — H66002 Acute suppurative otitis media without spontaneous rupture of ear drum, left ear: Secondary | ICD-10-CM | POA: Diagnosis not present

## 2023-03-13 DIAGNOSIS — U071 COVID-19: Secondary | ICD-10-CM | POA: Diagnosis not present

## 2023-03-13 NOTE — Plan of Care (Signed)
  Problem: Respiratory: Goal: Will maintain a patent airway Outcome: Progressing   Problem: Education: Goal: Knowledge of General Education information will improve Description: Including pain rating scale, medication(s)/side effects and non-pharmacologic comfort measures Outcome: Progressing   Problem: Clinical Measurements: Goal: Will remain free from infection Outcome: Progressing

## 2023-03-13 NOTE — Plan of Care (Signed)
  Problem: Education: Goal: Knowledge of risk factors and measures for prevention of condition will improve Outcome: Progressing   Problem: Coping: Goal: Psychosocial and spiritual needs will be supported Outcome: Progressing   Problem: Respiratory: Goal: Will maintain a patent airway Outcome: Progressing   

## 2023-03-13 NOTE — Progress Notes (Signed)
1000 Pt still refusing to eat only accepting sips of water. Refuses meals and meds. MD aware  1050 Updated brother Gerlene Burdock of pts care. Brother would like to speak to hospitalist about d/c pt home via taxi. MD and CM made aware.

## 2023-03-13 NOTE — Progress Notes (Signed)
Progress Note   Patient: Drew Russell AVW:[id number] DOB: 1945/06/16 DOA: 03/04/2023     8 DOS: the patient was seen and examined on 03/13/2023   Brief hospital course: HPI: Drew Russell is a 78 y.o. Caucasian male with medical history significant for hypertension and dyslipidemia as well as dementia, presented to the emergency room with acute onset of altered mental status and fall.  Apparently fell at home yesterday and his brother was feeding him while he was on the floor waiting for him to get enough energy to get up.  He denied any chest pain or palpitations earlier.  No cough or wheezing or dyspnea.  No neck pain or stiffness.  No fever or chills.  He depleted and dehydrated with cracked lips and dry mucous membranes.  he was very somnolent during my interview and only responsive to painful stimuli and therefore no further history could be obtained.   Assessment and Plan: * Non-traumatic rhabdomyolysis CK trended down.  Encourage oral fluids.  Hypokalemia Resolved.  Acute exudative otitis media of left ear Continue with ear drops and po augmentin if he will allow it.  COVID-19 virus infection Continue isolation protocol. Supportive care. He is not hypoxic.  Acute metabolic encephalopathy Pt seems confused at baseline. Fall, aspiration precautions. Delirium precautions.  Dyslipidemia Will resume statin therapy.  Hypotension due to hypovolemia BP stable. Now off midodrine.  Protein-calorie malnutrition, severe Anorexia Continue Remeron Encourage oral diet, supplements. Assist with feeding.  Pancytopenia (HCC) Due to COVID. Counts improving.  Anemia in chronic kidney disease (CKD) HgB stable around 11. No active bleeding.  CKD stage 3a, GFR 45-59 ml/min (HCC) - baseline scR 1.4-1.6 Stable. Avoid nephrotoxic drugs.     Out of bed to chair. Incentive spirometry. Nursing supportive care. Fall, aspiration precautions. DVT prophylaxis   Code Status: Full  Code  Subjective: Patient is seen and examined today morning. He is lethargic, trying remove clothes. Eating poor. Has no pain, shortness of breath. Did not get out of bed. Advised to work with PT.  Physical Exam: Vitals:   03/12/23 1738 03/12/23 2226 03/13/23 0741 03/13/23 1548  BP: 130/68 115/72 119/81 (!) 118/97  Pulse: 80 76 80 88  Resp: 16 16    Temp: 98.8 F (37.1 C) 97.9 F (36.6 C) 98.3 F (36.8 C) 99 F (37.2 C)  TempSrc: Oral Oral Oral   SpO2: 100% 98% 97% 98%  Weight:        General - Elderly ill Caucasian male, no apparent distress HEENT - PERRLA, EOMI, atraumatic head, non tender sinuses. Lung - Clear, basal rales, rhonchi, no wheezes. Heart - S1, S2 heard, no murmurs, rubs, trace pedal edema. Abdomen - Soft, non tender, bowel sounds good Neuro - Alert, awake, slow mentation , non focal exam. Skin - Warm and dry.  Data Reviewed:      Latest Ref Rng & Units 03/09/2023    5:57 AM 03/05/2023    3:20 AM 03/04/2023    3:51 PM  CBC  WBC 4.0 - 10.5 K/uL 5.5  3.1  4.6   Hemoglobin 13.0 - 17.0 g/dL 82.9  56.2  13.0   Hematocrit 39.0 - 52.0 % 32.1  29.9  36.3   Platelets 150 - 400 K/uL 145  109  143       Latest Ref Rng & Units 03/12/2023    5:55 AM 03/09/2023    5:57 AM 03/07/2023    5:24 AM  BMP  Glucose 70 - 99 mg/dL  102  95   BUN 8 - 23 mg/dL  29  28   Creatinine 9.62 - 1.24 mg/dL 9.52  8.41  3.24   Sodium 135 - 145 mmol/L  145  141   Potassium 3.5 - 5.1 mmol/L  3.3  3.3   Chloride 98 - 111 mmol/L  111  106   CO2 22 - 32 mmol/L  26  27   Calcium 8.9 - 10.3 mg/dL  8.1  8.1    No results found.  Family Communication: Discussed with brother over phone who want to take him home even after explaining that he is weak and not eating well. All questions answereed.  Disposition: Status is: Inpatient Remains inpatient appropriate because: weak, poor appetite, need rehab  Planned Discharge Destination: Rehab     Time spent: 37  minutes  Author: Marcelino Duster, MD 03/13/2023 4:07 PM Secure chat 7am to 7pm For on call review [email or web address].

## 2023-03-14 DIAGNOSIS — E876 Hypokalemia: Secondary | ICD-10-CM | POA: Diagnosis not present

## 2023-03-14 DIAGNOSIS — H66002 Acute suppurative otitis media without spontaneous rupture of ear drum, left ear: Secondary | ICD-10-CM | POA: Diagnosis not present

## 2023-03-14 DIAGNOSIS — U071 COVID-19: Secondary | ICD-10-CM | POA: Diagnosis not present

## 2023-03-14 DIAGNOSIS — M6282 Rhabdomyolysis: Secondary | ICD-10-CM | POA: Diagnosis not present

## 2023-03-14 LAB — CBC
HCT: 35.7 % — ABNORMAL LOW (ref 39.0–52.0)
Hemoglobin: 12.2 g/dL — ABNORMAL LOW (ref 13.0–17.0)
MCH: 33 pg (ref 26.0–34.0)
MCHC: 34.2 g/dL (ref 30.0–36.0)
MCV: 96.5 fL (ref 80.0–100.0)
Platelets: 258 10*3/uL (ref 150–400)
RBC: 3.7 MIL/uL — ABNORMAL LOW (ref 4.22–5.81)
RDW: 14.4 % (ref 11.5–15.5)
WBC: 8.4 10*3/uL (ref 4.0–10.5)
nRBC: 0 % (ref 0.0–0.2)

## 2023-03-14 LAB — BASIC METABOLIC PANEL
Anion gap: 12 (ref 5–15)
BUN: 54 mg/dL — ABNORMAL HIGH (ref 8–23)
CO2: 27 mmol/L (ref 22–32)
Calcium: 8.5 mg/dL — ABNORMAL LOW (ref 8.9–10.3)
Chloride: 118 mmol/L — ABNORMAL HIGH (ref 98–111)
Creatinine, Ser: 1.63 mg/dL — ABNORMAL HIGH (ref 0.61–1.24)
GFR, Estimated: 43 mL/min — ABNORMAL LOW (ref >60)
Glucose, Bld: 104 mg/dL — ABNORMAL HIGH (ref 70–99)
Potassium: 3.7 mmol/L (ref 3.5–5.1)
Sodium: 157 mmol/L — ABNORMAL HIGH (ref 135–145)

## 2023-03-14 LAB — GLUCOSE, CAPILLARY
Glucose-Capillary: 136 mg/dL — ABNORMAL HIGH (ref 70–99)
Glucose-Capillary: 85 mg/dL (ref 70–99)

## 2023-03-14 MED ORDER — DEXTROSE-SODIUM CHLORIDE 5-0.45 % IV SOLN
INTRAVENOUS | Status: AC
Start: 2023-03-14 — End: 2023-03-15

## 2023-03-14 NOTE — Progress Notes (Signed)
PT Cancellation Note  Patient Details Name: Drew Russell MRN: [id number] DOB: 03/04/1945   Cancelled Treatment:    Reason Eval/Treat Not Completed: Other (comment). Treatment attempted however pt unable to participate due to confusion/agitation. Pt randomly started crying throughout session and resisted PT movement of B LEs. Pt refuses to remove covers. Poor progression with therapy throughout the past few sessions. Due to inconsistent participation and limited progress towards goals, will dc in house. Currently recommend LTC without therapy services.   [name] 03/14/2023, 2:25 PM

## 2023-03-14 NOTE — Plan of Care (Signed)
He is still refusing majority of his care including medications and food. He did not eat any meal throughout shift. He allowed vitals and blood sugar to be obtained around 6pm which was WDL. No bowel movements. Problem: Education: Goal: Knowledge of risk factors and measures for prevention of condition will improve Outcome: Not Progressing   Problem: Coping: Goal: Psychosocial and spiritual needs will be supported Outcome: Not Progressing   Problem: Respiratory: Goal: Will maintain a patent airway Outcome: Not Progressing Goal: Complications related to the disease process, condition or treatment will be avoided or minimized Outcome: Not Progressing   Problem: Education: Goal: Knowledge of General Education information will improve Description: Including pain rating scale, medication(s)/side effects and non-pharmacologic comfort measures Outcome: Not Progressing   Problem: Health Behavior/Discharge Planning: Goal: Ability to manage health-related needs will improve Outcome: Not Progressing   Problem: Clinical Measurements: Goal: Ability to maintain clinical measurements within normal limits will improve Outcome: Not Progressing Goal: Will remain free from infection Outcome: Not Progressing Goal: Diagnostic test results will improve Outcome: Not Progressing Goal: Respiratory complications will improve Outcome: Not Progressing Goal: Cardiovascular complication will be avoided Outcome: Not Progressing   Problem: Activity: Goal: Risk for activity intolerance will decrease Outcome: Not Progressing   Problem: Nutrition: Goal: Adequate nutrition will be maintained Outcome: Not Progressing   Problem: Coping: Goal: Level of anxiety will decrease Outcome: Not Progressing   Problem: Elimination: Goal: Will not experience complications related to bowel motility Outcome: Not Progressing Goal: Will not experience complications related to urinary retention Outcome: Not  Progressing   Problem: Pain Managment: Goal: General experience of comfort will improve and/or be controlled Outcome: Not Progressing   Problem: Safety: Goal: Ability to remain free from injury will improve Outcome: Not Progressing   Problem: Skin Integrity: Goal: Risk for impaired skin integrity will decrease Outcome: Not Progressing

## 2023-03-14 NOTE — Progress Notes (Signed)
Progress Note   Patient: Drew Russell GEX:[id number] DOB: 03/10/1945 DOA: 03/04/2023     9 DOS: the patient was seen and examined on 03/14/2023   Brief hospital course: HPI: Drew Russell is a 78 y.o. Caucasian male with medical history significant for hypertension and dyslipidemia as well as dementia, presented to the emergency room with acute onset of altered mental status and fall.  Apparently fell at home yesterday and his brother was feeding him while he was on the floor waiting for him to get enough energy to get up.  He denied any chest pain or palpitations earlier.  No cough or wheezing or dyspnea.  No neck pain or stiffness.  No fever or chills.  He depleted and dehydrated with cracked lips and dry mucous membranes.  he was very somnolent during my interview and only responsive to painful stimuli and therefore no further history could be obtained.   During hospital stay, his CK level, potassium improved, patient is 10 days past COVID virus infection, not hypoxic.  Patient seems to be confused at baseline and does not wish to eat or get out of bed.  Patient is severely malnourished with moderate fat depletion, weight loss.  Patient's brother wishes to take him home even though I explained him about his current condition.  Assessment and Plan: * Non-traumatic rhabdomyolysis Hypokalemia Acute metabolic encephalopathy Poor oral intake, anorexia Severe protein calorie malnutrition Acute on chronic kidney disease stage III. Patient is very weak, eating poor. CK trended down.  Potassium improved. Kidney injury in the setting of poor oral intake. I advised IV fluids.  RN told me that he did not wish IV line.  Does not wish to take medications. I placed consult for palliative care team to discuss goals of care.  Acute exudative otitis media of left ear Continue with ear drops and po augmentin if he will allow it.  COVID-19 virus infection Continue isolation protocol. Supportive  care. He is not hypoxic.  Dyslipidemia Will resume statin therapy.  Hypotension due to hypovolemia BP stable. Now off midodrine.  Protein-calorie malnutrition, severe Anorexia Continue Remeron Encourage oral diet, supplements. Assist with feeding.  Pancytopenia (HCC) Due to COVID. Counts improving.  Anemia in chronic kidney disease (CKD) HgB stable around 11. No active bleeding.  CKD stage 3a, GFR 45-59 ml/min (HCC) - baseline scR 1.4-1.6 Stable. Avoid nephrotoxic drugs.     Out of bed to chair. Incentive spirometry. Nursing supportive care. Fall, aspiration precautions. DVT prophylaxis   Code Status: Full Code  Subjective: Patient is seen and examined today morning. He is alert, does not wish to talk. Wishes to go home. He is eating and drinking poor. Does not wish to get IV. SLP eval, palliative team consulted.  Physical Exam: Vitals:   03/12/23 2226 03/13/23 0741 03/13/23 1548 03/14/23 0029  BP: 115/72 119/81 (!) 118/97 120/86  Pulse: 76 80 88 85  Resp: 16   18  Temp: 97.9 F (36.6 C) 98.3 F (36.8 C) 99 F (37.2 C) 98.8 F (37.1 C)  TempSrc: Oral Oral    SpO2: 98% 97% 98% 97%  Weight:        General - Elderly ill Caucasian male, restless in bed, on and off crying. HEENT - PERRLA, EOMI, atraumatic head, non tender sinuses. Lung - Clear, basal rales, rhonchi, no wheezes. Heart - S1, S2 heard, no murmurs, rubs, trace pedal edema. Abdomen - Soft, non tender, bowel sounds good Neuro - Alert, awake, slow mentation , non focal  exam. Skin - Warm and dry.  Data Reviewed:      Latest Ref Rng & Units 03/14/2023    8:38 AM 03/09/2023    5:57 AM 03/05/2023    3:20 AM  CBC  WBC 4.0 - 10.5 K/uL 8.4  5.5  3.1   Hemoglobin 13.0 - 17.0 g/dL 81.1  91.4  78.2   Hematocrit 39.0 - 52.0 % 35.7  32.1  29.9   Platelets 150 - 400 K/uL 258  145  109       Latest Ref Rng & Units 03/14/2023    8:38 AM 03/12/2023    5:55 AM 03/09/2023    5:57 AM  BMP  Glucose 70 - 99  mg/dL 956   213   BUN 8 - 23 mg/dL 54   29   Creatinine 0.86 - 1.24 mg/dL 5.78  4.69  6.29   Sodium 135 - 145 mmol/L 157   145   Potassium 3.5 - 5.1 mmol/L 3.7   3.3   Chloride 98 - 111 mmol/L 118   111   CO2 22 - 32 mmol/L 27   26   Calcium 8.9 - 10.3 mg/dL 8.5   8.1    No results found.  Family Communication: Discussed with brother over phone who want to take him home even after explaining that he is weak and not eating well. All questions answereed. Advised palliative care.  Disposition: Status is: Inpatient Remains inpatient appropriate because: weak, poor appetite, need rehab, palliative care consult.  Planned Discharge Destination: Rehab     Time spent: 37 minutes  Author: Marcelino Duster, MD 03/14/2023 2:44 PM Secure chat 7am to 7pm For on call review [email or web address].

## 2023-03-14 NOTE — Progress Notes (Addendum)
Nutrition Follow-up  DOCUMENTATION CODES:   Severe malnutrition in context of chronic illness  INTERVENTION:   -Continue dysphagia 1 diet -Continue MVI with minerals daily -Continue Ensure Enlive po BID, each supplement provides 350 kcal and 20 grams of protein.  -Continue Magic cup TID with meals, each supplement provides 290 kcal and 9 grams of protein  -Case discussed with MD and RN; discussed concerns with poor po's and cognition; recommended SLP eval and/or goals of care discussions; MD agrees with palliative care consult  NUTRITION DIAGNOSIS:   Severe Malnutrition related to chronic illness (dementia) as evidenced by moderate fat depletion, severe fat depletion, moderate muscle depletion, severe muscle depletion.  Ongoing  GOAL:   Patient will meet greater than or equal to 90% of their needs  Unmet  MONITOR:   PO intake, Supplement acceptance, Diet advancement  REASON FOR ASSESSMENT:   Consult Assessment of nutrition requirement/status  ASSESSMENT:   Pt with medical history significant for hypertension and dyslipidemia as well as dementia, presented with acute onset of altered mental status and fall.  Reviewed I/O's: +10 ml x 24 hours and +30 ml since admission  Pt lying in bed at time of visit. He did not arouse to name being called. Noted breakfast tray at bedside, untouched.   Per RN, pt is refusing food and medications.   Pt with poor participation with acute rehab, cognition, confusion, and agitation limiting factors.   Per MD notes, TOC working on SNF placement, but brother wants to take pt home.   Concerns discussed with MD at last visit, however, MD did not think that cognition was an issue regarding oral intake. Pt with poor dentition and RD downgraded diet for ease of intake. Pt continues to refuse foods and medications. RD again discussed concerns with RN and MD; recommended potential for SLP eval for swallow/ cognition as well as goals of care  discussions.   Medications reviewed and include vitamin C and remeron.   Labs reviewed: Na: 157.   Diet Order:   Diet Order             DIET - DYS 1 Room service appropriate? Yes; Fluid consistency: Thin  Diet effective now                   EDUCATION NEEDS:   Not appropriate for education at this time  Skin:  Skin Assessment: Reviewed RN Assessment  Last BM:  03/07/23  Height:   Ht Readings from Last 1 Encounters:  12/03/22 5' 5.5" (1.664 m)    Weight:   Wt Readings from Last 1 Encounters:  03/05/23 59 kg    Ideal Body Weight:  60.2 kg  BMI:  Body mass index is 21.3 kg/m.  Estimated Nutritional Needs:   Kcal:  1750-1950  Protein:  90-105 grams  Fluid:  > 1.7 L    Levada Schilling, RD, LDN, CDCES Registered Dietitian III Certified Diabetes Care and Education Specialist If unable to reach this RD, please use "RD Inpatient" group chat on secure chat between hours of 8am-4 pm daily

## 2023-03-14 NOTE — Plan of Care (Signed)
  Problem: Respiratory: Goal: Will maintain a patent airway Outcome: Progressing   Problem: Clinical Measurements: Goal: Will remain free from infection Outcome: Progressing

## 2023-03-15 DIAGNOSIS — E876 Hypokalemia: Secondary | ICD-10-CM | POA: Diagnosis not present

## 2023-03-15 DIAGNOSIS — H66002 Acute suppurative otitis media without spontaneous rupture of ear drum, left ear: Secondary | ICD-10-CM | POA: Diagnosis not present

## 2023-03-15 DIAGNOSIS — M6282 Rhabdomyolysis: Secondary | ICD-10-CM | POA: Diagnosis not present

## 2023-03-15 DIAGNOSIS — U071 COVID-19: Secondary | ICD-10-CM | POA: Diagnosis not present

## 2023-03-15 DIAGNOSIS — N179 Acute kidney failure, unspecified: Secondary | ICD-10-CM | POA: Diagnosis present

## 2023-03-15 LAB — BASIC METABOLIC PANEL
Anion gap: 12 (ref 5–15)
BUN: 57 mg/dL — ABNORMAL HIGH (ref 8–23)
CO2: 28 mmol/L (ref 22–32)
Calcium: 8.4 mg/dL — ABNORMAL LOW (ref 8.9–10.3)
Chloride: 118 mmol/L — ABNORMAL HIGH (ref 98–111)
Creatinine, Ser: 1.59 mg/dL — ABNORMAL HIGH (ref 0.61–1.24)
GFR, Estimated: 44 mL/min — ABNORMAL LOW (ref >60)
Glucose, Bld: 160 mg/dL — ABNORMAL HIGH (ref 70–99)
Potassium: 2.9 mmol/L — ABNORMAL LOW (ref 3.5–5.1)
Sodium: 158 mmol/L — ABNORMAL HIGH (ref 135–145)

## 2023-03-15 LAB — GLUCOSE, CAPILLARY
Glucose-Capillary: 101 mg/dL — ABNORMAL HIGH (ref 70–99)
Glucose-Capillary: 121 mg/dL — ABNORMAL HIGH (ref 70–99)
Glucose-Capillary: 132 mg/dL — ABNORMAL HIGH (ref 70–99)

## 2023-03-15 MED ORDER — POTASSIUM CHLORIDE 10 MEQ/100ML IV SOLN
10.0000 meq | INTRAVENOUS | Status: AC
  Administered 2023-03-15 (×4): 10 meq via INTRAVENOUS
  Filled 2023-03-15 (×4): qty 100

## 2023-03-15 NOTE — TOC Progression Note (Addendum)
Transition of Care [facility]) - Progression Note    Patient Details  Name: Drew Russell MRN: [id number] Date of Birth: 1945-11-10  [facility] CM/SW Contact  Marlowe Sax, RN Phone Number: 03/15/2023, 12:30 PM  Clinical Narrative:      Wnt into the patient's room to discuss DC option, He is sleeping and does not want to be bothered, he shopws some confusion as well and does have Dementia  I tried calling sister listed as emergency contact on the chart [name]) I left a general VM for a call back, Palliative to see the patient      Expected Discharge Plan and Services                                               Social Determinants of Health (SDOH) Interventions SDOH Screenings   Food Insecurity: Patient Unable To Answer (03/08/2023)  Housing: Patient Unable To Answer (03/08/2023)  Transportation Needs: Patient Unable To Answer (03/08/2023)  Utilities: Patient Unable To Answer (03/08/2023)  Alcohol Screen: Low Risk  (08/08/2021)  Depression (PHQ2-9): Low Risk  (12/03/2022)  Financial Resource Strain: Low Risk  (08/08/2021)  Physical Activity: Insufficiently Active (08/08/2021)  Social Connections: Unknown (03/08/2023)  Stress: No Stress Concern Present (08/08/2021)  Tobacco Use: Medium Risk (12/03/2022)    Readmission Risk Interventions     No data to display

## 2023-03-15 NOTE — Progress Notes (Signed)
Progress Note   Patient: Drew Russell:[id number] DOB: 11/11/1945 DOA: 03/04/2023     10 DOS: the patient was seen and examined on 03/15/2023   Brief hospital course: HPI: Drew Russell is a 78 y.o. Caucasian male with medical history significant for hypertension and dyslipidemia as well as dementia, presented to the emergency room with acute onset of altered mental status and fall.  Apparently fell at home yesterday and his brother was feeding him while he was on the floor waiting for him to get enough energy to get up.  He denied any chest pain or palpitations earlier.  No cough or wheezing or dyspnea.  No neck pain or stiffness.  No fever or chills.  He depleted and dehydrated with cracked lips and dry mucous membranes.  he was very somnolent during my interview and only responsive to painful stimuli and therefore no further history could be obtained.   During hospital stay, his CK level, potassium improved, patient is 10 days past COVID virus infection, not hypoxic.  Patient seems to be confused at baseline and does not wish to eat or get out of bed.  Patient is severely malnourished with moderate fat depletion, weight loss.  Patient's brother wishes to take him home even though I explained him about his current condition. I consulted Palliative team for goals of care discussion. SLP advised pureed diet.  Assessment and Plan: * Non-traumatic rhabdomyolysis Acute metabolic encephalopathy Poor oral intake, anorexia Severe protein calorie malnutrition  Patient is very weak, eating poor. CK trended down.  Potassium improved. Kidney injury in the setting of poor oral intake. Continue gentle IV fluids with D5.  Palliative care team consult for goals of care.  Acute exudative otitis media of left ear Continue with ear drops and po augmentin if he will allow it.  COVID-19 virus infection Continue isolation protocol. Supportive care. He is not hypoxic.  Hypokalemia IV supplements  ordered. Pharmacy consulted.  Dyslipidemia On statin therapy.  Hypotension due to hypovolemia BP stable. Now off midodrine.  Protein-calorie malnutrition, severe Anorexia Continue Remeron Encourage oral diet, supplements. Assist with feeding.  Pancytopenia (HCC) Due to COVID. Counts improved.  Anemia in chronic kidney disease (CKD) HgB stable around 12. No active bleeding.  Acute on chronic kidney disease stage III. Worsening kidney function due to poor oral intake. Gentle IV fluids. Avoid nephrotoxic drugs.     Out of bed to chair. Incentive spirometry. Nursing supportive care. Fall, aspiration precautions. DVT prophylaxis   Code Status: Full Code  Subjective: Patient is seen and examined today morning. He is sleepy, confused. Has mittens for safety. Got IV line and fluids.  Physical Exam: Vitals:   03/14/23 1809 03/14/23 2020 03/15/23 0815 03/15/23 1522  BP: 106/70 (!) 145/71 (!) 124/52 (!) 136/43  Pulse: 87 76 83 81  Resp: 18 18 16 16   Temp:  97.6 F (36.4 C) (!) 97.5 F (36.4 C) 97.6 F (36.4 C)  TempSrc:   Oral   SpO2: 93% 93% 100% 98%  Weight:        General - Elderly ill Caucasian male, restless in bed, on and off crying. HEENT - PERRLA, EOMI, atraumatic head, non tender sinuses. Lung - Clear, basal rales, rhonchi, no wheezes. Heart - S1, S2 heard, no murmurs, rubs, trace pedal edema. Abdomen - Soft, non tender, bowel sounds good Neuro - Alert, awake, slow mentation , non focal exam. Skin - Warm and dry.  Data Reviewed:      Latest Ref Rng &  Units 03/14/2023    8:38 AM 03/09/2023    5:57 AM 03/05/2023    3:20 AM  CBC  WBC 4.0 - 10.5 K/uL 8.4  5.5  3.1   Hemoglobin 13.0 - 17.0 g/dL 09.8  11.9  14.7   Hematocrit 39.0 - 52.0 % 35.7  32.1  29.9   Platelets 150 - 400 K/uL 258  145  109       Latest Ref Rng & Units 03/15/2023   10:11 AM 03/14/2023    8:38 AM 03/12/2023    5:55 AM  BMP  Glucose 70 - 99 mg/dL 829  562    BUN 8 - 23 mg/dL 57   54    Creatinine 1.30 - 1.24 mg/dL 8.65  7.84  6.96   Sodium 135 - 145 mmol/L 158  157    Potassium 3.5 - 5.1 mmol/L 2.9  3.7    Chloride 98 - 111 mmol/L 118  118    CO2 22 - 32 mmol/L 28  27    Calcium 8.9 - 10.3 mg/dL 8.4  8.5     No results found.  Family Communication: Discussed with brother over phone who want to take him home even after explaining that he is weak and not eating well. All questions answereed. Pending palliative care consult.  Disposition: Status is: Inpatient Remains inpatient appropriate because: weak, poor appetite, need rehab, palliative care consult.  Planned Discharge Destination: Rehab     Time spent: 37 minutes  Author: Marcelino Duster, MD 03/15/2023 3:49 PM Secure chat 7am to 7pm For on call review [email or web address].

## 2023-03-15 NOTE — Evaluation (Signed)
Clinical/Bedside Swallow Evaluation Patient Details  Name: Drew Russell MRN: [id number] Date of Birth: 1945-10-14  Today's Date: 03/15/2023 Time: SLP Start Time (ACUTE ONLY): 1030 SLP Stop Time (ACUTE ONLY): 1130 SLP Time Calculation (min) (ACUTE ONLY): 60 min  Past Medical History:  Past Medical History:  Diagnosis Date   History of chicken pox    History of measles as a child    History of mumps as a child    Hyperlipidemia    Hypertension    Past Surgical History:  Past Surgical History:  Procedure Laterality Date   ANGIOPLASTY  05/21/1993   [facility]   Cardiac Catherterization     per pt had angioplast in 1995, no stent   CATARACT EXTRACTION Bilateral 2007   HPI:  Pt is a 78 y.o. Caucasian male with medical history significant for Dementia, hypertension and dyslipidemia, Malnourished, requring assist w/ ADLs, who presented to the emergency room with acute onset of altered mental status and fall.  Apparently fell at home yesterday and his brother was feeding him while he was on the floor waiting for him to get enough energy to get up.  He denied any chest pain or palpitations earlier.  No cough or wheezing or dyspnea.  No neck pain or stiffness.  No fever or chills.  He depleted and dehydrated with cracked lips and dry mucous membranes.  He was very somnolent during admit and only responsive to painful stimuli.  Pt is more alert now w/ constant gumming/chewing on his tongue/mouth noted.  Per MD note, patient is 10 days past COVID virus infection, not hypoxic.  Patient seems to be confused at baseline and does not wish to eat or get out of bed.  Patient is severely malnourished with moderate fat depletion, weight loss.  Patient's brother wishes to take him home even though I explained him about his current condition.   CXR at admit: No active cardiopulmonary disease.    Assessment / Plan / Recommendation  Clinical Impression   Pt seen for BSE. Pt awake, mumbled  speech/jargon. Responses were inconsistent and not to direct questions re: self. Pt did not follow commands. Pt has a Baseline of Dementia. Noted constant gumming/chewing motions/movements d/t the Dementia. Eyes closed the majority of eval. Pt tended to want to curl up on his side in bed.  On RA, afebrile. WBC WNL.  Pt appears to present w/ oropharyngeal phase dysphagia in setting of declined Cognitive status; Baseline Dementia, which appeared advanced in presentation. ANY Cognitive decline can impact overall awareness/timing of swallow and safety during po tasks which increases risk for aspiration, choking as well as result in decreased oral intake overall.  Pt's risk for aspiration is present but can be reduced when following general aspiration precautions w/ 100% Supervision and feeding support while using a modified diet consistency of Pureed foods w/ thin liquids.    Pt consumed trials of ice chips, purees, and thin liquids w/ No overt clinical s/s of aspiration noted w/ the trials: no decline in vocal quality when he spoke, no cough, and no decline in respiratory status during/post trials. O2 sats remained in mid90s. Oral phase was c/b significant impact from the Cognitive decline resulting in lengthy oral phase time, gumming/chewing motions w/ bolus material held in mouth, delay in A-P transfer of boluses for swallowing. Pt eventually swallowed and oral clearing of the boluses given was noted. Pt required MOD tactile/verbal stim w/ utensil/straw at lips to engage oral prep and mouth opening  to accept boluses. MAX cues throughout feeding. No solid foods assessed d/t Cognitive decline and Edentulous status. OM Exam could not be completed but no overt unilateral oral weakness noted. Severe confusion w/ attempted oral care tasks noted.     D/t pt's Baseline, declined Cognitive status w/ advanced-appearing Dementia impacting the oral phase of swallowing (primarily) and pt's risk for aspiration, recommend  continuing the dysphagia level 1(PUREE) w/ thin liquids w/ 100% Supervision and feeding support to monitor all oral intake fed to pt. Aspiration precautions; reduce Distractions during meals and engage pt during po's at meals for self-feeding as able. Give Time b/t bites/sips and check for oral clearing b/t as needed. Pills Crushed in Puree for safer swallowing. MD/NSG updated.   ST services recommends follow w/ Palliative Care for GOC and education re: impact of Cognitive decline/Dementia on swallowing and overall oral intake. No further Acute ST services indicated. Suspect pt is at/near his Baseline from a dysphagia standpoint. Precautions posted in room; chart.  SLP Visit Diagnosis: Dysphagia, oral phase (R13.11);Dysphagia, oropharyngeal phase (R13.12) (in setting of advanced appearing Dementia)    Aspiration Risk  Mild aspiration risk;Risk for inadequate nutrition/hydration (reduced w/ aspiration precautions; feeding support)    Diet Recommendation   Thin;Dysphagia 1 (puree) = the dysphagia level 1(PUREE) w/ thin liquids w/ 100% Supervision and feeding support to monitor all oral intake fed to pt. Aspiration precautions; reduce Distractions during meals and engage pt during po's at meals for self-feeding as able. Give Time b/t bites/sips and check for oral clearing b/t as needed.  Medication Administration: Crushed with puree    Other  Recommendations Recommended Consults:  (Palliative Care; Dietician) Oral Care Recommendations: Oral care BID;Oral care before and after PO;Staff/trained caregiver to provide oral care    Recommendations for follow up therapy are one component of a multi-disciplinary discharge planning process, led by the attending physician.  Recommendations may be updated based on patient status, additional functional criteria and insurance authorization.  Follow up Recommendations No SLP follow up      Assistance Recommended at Discharge  FULL  Functional Status Assessment  Patient has had a recent decline in their functional status and/or demonstrates limited ability to make significant improvements in function in a reasonable and predictable amount of time  Frequency and Duration  (n/a)   (n/a)       Prognosis Prognosis for improved oropharyngeal function: Guarded Barriers to Reach Goals: Cognitive deficits;Language deficits;Time post onset;Severity of deficits;Behavior Barriers/Prognosis Comment: in setting of advanced appearing Dementia      Swallow Study   General Date of Onset: 03/05/23 HPI: Pt is a 78 y.o. Caucasian male with medical history significant for Dementia, hypertension and dyslipidemia, Malnourished, requring assist w/ ADLs, who presented to the emergency room with acute onset of altered mental status and fall.  Apparently fell at home yesterday and his brother was feeding him while he was on the floor waiting for him to get enough energy to get up.  He denied any chest pain or palpitations earlier.  No cough or wheezing or dyspnea.  No neck pain or stiffness.  No fever or chills.  He depleted and dehydrated with cracked lips and dry mucous membranes.  He was very somnolent during admit and only responsive to painful stimuli.  Pt is more alert now w/ constant gumming/chewing on his tongue/mouth noted.  Per MD note, patient is 10 days past COVID virus infection, not hypoxic.  Patient seems to be confused at baseline and does not wish  to eat or get out of bed.  Patient is severely malnourished with moderate fat depletion, weight loss.  Patient's brother wishes to take him home even though I explained him about his current condition.   CXR at admit: No active cardiopulmonary disease. Type of Study: Bedside Swallow Evaluation Previous Swallow Assessment: none Diet Prior to this Study: Dysphagia 1 (pureed);Thin liquids (Level 0) Temperature Spikes Noted: No (wbc 8.4) Respiratory Status: Room air History of Recent Intubation: No Behavior/Cognition:  Alert;Cooperative;Pleasant mood;Confused;Distractible;Requires cueing (needed tactile cues) Oral Cavity Assessment:  (could not assess) Oral Care Completed by SLP:  (attempted) Oral Cavity - Dentition: Edentulous (appeared) Vision:  (n/a) Self-Feeding Abilities: Total assist Patient Positioning: Upright in bed (more on his side) Baseline Vocal Quality: Low vocal intensity (muttered/mumbled most of the time) Volitional Cough: Cognitively unable to elicit Volitional Swallow: Unable to elicit    Oral/Motor/Sensory Function Overall Oral Motor/Sensory Function:  (no unilateral weakness noted)   Ice Chips Ice chips: Impaired Presentation:  (oral prep) Oral Phase Impairments: Poor awareness of bolus Pharyngeal Phase Impairments:  (no overt coughing)   Thin Liquid Thin Liquid: Impaired Presentation: Straw (fed; ~5-6 ozs total) Oral Phase Impairments: Poor awareness of bolus Oral Phase Functional Implications: Prolonged oral transit Pharyngeal  Phase Impairments: Suspected delayed Swallow (no overt coughing) Other Comments: tactile cues from straw at lips required    Nectar Thick Nectar Thick Liquid: Not tested   Honey Thick Honey Thick Liquid: Not tested   Puree Puree: Impaired Presentation: Spoon (fed; 12+ trials) Oral Phase Impairments: Poor awareness of bolus Oral Phase Functional Implications: Prolonged oral transit;Oral holding Pharyngeal Phase Impairments: Suspected delayed Swallow (no overt coughing)   Solid     Solid: Not tested        Jerilynn Som, MS, CCC-SLP Speech Language Pathologist Rehab Services; [facility] [place] [phone] (ascom) Zeidy Tayag 03/15/2023,2:55 PM

## 2023-03-15 NOTE — Plan of Care (Signed)
  Problem: Respiratory: Goal: Will maintain a patent airway Outcome: Progressing   Problem: Elimination: Goal: Will not experience complications related to urinary retention Outcome: Progressing   Problem: Safety: Goal: Ability to remain free from injury will improve Outcome: Progressing

## 2023-03-15 NOTE — Progress Notes (Signed)
PHARMACY CONSULT NOTE - FOLLOW UP  Pharmacy Consult for Electrolyte Monitoring and Replacement   Recent Labs: Potassium (mmol/L)  Date Value  03/15/2023 2.9 (L)   Magnesium (mg/dL)  Date Value  28/41/3244 2.1   Calcium (mg/dL)  Date Value  02/21/7251 8.4 (L)   Albumin (g/dL)  Date Value  66/44/0347 2.9 (L)  12/03/2022 4.0   Sodium (mmol/L)  Date Value  03/15/2023 158 (H)  12/03/2022 139     Assessment: 78 y.o. male w/ PMH of HTN, HLD, dementia presented w/ AMS and s/p mechanical fall. Pharmacy is asked to follow and replace electrolytes,  Goal of Therapy:  Electrolytes WNL  Plan:  ---10 mEq IV KCl x 4 per MD ---recheck electrolytes in am  Lowella Bandy ,PharmD Clinical Pharmacist 03/15/2023 4:12 PM

## 2023-03-16 DIAGNOSIS — U071 COVID-19: Secondary | ICD-10-CM | POA: Diagnosis not present

## 2023-03-16 DIAGNOSIS — E876 Hypokalemia: Secondary | ICD-10-CM | POA: Diagnosis not present

## 2023-03-16 DIAGNOSIS — H66002 Acute suppurative otitis media without spontaneous rupture of ear drum, left ear: Secondary | ICD-10-CM | POA: Diagnosis not present

## 2023-03-16 DIAGNOSIS — M6282 Rhabdomyolysis: Secondary | ICD-10-CM | POA: Diagnosis not present

## 2023-03-16 LAB — RENAL FUNCTION PANEL
Albumin: 3.1 g/dL — ABNORMAL LOW (ref 3.5–5.0)
Anion gap: 12 (ref 5–15)
BUN: 57 mg/dL — ABNORMAL HIGH (ref 8–23)
CO2: 27 mmol/L (ref 22–32)
Calcium: 8.5 mg/dL — ABNORMAL LOW (ref 8.9–10.3)
Chloride: 121 mmol/L — ABNORMAL HIGH (ref 98–111)
Creatinine, Ser: 1.77 mg/dL — ABNORMAL HIGH (ref 0.61–1.24)
GFR, Estimated: 39 mL/min — ABNORMAL LOW (ref >60)
Glucose, Bld: 106 mg/dL — ABNORMAL HIGH (ref 70–99)
Phosphorus: 3.2 mg/dL (ref 2.5–4.6)
Potassium: 3.8 mmol/L (ref 3.5–5.1)
Sodium: 160 mmol/L — ABNORMAL HIGH (ref 135–145)

## 2023-03-16 LAB — GLUCOSE, CAPILLARY
Glucose-Capillary: 102 mg/dL — ABNORMAL HIGH (ref 70–99)
Glucose-Capillary: 104 mg/dL — ABNORMAL HIGH (ref 70–99)
Glucose-Capillary: 116 mg/dL — ABNORMAL HIGH (ref 70–99)
Glucose-Capillary: 99 mg/dL (ref 70–99)

## 2023-03-16 LAB — MAGNESIUM: Magnesium: 2.8 mg/dL — ABNORMAL HIGH (ref 1.7–2.4)

## 2023-03-16 MED ORDER — DEXTROSE 5 % IV SOLN: INTRAVENOUS | Status: DC

## 2023-03-16 NOTE — Progress Notes (Signed)
Progress Note   Patient: Drew Russell HQI:[id number] DOB: 1946-01-07 DOA: 03/04/2023     11 DOS: the patient was seen and examined on 03/16/2023   Brief hospital course: HPI: OZZY BOHLKEN is a 78 y.o. Caucasian male with medical history significant for hypertension and dyslipidemia as well as dementia, presented to the emergency room with acute onset of altered mental status and fall.  Apparently fell at home yesterday and his brother was feeding him while he was on the floor waiting for him to get enough energy to get up.  He denied any chest pain or palpitations earlier.  No cough or wheezing or dyspnea.  No neck pain or stiffness.  No fever or chills.  He depleted and dehydrated with cracked lips and dry mucous membranes.  he was very somnolent during my interview and only responsive to painful stimuli and therefore no further history could be obtained.   During hospital stay, his CK level, potassium improved, patient is 10 days past COVID virus infection, not hypoxic.  Patient seems to be confused at baseline and does not wish to eat or get out of bed.  Patient is severely malnourished with moderate fat depletion, weight loss.  Patient's brother wishes to take him home even though I explained him about his current condition. I consulted Palliative team for goals of care discussion. SLP advised pureed diet.  Assessment and Plan: * Non-traumatic rhabdomyolysis Acute metabolic encephalopathy Poor oral intake, anorexia Severe protein calorie malnutrition Patient is very weak, eating poor. CK trended down.  Potassium improved. Mental status poor, not able to answer me. Kidney injury in the setting of poor oral intake. Continue gentle IV fluids with D5 w.  Palliative care team consult for goals of care.  Acute exudative otitis media of left ear Continue with ear drops and po augmentin if he will allow it.  COVID-19 virus infection Dc isolation, as he is more than 10 days past  COVID Continue supportive care. Fall, aspiration precautions.  Hypernatremia - In the setting of dehydration, poor oral intake. Continue IV hydration. Trend Sodium.  Hypokalemia K 3.8 after supplementation. Supplements and recheck as needed Pharmacy on board for electrolyte repletion.  Dyslipidemia On statin therapy.  Hypotension due to hypovolemia BP stable. Now off midodrine.  Protein-calorie malnutrition, severe Anorexia Continue Remeron Encourage oral diet, supplements. Assist with feeding.  Pancytopenia (HCC) Due to COVID. Counts improved.  Anemia in chronic kidney disease (CKD) HgB stable around 11. No active bleeding.  Acute on chronic kidney disease stage III. Worsening kidney function due to poor oral intake. Gentle IV fluids with D5W Avoid nephrotoxic drugs.     Out of bed to chair. Incentive spirometry when awake. Nursing supportive care. Fall, aspiration precautions. DVT prophylaxis   Code Status: Full Code  Subjective: Patient is seen and examined today morning. He is sleepy, not able to answer mw. Has mittens for safety. Eating poor.   Physical Exam: Vitals:   03/15/23 0815 03/15/23 1522 03/15/23 2123 03/16/23 0805  BP: (!) 124/52 (!) 136/43 (!) 133/93 136/83  Pulse: 83 81 84 87  Resp: 16 16 16 18   Temp: (!) 97.5 F (36.4 C) 97.6 F (36.4 C) 98 F (36.7 C) 98.1 F (36.7 C)  TempSrc: Oral   Axillary  SpO2: 100% 98% 99% 98%  Weight:        General - Elderly ill Caucasian male, sleepy and lethargic. HEENT - PERRLA, EOMI, atraumatic head, non tender sinuses. Lung - Clear, basal rales, rhonchi,  no wheezes. Heart - S1, S2 heard, no murmurs, rubs, trace pedal edema. Abdomen - Soft, non tender, bowel sounds good Neuro - Sleeping, does not follow commands, unable to do full neuro exam. Skin - Warm and dry.  Data Reviewed:      Latest Ref Rng & Units 03/14/2023    8:38 AM 03/09/2023    5:57 AM 03/05/2023    3:20 AM  CBC  WBC 4.0 -  10.5 K/uL 8.4  5.5  3.1   Hemoglobin 13.0 - 17.0 g/dL 78.4  69.6  29.5   Hematocrit 39.0 - 52.0 % 35.7  32.1  29.9   Platelets 150 - 400 K/uL 258  145  109       Latest Ref Rng & Units 03/16/2023    4:46 AM 03/15/2023   10:11 AM 03/14/2023    8:38 AM  BMP  Glucose 70 - 99 mg/dL 284  132  440   BUN 8 - 23 mg/dL 57  57  54   Creatinine 0.61 - 1.24 mg/dL 1.02  7.25  3.66   Sodium 135 - 145 mmol/L 160  158  157   Potassium 3.5 - 5.1 mmol/L 3.8  2.9  3.7   Chloride 98 - 111 mmol/L 121  118  118   CO2 22 - 32 mmol/L 27  28  27    Calcium 8.9 - 10.3 mg/dL 8.5  8.4  8.5    No results found.  Family Communication: Discussed with brother over phone yesterday who want to take him home even after explaining that he is weak and not eating well. Pending palliative care consult.  Disposition: Status is: Inpatient Remains inpatient appropriate because: weak, poor appetite, need rehab, palliative care consult.  Planned Discharge Destination: Rehab     Time spent: 39 minutes  Author: Marcelino Duster, MD 03/16/2023 2:15 PM Secure chat 7am to 7pm For on call review [email or web address].

## 2023-03-16 NOTE — Progress Notes (Signed)
PHARMACY CONSULT NOTE - FOLLOW UP  Pharmacy Consult for Electrolyte Monitoring and Replacement   Recent Labs: Potassium (mmol/L)  Date Value  03/16/2023 3.8   Magnesium (mg/dL)  Date Value  16/11/9602 2.8 (H)   Calcium (mg/dL)  Date Value  54/10/8117 8.5 (L)   Albumin (g/dL)  Date Value  14/78/2956 3.1 (L)  12/03/2022 4.0   Phosphorus (mg/dL)  Date Value  21/30/8657 3.2   Sodium (mmol/L)  Date Value  03/16/2023 160 (H)  12/03/2022 139     Assessment: 78 y.o. male w/ PMH of HTN, HLD, dementia presented w/ AMS and s/p mechanical fall. Pharmacy is asked to follow and replace electrolytes,  Goal of Therapy:  Electrolytes WNL  Plan:  --Hypernatremia to be managed by primary team --No replacement indicated for today --recheck electrolytes in am  Bettey Costa ,PharmD Clinical Pharmacist 03/16/2023 7:49 AM

## 2023-03-17 DIAGNOSIS — Z515 Encounter for palliative care: Secondary | ICD-10-CM

## 2023-03-17 DIAGNOSIS — H66002 Acute suppurative otitis media without spontaneous rupture of ear drum, left ear: Secondary | ICD-10-CM | POA: Diagnosis not present

## 2023-03-17 DIAGNOSIS — G9341 Metabolic encephalopathy: Secondary | ICD-10-CM | POA: Diagnosis not present

## 2023-03-17 DIAGNOSIS — U071 COVID-19: Secondary | ICD-10-CM | POA: Diagnosis not present

## 2023-03-17 DIAGNOSIS — M6282 Rhabdomyolysis: Secondary | ICD-10-CM | POA: Diagnosis not present

## 2023-03-17 DIAGNOSIS — E876 Hypokalemia: Secondary | ICD-10-CM | POA: Diagnosis not present

## 2023-03-17 LAB — BASIC METABOLIC PANEL
Anion gap: 7 (ref 5–15)
BUN: 52 mg/dL — ABNORMAL HIGH (ref 8–23)
CO2: 27 mmol/L (ref 22–32)
Calcium: 8 mg/dL — ABNORMAL LOW (ref 8.9–10.3)
Chloride: 121 mmol/L — ABNORMAL HIGH (ref 98–111)
Creatinine, Ser: 1.71 mg/dL — ABNORMAL HIGH (ref 0.61–1.24)
GFR, Estimated: 40 mL/min — ABNORMAL LOW (ref >60)
Glucose, Bld: 129 mg/dL — ABNORMAL HIGH (ref 70–99)
Potassium: 3.1 mmol/L — ABNORMAL LOW (ref 3.5–5.1)
Sodium: 155 mmol/L — ABNORMAL HIGH (ref 135–145)

## 2023-03-17 LAB — MAGNESIUM: Magnesium: 2.8 mg/dL — ABNORMAL HIGH (ref 1.7–2.4)

## 2023-03-17 LAB — PHOSPHORUS: Phosphorus: 2.7 mg/dL (ref 2.5–4.6)

## 2023-03-17 LAB — GLUCOSE, CAPILLARY
Glucose-Capillary: 119 mg/dL — ABNORMAL HIGH (ref 70–99)
Glucose-Capillary: 104 mg/dL — ABNORMAL HIGH (ref 70–99)
Glucose-Capillary: 104 mg/dL — ABNORMAL HIGH (ref 70–99)

## 2023-03-17 MED ORDER — POTASSIUM CHLORIDE 20 MEQ PO PACK
40.0000 meq | PACK | Freq: Once | ORAL | Status: AC
  Administered 2023-03-17: 40 meq via ORAL
  Filled 2023-03-17: qty 2

## 2023-03-17 MED ORDER — KCL-LACTATED RINGERS-D5W 20 MEQ/L IV SOLN
INTRAVENOUS | Status: AC
  Filled 2023-03-17: qty 1000

## 2023-03-17 NOTE — Progress Notes (Signed)
PHARMACY CONSULT NOTE - FOLLOW UP  Pharmacy Consult for Electrolyte Monitoring and Replacement   Recent Labs: Potassium (mmol/L)  Date Value  03/17/2023 3.1 (L)   Magnesium (mg/dL)  Date Value  16/11/9602 2.8 (H)   Calcium (mg/dL)  Date Value  54/10/8117 8.0 (L)   Albumin (g/dL)  Date Value  14/78/2956 3.1 (L)  12/03/2022 4.0   Phosphorus (mg/dL)  Date Value  21/30/8657 2.7   Sodium (mmol/L)  Date Value  03/17/2023 155 (H)  12/03/2022 139     Assessment: 78 y.o. male w/ PMH of HTN, HLD, dementia presented w/ AMS and s/p mechanical fall. Pharmacy is asked to follow and replace electrolytes,  Goal of Therapy:  Electrolytes WNL  Plan:  --Hypernatremia to be managed by primary team --Started on D5LR+53mEqK@40ml /hr by attending --Will order Kcl PO x 1 --recheck electrolytes in am  Bettey Costa ,PharmD Clinical Pharmacist 03/17/2023 8:57 AM

## 2023-03-17 NOTE — Plan of Care (Signed)
  Problem: Safety: Goal: Ability to remain free from injury will improve Outcome: Progressing   Problem: Pain Managment: Goal: General experience of comfort will improve and/or be controlled Outcome: Progressing   Problem: Skin Integrity: Goal: Risk for impaired skin integrity will decrease Outcome: Progressing

## 2023-03-17 NOTE — Plan of Care (Signed)
  Problem: Clinical Measurements: Goal: Will remain free from infection Outcome: Progressing Goal: Diagnostic test results will improve Outcome: Progressing   Problem: Activity: Goal: Risk for activity intolerance will decrease Outcome: Progressing   Problem: Nutrition: Goal: Adequate nutrition will be maintained Outcome: Progressing   Problem: Coping: Goal: Level of anxiety will decrease Outcome: Progressing

## 2023-03-17 NOTE — Consult Note (Cosign Needed Addendum)
Consultation Note Date: 03/17/2023   Patient Name: Drew Russell  DOB: 09-Jan-1946  MRN: [id number]  Age / Sex: 78 y.o., male  PCP: Larae Grooms, NP Referring Physician: Marcelino Duster, MD  Reason for Consultation: Establishing goals of care   HPI/Brief Hospital Course: 78 y.o. male  with past medical history of dementia, HTN and HLD admitted from home on 03/04/2023 s/p fall.  Found to have elevated CK and COVID positive CT head (-) and CXR (-)  Admitted and being treated for rhabdomyolysis, COVID, encephalopathy and hypotension CK levels trended down  Course has been complicated by ongoing encephalopathy without significant improvement, minimal PO intake contributing to hypernatremia  Palliative medicine was consulted for assisting with goals of care conversations.  Subjective:  Extensive chart review has been completed prior to meeting patient including labs, vital signs, imaging, progress notes, orders, and available advanced directive documents from current and previous encounters.  Visited with Mr. Tabet at his bedside. He is resting in bed with eyes closed, acknowledges my presence in room with calling of his name. He attempts to communicate and responds mostly with "yes or no" responses, mostly hums. He is unable to follow commands. No family at bedside during time of visit.  Attempted calls to both brother and sister listed in emergency contacts, unsuccessfully.  Received return call from brother-Richard.  Introduced myself as a Publishing rights manager as a member of the palliative care team. Explained palliative medicine is specialized medical care for people living with serious illness. It focuses on providing relief from the symptoms and stress of a serious illness. The goal is to improve quality of life for both the patient and the family.   Telephone conversations with Richard complicated by him being [facility] and difficulty with  understanding his brothers current condition. Provided medical updates. Attempted to explain to Richard current mental status and assessment from earlier. We also discussed poor PO intake.  Richard shares he and Shreyan live together. Prior to fall and hospitalization, Gerlene Burdock shares Shyler was able to function independently and did not require assistance with ADL's.  Attempted to discuss patient's current illness and what it means in the larger context of patient's on-going co-morbidities. Natural disease trajectory and expectations at EOL were discussed.   The difference between aggressive medical intervention and comfort care was discussed.  Richard requests I call him back and place him on speaker phone so he could attempt communication with Dimas Aguas. During their conversation, Gerlene Burdock repeatedly told Elias to "eat, drink, take his pills and walk." Richard shares Westen's PCP told them those were the most important things for Zuri to focus on to "get better."  Encouraged Richard to visit in person at bedside. Discussed importance of Richard assessing Chigozie and discussing goals of care and his ability to return home. Richard shares he is recovering from a recent procedure for colon cancer. Richard has agreed to attempt to visit bedside tomorrow between 1-3pm. Discussion shared with primary team and will have PMT colleague follow-up at that time.  All questions/concerns addressed. PMT will continue to follow and support patient as needed.  Objective: Primary Diagnoses: Present on Admission:  Non-traumatic rhabdomyolysis  CKD stage 3a, GFR 45-59 ml/min (HCC) - baseline scR 1.4-1.6  Anemia in chronic kidney disease (CKD)  Acute renal failure superimposed on stage 3a chronic kidney disease (HCC)   Physical Exam Constitutional:      General: He is not in acute distress.    Appearance: He is ill-appearing.  Pulmonary:  Effort: Pulmonary effort is normal. No respiratory distress.   Skin:    General: Skin is warm and dry.     Findings: Bruising present.  Neurological:     Mental Status: He is disoriented.     Motor: Weakness present.     Vital Signs: BP 108/63 (BP Location: Right Arm)   Pulse 71   Temp (!) 97 F (36.1 C)   Resp 16   Wt 59 kg   SpO2 100%   BMI 21.30 kg/m  Pain Scale: 0-10 POSS *See Group Information*: S-Acceptable,Sleep, easy to arouse Pain Score: 0-No pain  IO: Intake/output summary:  Intake/Output Summary (Last 24 hours) at 03/17/2023 1540 Last data filed at 03/17/2023 1420 Gross per 24 hour  Intake 1785.42 ml  Output --  Net 1785.42 ml    LBM: Last BM Date : 03/07/23 Baseline Weight: Weight: 59 kg Most recent weight: Weight: 59 kg      Assessment and Plan  SUMMARY OF RECOMMENDATIONS   In person visit with brother tomorrow afternoon at bedside PMT to continue to follow for ongoing needs and support  Palliative Prophylaxis:   Bowel Regimen, Delirium Protocol and Frequent Pain Assessment  Discussed With: Primary team and nursing staff   Thank you for this consult and allowing Palliative Medicine to participate in the care of [name]. Palliative medicine will continue to follow and assist as needed.   Time Total: 75 minutes  Time spent includes: Detailed review of medical records (labs, imaging, vital signs), medically appropriate exam (mental status, respiratory, cardiac, skin), discussed with treatment team, counseling and educating patient, family and staff, documenting clinical information, medication management and coordination of care.   Signed by: Leeanne Deed, DNP, AGNP-C Palliative Medicine    Please contact Palliative Medicine Team phone at [phone] for questions and concerns.  For individual provider: See Loretha Stapler

## 2023-03-17 NOTE — Progress Notes (Signed)
Progress Note   Patient: Drew Russell NWG:[id number] DOB: 08/08/1945 DOA: 03/04/2023     12 DOS: the patient was seen and examined on 03/17/2023   Brief hospital course: HPI: Drew Russell is a 78 y.o. Caucasian male with medical history significant for hypertension and dyslipidemia as well as dementia, presented to the emergency room with acute onset of altered mental status and fall.  Apparently fell at home yesterday and his brother was feeding him while he was on the floor waiting for him to get enough energy to get up.  He denied any chest pain or palpitations earlier.  No cough or wheezing or dyspnea.  No neck pain or stiffness.  No fever or chills.  He depleted and dehydrated with cracked lips and dry mucous membranes.  he was very somnolent during my interview and only responsive to painful stimuli and therefore no further history could be obtained.   During hospital stay, his CK level, potassium improved, patient is 10 days past COVID virus infection, not hypoxic.  Patient seems to be confused at baseline and does not wish to eat or get out of bed.  Patient is severely malnourished with moderate fat depletion, weight loss.  Patient's brother wishes to take him home even though I explained him about his current condition. I consulted Palliative team for goals of care discussion. His over all health condition poor.  Assessment and Plan: * Non-traumatic rhabdomyolysis Acute metabolic encephalopathy Poor oral intake, anorexia Severe protein calorie malnutrition Patient is very weak, eating poor. CK trended down.  Potassium low. Mental status poor, not able to answer me. Kidney injury in the setting of poor oral intake. Continue gentle IV fluids with K supplement.  Palliative care team consult for goals of care. Family meeting tomorrow 1-3 pm.  COVID-19 virus infection Dc isolation, as he is more than 10 days past COVID Continue supportive care. Fall, aspiration precautions.  Acute  on chronic kidney disease stage III. Worsening kidney function due to poor oral intake. Gentle IV fluids with D5LR Avoid nephrotoxic drugs.  Hypernatremia - In the setting of dehydration, poor oral intake. Continue IV hydration. Trend Sodium.  Hypokalemia K 3.1 IV Kcl with fluids ordered. Pharmacy on board for electrolyte repletion.  Dyslipidemia On statin therapy.  Hypotension due to hypovolemia BP stable off midodrine.  Protein-calorie malnutrition, severe Anorexia Continue Remeron Encourage oral diet, supplements. Assist with feeding.  Pancytopenia (HCC) Due to COVID. Counts improved.  Anemia in chronic kidney disease (CKD) HgB stable around 11. No active bleeding.     Out of bed to chair. Incentive spirometry when awake. Nursing supportive care. Fall, aspiration precautions. DVT prophylaxis   Code Status: Full Code  Subjective: Patient is seen and examined today morning. He is sleepy, not able to answer mw. Has mittens for safety. Eating poor.   Physical Exam: Vitals:   03/16/23 1538 03/16/23 2255 03/17/23 0907 03/17/23 1549  BP: 120/82 111/76 108/63 (!) 103/59  Pulse: 86 85 71 72  Resp: 18 18 16 16   Temp: (!) 97.5 F (36.4 C) 98.8 F (37.1 C) (!) 97 F (36.1 C)   TempSrc:      SpO2: 100% 98% 100% (!) 87%  Weight:        General - Elderly ill Caucasian male, sleepy and lethargic. HEENT - PERRLA, EOMI, atraumatic head, non tender sinuses. Lung - Clear, basal rales, rhonchi, no wheezes. Heart - S1, S2 heard, no murmurs, rubs, trace pedal edema. Abdomen - Soft, non tender, bowel  sounds good Neuro - Sleeping, does not follow commands, unable to do full neuro exam. Skin - Warm and dry.  Data Reviewed:      Latest Ref Rng & Units 03/14/2023    8:38 AM 03/09/2023    5:57 AM 03/05/2023    3:20 AM  CBC  WBC 4.0 - 10.5 K/uL 8.4  5.5  3.1   Hemoglobin 13.0 - 17.0 g/dL 16.1  09.6  04.5   Hematocrit 39.0 - 52.0 % 35.7  32.1  29.9   Platelets 150 -  400 K/uL 258  145  109       Latest Ref Rng & Units 03/17/2023    4:54 AM 03/16/2023    4:46 AM 03/15/2023   10:11 AM  BMP  Glucose 70 - 99 mg/dL 409  811  914   BUN 8 - 23 mg/dL 52  57  57   Creatinine 0.61 - 1.24 mg/dL 7.82  9.56  2.13   Sodium 135 - 145 mmol/L 155  160  158   Potassium 3.5 - 5.1 mmol/L 3.1  3.8  2.9   Chloride 98 - 111 mmol/L 121  121  118   CO2 22 - 32 mmol/L 27  27  28    Calcium 8.9 - 10.3 mg/dL 8.0  8.5  8.4    No results found.  Family Communication: Discussed with brother over phone yesterday who want to take him home even after explaining that he is weak and not eating well. Pending palliative care consult.  Disposition: Status is: Inpatient Remains inpatient appropriate because: weak, poor appetite, family meet for palliative goals of care.  Planned Discharge Destination: Skilled nursing facility pending family meeting.     Time spent: 39 minutes  Author: Marcelino Duster, MD 03/17/2023 5:14 PM Secure chat 7am to 7pm For on call review [email or web address].

## 2023-03-18 DIAGNOSIS — E876 Hypokalemia: Secondary | ICD-10-CM | POA: Diagnosis not present

## 2023-03-18 DIAGNOSIS — H66002 Acute suppurative otitis media without spontaneous rupture of ear drum, left ear: Secondary | ICD-10-CM | POA: Diagnosis not present

## 2023-03-18 DIAGNOSIS — M6282 Rhabdomyolysis: Secondary | ICD-10-CM | POA: Diagnosis not present

## 2023-03-18 DIAGNOSIS — W19XXXA Unspecified fall, initial encounter: Secondary | ICD-10-CM

## 2023-03-18 DIAGNOSIS — U071 COVID-19: Secondary | ICD-10-CM | POA: Diagnosis not present

## 2023-03-18 DIAGNOSIS — Z515 Encounter for palliative care: Secondary | ICD-10-CM | POA: Diagnosis not present

## 2023-03-18 LAB — BASIC METABOLIC PANEL
Anion gap: 7 (ref 5–15)
BUN: 50 mg/dL — ABNORMAL HIGH (ref 8–23)
CO2: 29 mmol/L (ref 22–32)
Calcium: 8.4 mg/dL — ABNORMAL LOW (ref 8.9–10.3)
Chloride: 125 mmol/L — ABNORMAL HIGH (ref 98–111)
Creatinine, Ser: 1.77 mg/dL — ABNORMAL HIGH (ref 0.61–1.24)
GFR, Estimated: 39 mL/min — ABNORMAL LOW (ref >60)
Glucose, Bld: 114 mg/dL — ABNORMAL HIGH (ref 70–99)
Potassium: 3.9 mmol/L (ref 3.5–5.1)
Sodium: 161 mmol/L (ref 135–145)

## 2023-03-18 LAB — CBC
HCT: 31.7 % — ABNORMAL LOW (ref 39.0–52.0)
Hemoglobin: 10.4 g/dL — ABNORMAL LOW (ref 13.0–17.0)
MCH: 32.5 pg (ref 26.0–34.0)
MCHC: 32.8 g/dL (ref 30.0–36.0)
MCV: 99.1 fL (ref 80.0–100.0)
Platelets: 232 10*3/uL (ref 150–400)
RBC: 3.2 MIL/uL — ABNORMAL LOW (ref 4.22–5.81)
RDW: 14.3 % (ref 11.5–15.5)
WBC: 6.1 10*3/uL (ref 4.0–10.5)
nRBC: 0 % (ref 0.0–0.2)

## 2023-03-18 LAB — MAGNESIUM: Magnesium: 3.3 mg/dL — ABNORMAL HIGH (ref 1.7–2.4)

## 2023-03-18 LAB — GLUCOSE, CAPILLARY
Glucose-Capillary: 100 mg/dL — ABNORMAL HIGH (ref 70–99)
Glucose-Capillary: 115 mg/dL — ABNORMAL HIGH (ref 70–99)
Glucose-Capillary: 127 mg/dL — ABNORMAL HIGH (ref 70–99)
Glucose-Capillary: 90 mg/dL (ref 70–99)

## 2023-03-18 LAB — PHOSPHORUS: Phosphorus: 2.3 mg/dL — ABNORMAL LOW (ref 2.5–4.6)

## 2023-03-18 MED ORDER — DEXTROSE 5 % IV SOLN: INTRAVENOUS | Status: AC

## 2023-03-18 MED ORDER — POTASSIUM & SODIUM PHOSPHATES 280-160-250 MG PO PACK
1.0000 | PACK | Freq: Three times a day (TID) | ORAL | Status: AC
  Administered 2023-03-18 (×3): 1 via ORAL
  Filled 2023-03-18 (×3): qty 1

## 2023-03-18 NOTE — Progress Notes (Signed)
Occupational Therapy Treatment Patient Details Name: Drew Russell MRN: [id number] DOB: 07-Jun-1945 Today's Date: 03/18/2023   History of present illness Pt is a 78 year old male presented to the ED with AMS, admitted with non-traumatic rhabdomyolysis and Covid-19 infection  PMH significant for hypertension and dyslipidemia as well as dementia,   OT comments  Drew Russell was seen for OT treatment on this date. Upon arrival to room pt in bed with meal spilled onto sheets. Pt requires TOTAL A sup<>sit with fair sitting balance. MOD A bathing in sitting, pt washes face and upper chest with cues. MAX A don gown. MOD A for sit<>stand. Agitation improved from last session but continues to be limited by cognition requires constant cueing as redirection. OT trial completed with no evidence of learning noted. No skilled acute OT needs identified, will sign off. Discharge recommendation remains appropriate.       If plan is discharge home, recommend the following:  A lot of help with walking and/or transfers;A lot of help with bathing/dressing/bathroom;Assistance with cooking/housework;Direct supervision/assist for medications management;Assist for transportation;Supervision due to cognitive status;Help with stairs or ramp for entrance;Direct supervision/assist for financial management   Equipment Recommendations  Hospital bed    Recommendations for Other Services      Precautions / Restrictions Precautions Precautions: Fall Restrictions Weight Bearing Restrictions Per Provider Order: No       Mobility Bed Mobility Overal bed mobility: Needs Assistance Bed Mobility: Supine to Sit, Sit to Supine Rolling: Total assist   Supine to sit: Total assist          Transfers Overall transfer level: Needs assistance Equipment used: 1 person hand held assist Transfers: Sit to/from Stand Sit to Stand: Mod assist                 Balance Overall balance assessment: Needs  assistance Sitting-balance support: Feet supported Sitting balance-Leahy Scale: Fair     Standing balance support: Bilateral upper extremity supported Standing balance-Leahy Scale: Poor                             ADL either performed or assessed with clinical judgement   ADL Overall ADL's : Needs assistance/impaired                                       General ADL Comments: MOD A bathing in sitting, pt washes face and upper chest with cues. MAX A don gown. MOD A for simulated BSC t/f      Cognition Arousal: Alert Behavior During Therapy: Restless Overall Cognitive Status: No family/caregiver present to determine baseline cognitive functioning                                                     Pertinent Vitals/ Pain       Pain Assessment Pain Assessment: PAINAD Breathing: normal Negative Vocalization: occasional moan/groan, low speech, negative/disapproving quality Facial Expression: sad, frightened, frown Body Language: tense, distressed pacing, fidgeting Consolability: distracted or reassured by voice/touch PAINAD Score: 4 Pain Intervention(s): Repositioned, Limited activity within patient's tolerance   Frequency  Min 1X/week        Progress Toward Goals  OT Goals(current goals can now be found  in the care plan section)  Progress towards OT goals: Not progressing toward goals - comment  Acute Rehab OT Goals OT Goal Formulation: With patient Time For Goal Achievement: 03/20/23 Potential to Achieve Goals: Good ADL Goals Pt Will Perform Grooming: with supervision;sitting Pt Will Perform Lower Body Dressing: with supervision;sit to/from stand;sitting/lateral leans Pt Will Transfer to Toilet: with supervision;ambulating Pt Will Perform Toileting - Clothing Manipulation and hygiene: with supervision;sitting/lateral leans;sit to/from stand  Plan      Co-evaluation                 AM-PAC OT "6 Clicks"  Daily Activity     Outcome Measure   Help from another person eating meals?: A Little Help from another person taking care of personal grooming?: A Little Help from another person toileting, which includes using toliet, bedpan, or urinal?: A Lot Help from another person bathing (including washing, rinsing, drying)?: A Lot Help from another person to put on and taking off regular upper body clothing?: A Lot Help from another person to put on and taking off regular lower body clothing?: A Lot 6 Click Score: 14    End of Session    OT Visit Diagnosis: Other abnormalities of gait and mobility (R26.89);Muscle weakness (generalized) (M62.81)   Activity Tolerance Patient tolerated treatment well   Patient Left in bed;with call bell/phone within reach;with bed alarm set;with nursing/sitter in room   Nurse Communication Mobility status        Time: 2956-2130 OT Time Calculation (min): 23 min  Charges: OT General Charges $OT Visit: 1 Visit OT Treatments $Self Care/Home Management : 23-37 mins  Kathie Dike, M.S. OTR/L  03/18/23, 12:41 PM  ascom [phone]

## 2023-03-18 NOTE — Progress Notes (Signed)
                                                     Palliative Care Progress Note, Assessment & Plan   Patient Name: Drew Russell       Date: 03/18/2023 DOB: April 23, 1945  Age: 78 y.o. MRN#: [id number] Attending Physician: Marcelino Duster, MD Primary Care Physician: Larae Grooms, NP Admit Date: 03/04/2023  Subjective: Patient is sleeping in bed.  He is not easily awakened but eventually wakes up to my presence.  He returns to sleep without any making any vocalizations.  No signs of distress noted.  No family or friends present during my visit.  HPI: 78 y.o. male  with past medical history of dementia, HTN and HLD admitted from home on 03/04/2023 s/p fall.   Found to have elevated CK and COVID positive CT head (-) and CXR (-)   Admitted and being treated for rhabdomyolysis, COVID, encephalopathy and hypotension CK levels trended down   Course has been complicated by ongoing encephalopathy without significant improvement, minimal PO intake contributing to hypernatremia   Palliative medicine was consulted for assisting with goals of care conversations.  Summary of counseling/coordination of care: Extensive chart review completed prior to meeting patient including labs, vital signs, imaging, progress notes, orders, and available advanced directive documents from current and previous encounters.   After reviewing the patient's chart and assessing the patient at bedside, I spoke with dayshirt RN.  I counseled that patient's brother plans to be bedside between 1 and 3 PM today.  I requested that she let me know when patient's brother is at bedside.  In the afternoon, I received a secure chat from RN that patient's brother was at bedside.  Unfortunately, I was caring for a another patient in the ICU.  I secure chatted with attending who was able to  come bedside to continue discussions in regards to plan of care with patient and brother.  As per attending, patient's brother would like comfort measures and then to be discharged home.  TOC is following closely for discharge planning.  Given high PMT patient census and staffing challenges, I was unable to meet with patient today.  I plan to follow-up with him tomorrow.  Physical Exam Vitals reviewed.  Constitutional:      General: He is not in acute distress.    Appearance: He is not ill-appearing.  HENT:     Head: Normocephalic.     Mouth/Throat:     Mouth: Mucous membranes are moist.  Pulmonary:     Effort: Pulmonary effort is normal.  Abdominal:     Palpations: Abdomen is soft.  Skin:    General: Skin is warm and dry.  Psychiatric:        Mood and Affect: Mood normal.        Behavior: Behavior normal.        Judgment: Judgment normal.             Total Time 25 minutes   Time spent includes: Detailed review of medical records (labs, imaging, vital signs), medically appropriate exam (mental status, respiratory, cardiac, skin), discussed with treatment team, counseling and educating patient, family and staff, documenting clinical information, medication management and coordination of care.  Samara Deist L. Bonita Quin, DNP, FNP-BC Palliative Medicine Team

## 2023-03-18 NOTE — Progress Notes (Signed)
Nutrition Brief Note  Chart reviewed. Pt now transitioning to comfort care.  No further nutrition interventions planned at this time.  Please re-consult as needed.   Levada Schilling, RD, LDN, CDCES Registered Dietitian III Certified Diabetes Care and Education Specialist If unable to reach this RD, please use "RD Inpatient" group chat on secure chat between hours of 8am-4 pm daily

## 2023-03-18 NOTE — Progress Notes (Signed)
PHARMACY CONSULT NOTE  Pharmacy Consult for Electrolyte Monitoring and Replacement   Recent Labs: Potassium (mmol/L)  Date Value  03/18/2023 3.9   Magnesium (mg/dL)  Date Value  19/14/7829 3.3 (H)   Calcium (mg/dL)  Date Value  56/21/3086 8.4 (L)   Albumin (g/dL)  Date Value  57/84/6962 3.1 (L)  12/03/2022 4.0   Phosphorus (mg/dL)  Date Value  95/28/4132 2.3 (L)   Sodium (mmol/L)  Date Value  03/18/2023 161 (HH)  12/03/2022 139   Assessment: 78 y.o. male w/ PMH of HTN, HLD, dementia presented w/ AMS and s/p mechanical fall. Pharmacy is asked to follow and replace electrolytes,  Goal of Therapy:  Electrolytes WNL  Plan:  --Hypernatremia to be managed by primary team. Recommend re-starting D5w at higher rate given increase in sodium --Phos 2.3, Phos-Nak 1 packet PO x 3 doses today --Re-check electrolytes in AM  Tressie Ellis 03/18/2023 7:12 AM

## 2023-03-18 NOTE — Progress Notes (Signed)
Progress Note   Patient: Drew Russell:[id number] DOB: 1946-02-04 DOA: 03/04/2023     13 DOS: the patient was seen and examined on 03/18/2023   Brief hospital course: HPI: Drew Russell is a 78 y.o. Caucasian male with medical history significant for hypertension and dyslipidemia as well as dementia, presented to the emergency room with acute onset of altered mental status and fall.  Apparently fell at home yesterday and his brother was feeding him while he was on the floor waiting for him to get enough energy to get up.  He denied any chest pain or palpitations earlier.  No cough or wheezing or dyspnea.  No neck pain or stiffness.  No fever or chills.  He depleted and dehydrated with cracked lips and dry mucous membranes.  he was very somnolent during my interview and only responsive to painful stimuli and therefore no further history could be obtained.   During hospital stay, his CK level, potassium improved, patient is 10 days past COVID virus infection, not hypoxic.  Patient seems to be confused at baseline and does not wish to eat or get out of bed.  Patient is severely malnourished with moderate fat depletion, weight loss.  Patient's brother understands poor prognosis agreed with comfort care only, wishes to take him home with [facility].  Assessment and Plan: * Non-traumatic rhabdomyolysis Acute metabolic encephalopathy Poor oral intake, anorexia Severe protein calorie malnutrition Patient is very weak, eating poor. Mental status poor, confused, unable to have a meaningful conversation. Brother at bedside wishes for palliative care upon discharge. Continue gentle IV fluids with K supplement.   COVID-19 virus infection Dc isolation, as he is more than 10 days past COVID Continue supportive care. Fall, aspiration precautions.  Acute on chronic kidney disease stage III. Worsening kidney function due to poor oral intake. Gentle IV fluids with D5W Avoid nephrotoxic  drugs.  Hypernatremia - Worsening in the setting of dehydration, poor oral intake. Continue gentle IV hydration. Trend Sodium.  Hypokalemia K 3.1 IV Kcl with fluids ordered. Pharmacy on board for electrolyte repletion.  Dyslipidemia On statin therapy.  Hypotension due to hypovolemia BP stable off midodrine.  Protein-calorie malnutrition, severe Anorexia Continue Remeron Encourage oral diet, supplements. Assist with feeding.  Pancytopenia (HCC) Due to COVID. Counts improved.  Anemia in chronic kidney disease (CKD) HgB stable around 11. No active bleeding.     Out of bed to chair. Incentive spirometry when awake. Nursing supportive care. Fall, aspiration precautions. DVT prophylaxis   Code Status: Full Code  Subjective: Patient is seen and examined today morning. He is alert, confused, did not recognize his brother at bedside. Has mittens for safety. Eating very poor. Brother understands poor prognosis.  Physical Exam: Vitals:   03/17/23 2311 03/18/23 0100 03/18/23 0820 03/18/23 1536  BP: 113/74  115/68 109/71  Pulse: (!) 56  72 73  Resp: 16  16 16   Temp: 98.1 F (36.7 C)  (!) 97.5 F (36.4 C) 97.9 F (36.6 C)  TempSrc:   Oral Oral  SpO2: 96%  99% (!) 88%  Weight:      Height:  5' 5.5" (1.664 m)      General - Elderly ill Caucasian male, alert, no distress. HEENT - PERRLA, EOMI, atraumatic head, non tender sinuses. Lung - Clear, basal rales, rhonchi, no wheezes. Heart - S1, S2 heard, no murmurs, rubs, trace pedal edema. Abdomen - Soft, non tender, bowel sounds good Neuro -alert, confused, does not follow commands, unable to do full neuro  exam. Skin - Warm and dry.  Data Reviewed:      Latest Ref Rng & Units 03/18/2023    5:29 AM 03/14/2023    8:38 AM 03/09/2023    5:57 AM  CBC  WBC 4.0 - 10.5 K/uL 6.1  8.4  5.5   Hemoglobin 13.0 - 17.0 g/dL 16.1  09.6  04.5   Hematocrit 39.0 - 52.0 % 31.7  35.7  32.1   Platelets 150 - 400 K/uL 232  258  145        Latest Ref Rng & Units 03/18/2023    5:29 AM 03/17/2023    4:54 AM 03/16/2023    4:46 AM  BMP  Glucose 70 - 99 mg/dL 409  811  914   BUN 8 - 23 mg/dL 50  52  57   Creatinine 0.61 - 1.24 mg/dL 7.82  9.56  2.13   Sodium 135 - 145 mmol/L 161  155  160   Potassium 3.5 - 5.1 mmol/L 3.9  3.1  3.8   Chloride 98 - 111 mmol/L 125  121  121   CO2 22 - 32 mmol/L 29  27  27    Calcium 8.9 - 10.3 mg/dL 8.4  8.0  8.5    No results found.  Family Communication: Discussed with brother at bedside, he understands patient's current clinical condition, poor prognosis.  Brother agreed with palliative care upon discharge but wants to take him home.  Disposition: Status is: Inpatient Remains inpatient appropriate because: weak, poor appetite, discharged with home health comfort care  Planned Discharge Destination: Home with Home Health palliative care.     Time spent: 42 minutes  Author: Marcelino Duster, MD 03/18/2023 4:41 PM Secure chat 7am to 7pm For on call review [email or web address].

## 2023-03-18 NOTE — TOC Progression Note (Addendum)
Transition of Care [facility]) - Progression Note    Patient Details  Name: BRIAN ZEITLIN MRN: [id number] Date of Birth: May 07, 1945  Transition [facility]) CM/SW Contact  Truddie Hidden, RN Phone Number: 03/18/2023, 3:13 PM  Clinical Narrative:    Attempt to reach patient's brother regarding discharge plans @ [phone]. No answer. Left a message.   4:30pm Attempt to reach patient's sister regarding discharge plan. No answer. Left a message.         Expected Discharge Plan and Services                                               Social Determinants of Health (SDOH) Interventions SDOH Screenings   Food Insecurity: Patient Unable To Answer (03/08/2023)  Housing: Patient Unable To Answer (03/08/2023)  Transportation Needs: Patient Unable To Answer (03/08/2023)  Utilities: Patient Unable To Answer (03/08/2023)  Alcohol Screen: Low Risk  (08/08/2021)  Depression (PHQ2-9): Low Risk  (12/03/2022)  Financial Resource Strain: Low Risk  (08/08/2021)  Physical Activity: Insufficiently Active (08/08/2021)  Social Connections: Unknown (03/08/2023)  Stress: No Stress Concern Present (08/08/2021)  Tobacco Use: Medium Risk (12/03/2022)    Readmission Risk Interventions     No data to display

## 2023-03-19 DIAGNOSIS — U071 COVID-19: Secondary | ICD-10-CM | POA: Diagnosis not present

## 2023-03-19 DIAGNOSIS — Z515 Encounter for palliative care: Secondary | ICD-10-CM | POA: Diagnosis not present

## 2023-03-19 DIAGNOSIS — W19XXXA Unspecified fall, initial encounter: Secondary | ICD-10-CM | POA: Diagnosis not present

## 2023-03-19 DIAGNOSIS — M6282 Rhabdomyolysis: Secondary | ICD-10-CM | POA: Diagnosis not present

## 2023-03-19 DIAGNOSIS — E43 Unspecified severe protein-calorie malnutrition: Secondary | ICD-10-CM | POA: Diagnosis not present

## 2023-03-19 DIAGNOSIS — H66002 Acute suppurative otitis media without spontaneous rupture of ear drum, left ear: Secondary | ICD-10-CM | POA: Diagnosis not present

## 2023-03-19 DIAGNOSIS — E876 Hypokalemia: Secondary | ICD-10-CM | POA: Diagnosis not present

## 2023-03-19 LAB — BASIC METABOLIC PANEL
Anion gap: 6 (ref 5–15)
BUN: 54 mg/dL — ABNORMAL HIGH (ref 8–23)
CO2: 28 mmol/L (ref 22–32)
Calcium: 8.2 mg/dL — ABNORMAL LOW (ref 8.9–10.3)
Chloride: 124 mmol/L — ABNORMAL HIGH (ref 98–111)
Creatinine, Ser: 2.06 mg/dL — ABNORMAL HIGH (ref 0.61–1.24)
GFR, Estimated: 32 mL/min — ABNORMAL LOW (ref >60)
Glucose, Bld: 124 mg/dL — ABNORMAL HIGH (ref 70–99)
Potassium: 3.7 mmol/L (ref 3.5–5.1)
Sodium: 158 mmol/L — ABNORMAL HIGH (ref 135–145)

## 2023-03-19 LAB — GLUCOSE, CAPILLARY
Glucose-Capillary: 109 mg/dL — ABNORMAL HIGH (ref 70–99)
Glucose-Capillary: 101 mg/dL — ABNORMAL HIGH (ref 70–99)
Glucose-Capillary: 96 mg/dL (ref 70–99)

## 2023-03-19 LAB — PHOSPHORUS: Phosphorus: 2.9 mg/dL (ref 2.5–4.6)

## 2023-03-19 MED ORDER — ENOXAPARIN SODIUM 30 MG/0.3ML IJ SOSY
30.0000 mg | PREFILLED_SYRINGE | INTRAMUSCULAR | Status: DC
  Administered 2023-03-20: 30 mg via SUBCUTANEOUS
  Filled 2023-03-19: qty 0.3

## 2023-03-19 NOTE — Consult Note (Signed)
[facility] Liaison Note  03/19/2023  Drew Russell December 31, 1945 [id number]  Location: RN Hospital Liaison screened the patient remotely at [facility].  Insurance: [employer]   Drew Russell is a 78 y.o. male who is a Primary Care Patient of Larae Grooms, NP  [facility]. The patient was screened for Readmission hospitalization with noted high risk score for unplanned readmission risk with 1 IP in 6 months.  The patient was assessed for potential Care Management service needs for post hospital transition for care coordination. Review of patient's electronic medical record reveals patient was admitted with Non-traumatic Rhabdomyolysis. Pt recommended for SNF level of care for ongoing STR. Currently pending bed offers to the family. If pt is discharged to an affiliated facility will a collaborate with the VBCI PAC-RN. If pt is discharged to an unaffiliated facility the facility will continue to address pt's ongoing needs. No needs for [facility]BCI at this time pending discharge disposition.  Plan: [facility] Liaison will continue to follow progress and disposition to asess for post hospital community care coordination/management needs.  Referral request for community care coordination: pending disposition.   [facility]I Care Management/Population Health does not replace or interfere with any arrangements made by the Inpatient Transition of Care team.   For questions contact:   Elliot Cousin, RN, [facility] Liaison [place]   [facility], Population Health Office Hours MTWF  8:00 am-6:00 pm Direct Dial: [phone] mobile [phone] [Office toll free line] Office Hours are M-F 8:30 - 5 pm [email or web address]

## 2023-03-19 NOTE — Plan of Care (Signed)
  Problem: Pain Managment: Goal: General experience of comfort will improve and/or be controlled Outcome: Progressing   Problem: Safety: Goal: Ability to remain free from injury will improve Outcome: Progressing   Problem: Skin Integrity: Goal: Risk for impaired skin integrity will decrease Outcome: Progressing   Problem: Education: Goal: Knowledge of risk factors and measures for prevention of condition will improve Outcome: Not Progressing   Problem: Coping: Goal: Psychosocial and spiritual needs will be supported Outcome: Not Progressing   Problem: Respiratory: Goal: Will maintain a patent airway Outcome: Not Progressing Goal: Complications related to the disease process, condition or treatment will be avoided or minimized Outcome: Not Progressing   Problem: Education: Goal: Knowledge of General Education information will improve Description: Including pain rating scale, medication(s)/side effects and non-pharmacologic comfort measures Outcome: Not Progressing   Problem: Health Behavior/Discharge Planning: Goal: Ability to manage health-related needs will improve Outcome: Not Progressing   Problem: Clinical Measurements: Goal: Ability to maintain clinical measurements within normal limits will improve Outcome: Not Progressing Goal: Will remain free from infection Outcome: Not Progressing Goal: Diagnostic test results will improve Outcome: Not Progressing Goal: Respiratory complications will improve Outcome: Not Progressing Goal: Cardiovascular complication will be avoided Outcome: Not Progressing   Problem: Activity: Goal: Risk for activity intolerance will decrease Outcome: Not Progressing   Problem: Nutrition: Goal: Adequate nutrition will be maintained Outcome: Not Progressing   Problem: Coping: Goal: Level of anxiety will decrease Outcome: Not Progressing   Problem: Elimination: Goal: Will not experience complications related to bowel  motility Outcome: Not Progressing Goal: Will not experience complications related to urinary retention Outcome: Not Progressing

## 2023-03-19 NOTE — TOC Progression Note (Signed)
Transition of Care [facility]) - Progression Note    Patient Details  Name: Drew Russell MRN: [id number] Date of Birth: 06-20-1945  Transition of Care [facility]) CM/SW Contact  Marlowe Sax, RN Phone Number: 03/19/2023, 4:41 PM  Clinical Narrative:    Went into the room to provide choice for Hospice agencies, family not present, called Brother Richard and there was no answer, called sister  [name] and left a general VM asking for a call back                                         Expected Discharge Plan and Services                                               Social Determinants of Health (SDOH) Interventions SDOH Screenings   Food Insecurity: Patient Unable To Answer (03/08/2023)  Housing: Patient Unable To Answer (03/08/2023)  Transportation Needs: Patient Unable To Answer (03/08/2023)  Utilities: Patient Unable To Answer (03/08/2023)  Alcohol Screen: Low Risk  (08/08/2021)  Depression (PHQ2-9): Low Risk  (12/03/2022)  Financial Resource Strain: Low Risk  (08/08/2021)  Physical Activity: Insufficiently Active (08/08/2021)  Social Connections: Unknown (03/08/2023)  Stress: No Stress Concern Present (08/08/2021)  Tobacco Use: Medium Risk (12/03/2022)    Readmission Risk Interventions     No data to display

## 2023-03-19 NOTE — Progress Notes (Signed)
Progress Note   Patient: Drew Russell:[id number] DOB: May 02, 1945 DOA: 03/04/2023     14 DOS: the patient was seen and examined on 03/19/2023   Brief hospital course: HPI: Drew Russell is a 78 y.o. Caucasian male with medical history significant for hypertension and dyslipidemia as well as dementia, presented to the emergency room with acute onset of altered mental status and fall.  Apparently fell at home yesterday and his brother was feeding him while he was on the floor waiting for him to get enough energy to get up.  He denied any chest pain or palpitations earlier.  No cough or wheezing or dyspnea.  No neck pain or stiffness.  No fever or chills.  He depleted and dehydrated with cracked lips and dry mucous membranes.  he was very somnolent during my interview and only responsive to painful stimuli and therefore no further history could be obtained.   During hospital stay, his CK level, potassium improved, patient is 10 days past COVID virus infection, not hypoxic.  Patient seems to be confused at baseline and does not wish to eat or get out of bed.  Patient is severely malnourished with moderate fat depletion, weight loss.  Patient's brother understands poor prognosis agreed with comfort care only, wishes to take him home with hospice.  Assessment and Plan: * Non-traumatic rhabdomyolysis Acute metabolic encephalopathy Poor oral intake, anorexia Severe protein calorie malnutrition Patient is very weak, eating poor. Mental status poor, confused, unable to have a meaningful conversation. Brother at bedside wishes for palliative care upon discharge. TOC to work on Home with hospice.  COVID-19 virus infection Dc isolation, as he is more than 10 days past COVID Continue supportive care. Fall, aspiration precautions.  Acute on chronic kidney disease stage III. Worsening kidney function due to poor oral intake. Gentle IV fluids with D5W Avoid nephrotoxic drugs.  Hypernatremia  - Worsening in the setting of dehydration, poor oral intake. Continue gentle IV hydration. Trend Sodium.  Hypokalemia K 3.1 IV Kcl with fluids ordered. Pharmacy on board for electrolyte repletion.  Dyslipidemia On statin therapy.  Hypotension due to hypovolemia BP stable off midodrine.  Protein-calorie malnutrition, severe Anorexia Continue Remeron Encourage oral diet, supplements. Assist with feeding.  Pancytopenia (HCC) Due to COVID. Counts improved.  Anemia in chronic kidney disease (CKD) HgB stable around 11. No active bleeding.     Out of bed to chair. Incentive spirometry when awake. Nursing supportive care. Fall, aspiration precautions. DVT prophylaxis   Code Status: Full Code  Subjective: Patient is seen and examined today morning. He is alert, confused, very poor eating. No family at bedside.  Physical Exam: Vitals:   03/18/23 1536 03/18/23 2335 03/19/23 0817 03/19/23 1529  BP: 109/71 106/70 92/61 127/66  Pulse: 73 72 78 61  Resp: 16 16 15 16   Temp: 97.9 F (36.6 C) 98 F (36.7 C) 97.6 F (36.4 C) 97.9 F (36.6 C)  TempSrc: Oral Oral Oral Oral  SpO2: (!) 88% 99% 92% 95%  Weight:      Height:        General - Elderly ill Caucasian male, alert, no distress. HEENT - PERRLA, EOMI, atraumatic head, non tender sinuses. Lung - Clear, basal rales, rhonchi, no wheezes. Heart - S1, S2 heard, no murmurs, rubs, trace pedal edema. Abdomen - Soft, non tender, bowel sounds good Neuro -alert, confused, does not follow commands, unable to do full neuro exam. Skin - Warm and dry.  Data Reviewed:  Latest Ref Rng & Units 03/18/2023    5:29 AM 03/14/2023    8:38 AM 03/09/2023    5:57 AM  CBC  WBC 4.0 - 10.5 K/uL 6.1  8.4  5.5   Hemoglobin 13.0 - 17.0 g/dL 62.1  30.8  65.7   Hematocrit 39.0 - 52.0 % 31.7  35.7  32.1   Platelets 150 - 400 K/uL 232  258  145       Latest Ref Rng & Units 03/19/2023    4:36 AM 03/18/2023    5:29 AM 03/17/2023    4:54  AM  BMP  Glucose 70 - 99 mg/dL 846  962  952   BUN 8 - 23 mg/dL 54  50  52   Creatinine 0.61 - 1.24 mg/dL 8.41  3.24  4.01   Sodium 135 - 145 mmol/L 158  161  155   Potassium 3.5 - 5.1 mmol/L 3.7  3.9  3.1   Chloride 98 - 111 mmol/L 124  125  121   CO2 22 - 32 mmol/L 28  29  27    Calcium 8.9 - 10.3 mg/dL 8.2  8.4  8.0    No results found.  Family Communication: Discussed with brother at bedside yesterday, he understands patient's current clinical condition, poor prognosis.  Brother agreed with palliative care upon discharge but wants to take him home.  Disposition: Status is: Inpatient Remains inpatient appropriate because: weak, poor appetite, discharged with home health comfort care  Planned Discharge Destination:  Home with hospice       Time spent: 42 minutes  Author: Marcelino Duster, MD 03/19/2023 4:21 PM Secure chat 7am to 7pm For on call review [email or web address]

## 2023-03-19 NOTE — Plan of Care (Signed)
  Problem: Respiratory: Goal: Will maintain a patent airway Outcome: Progressing   Problem: Clinical Measurements: Goal: Will remain free from infection Outcome: Progressing   Problem: Education: Goal: Knowledge of risk factors and measures for prevention of condition will improve Outcome: Not Progressing

## 2023-03-19 NOTE — Progress Notes (Signed)
Palliative Care Progress Note, Assessment & Plan   Patient Name: Drew Russell       Date: 03/19/2023 DOB: 04/07/1945  Age: 78 y.o. MRN#: [id number] Attending Physician: Marcelino Duster, MD Primary Care Physician: Larae Grooms, NP Admit Date: 03/04/2023  Subjective: Patient is lying in bed with IV fluids in place.  He has a wrap around his right arm.  He is fidgeting but not touching his medical tubes/lines.  He acknowledges my presence.  No family or friends present during my visit.  He makes statements but does not engage in discussions or conversation with me.  HPI: 78 y.o. male  with past medical history of dementia, HTN and HLD admitted from home on 03/04/2023 s/p fall.   Found to have elevated CK and COVID positive CT head (-) and CXR (-)   Admitted and being treated for rhabdomyolysis, COVID, encephalopathy and hypotension CK levels trended down   Course has been complicated by ongoing encephalopathy without significant improvement, minimal PO intake contributing to hypernatremia   Palliative medicine was consulted for assisting with goals of care conversations.  Summary of counseling/coordination of care: Extensive chart review completed prior to meeting patient including labs, vital signs, imaging, progress notes, orders, and available advanced directive documents from current and previous encounters.   After reviewing the patient's chart, the patient at bedside.  Patient has no signs of distress noted.  He endorses no pain or discomfort at this time.  He is unable to engage in linear discussion with me.  He shares that he would like for "it to be put on the list of things to do".  Patient remains unable to engage in goals of care or medical decision making independently at this  time.    After assessing the patient at bedside, I spoke with RN in regards to patient's plan of care.  She endorses patient has had little/minimal p.o. intake.  He has been compliant and not displaying signs of pain/agitation.  However, he has not been able to engage appropriately in discussions with her today either.  After speaking with dayshift RN, I counseled with attending in regard to plan of care.   As per attending, family wishes to have patient receive full treatment during this hospitalization and then to discharge home with hospice services to follow.    No shift in plan of care to comfort measures at this time.  Full code and full scope remain during this hospitalization.    TOC following closely for discharge planning with hospice services to follow.  No acute palliative needs at this time.  PMT remains available to patient and family throughout his hospitalization and will continue to follow and support when appropriate.   Physical Exam Vitals reviewed.  HENT:     Head: Normocephalic.     Mouth/Throat:     Mouth: Mucous membranes are moist.  Eyes:     Pupils: Pupils are equal, round, and reactive to light.  Pulmonary:     Effort: Pulmonary effort is normal.  Abdominal:     Palpations: Abdomen is soft.  Neurological:     Mental Status: He is alert.     Comments: Oriented to self  Total Time 35 minutes   Time spent includes: Detailed review of medical records (labs, imaging, vital signs), medically appropriate exam (mental status, respiratory, cardiac, skin), discussed with treatment team, counseling and educating patient, family and staff, documenting clinical information, medication management and coordination of care.  Samara Deist L. Bonita Quin, DNP, FNP-BC Palliative Medicine Team

## 2023-03-19 NOTE — Progress Notes (Signed)
PHARMACY CONSULT NOTE  Pharmacy Consult for Electrolyte Monitoring and Replacement   Recent Labs: Potassium (mmol/L)  Date Value  03/19/2023 3.7   Magnesium (mg/dL)  Date Value  16/11/9602 3.3 (H)   Calcium (mg/dL)  Date Value  54/10/8117 8.2 (L)   Albumin (g/dL)  Date Value  14/78/2956 3.1 (L)  12/03/2022 4.0   Phosphorus (mg/dL)  Date Value  21/30/8657 2.9   Sodium (mmol/L)  Date Value  03/19/2023 158 (H)  12/03/2022 139   Assessment: 78 y.o. male w/ PMH of HTN, HLD, dementia presented w/ AMS and s/p mechanical fall. Pharmacy is asked to follow and replace electrolytes,  Goal of Therapy:  Electrolytes WNL  Plan:  --Hypernatremia to be managed by primary team. Recommend re-starting D5w at higher rate given increase in sodium --No electrolyte replacement indicated for today --Re-check electrolytes in AM  Thank you for involving pharmacy in this patient's care.   Rockwell Alexandria, PharmD Clinical Pharmacist 03/19/2023 7:26 AM

## 2023-03-20 DIAGNOSIS — Z515 Encounter for palliative care: Secondary | ICD-10-CM | POA: Diagnosis not present

## 2023-03-20 DIAGNOSIS — E861 Hypovolemia: Secondary | ICD-10-CM | POA: Diagnosis not present

## 2023-03-20 DIAGNOSIS — H66002 Acute suppurative otitis media without spontaneous rupture of ear drum, left ear: Secondary | ICD-10-CM | POA: Diagnosis not present

## 2023-03-20 DIAGNOSIS — E876 Hypokalemia: Secondary | ICD-10-CM | POA: Diagnosis not present

## 2023-03-20 DIAGNOSIS — U071 COVID-19: Secondary | ICD-10-CM | POA: Diagnosis not present

## 2023-03-20 DIAGNOSIS — G9341 Metabolic encephalopathy: Secondary | ICD-10-CM | POA: Diagnosis not present

## 2023-03-20 DIAGNOSIS — Z7189 Other specified counseling: Secondary | ICD-10-CM

## 2023-03-20 DIAGNOSIS — M6282 Rhabdomyolysis: Secondary | ICD-10-CM | POA: Diagnosis not present

## 2023-03-20 DIAGNOSIS — E43 Unspecified severe protein-calorie malnutrition: Secondary | ICD-10-CM | POA: Diagnosis not present

## 2023-03-20 LAB — BASIC METABOLIC PANEL
Anion gap: 8 (ref 5–15)
BUN: 53 mg/dL — ABNORMAL HIGH (ref 8–23)
CO2: 27 mmol/L (ref 22–32)
Calcium: 8 mg/dL — ABNORMAL LOW (ref 8.9–10.3)
Chloride: 124 mmol/L — ABNORMAL HIGH (ref 98–111)
Creatinine, Ser: 2.03 mg/dL — ABNORMAL HIGH (ref 0.61–1.24)
GFR, Estimated: 33 mL/min — ABNORMAL LOW (ref >60)
Glucose, Bld: 93 mg/dL (ref 70–99)
Potassium: 3.7 mmol/L (ref 3.5–5.1)
Sodium: 159 mmol/L — ABNORMAL HIGH (ref 135–145)

## 2023-03-20 LAB — GLUCOSE, CAPILLARY
Glucose-Capillary: 88 mg/dL (ref 70–99)
Glucose-Capillary: 84 mg/dL (ref 70–99)

## 2023-03-20 LAB — PHOSPHORUS: Phosphorus: 3.1 mg/dL (ref 2.5–4.6)

## 2023-03-20 MED ORDER — HALOPERIDOL LACTATE 2 MG/ML PO CONC: 0.5000 mg | ORAL | Status: DC | PRN

## 2023-03-20 MED ORDER — HALOPERIDOL 0.5 MG PO TABS: 0.5000 mg | ORAL_TABLET | ORAL | Status: DC | PRN

## 2023-03-20 MED ORDER — GLYCOPYRROLATE 0.2 MG/ML IJ SOLN: 0.2000 mg | INTRAMUSCULAR | Status: DC | PRN

## 2023-03-20 MED ORDER — HALOPERIDOL LACTATE 5 MG/ML IJ SOLN: 0.5000 mg | INTRAMUSCULAR | Status: DC | PRN

## 2023-03-20 MED ORDER — BIOTENE DRY MOUTH MT LIQD: 15.0000 mL | OROMUCOSAL | Status: DC | PRN

## 2023-03-20 MED ORDER — POLYVINYL ALCOHOL 1.4 % OP SOLN: 1.0000 [drp] | Freq: Four times a day (QID) | OPHTHALMIC | Status: DC | PRN

## 2023-03-20 MED ORDER — GLYCOPYRROLATE 1 MG PO TABS: 1.0000 mg | ORAL_TABLET | ORAL | Status: DC | PRN

## 2023-03-20 NOTE — Plan of Care (Signed)

## 2023-03-20 NOTE — Progress Notes (Signed)
Progress Note   Patient: Drew Russell YNW:[id number] DOB: 10-28-1945 DOA: 03/04/2023     15 DOS: the patient was seen and examined on 03/20/2023   Brief hospital course: HPI: CHUCK CABAN is a 78 y.o. Caucasian male with medical history significant for hypertension and dyslipidemia as well as dementia, presented to the emergency room with acute onset of altered mental status and fall.  Apparently fell at home yesterday and his brother was feeding him while he was on the floor waiting for him to get enough energy to get up.  He denied any chest pain or palpitations earlier.  No cough or wheezing or dyspnea.  No neck pain or stiffness.  No fever or chills.  He depleted and dehydrated with cracked lips and dry mucous membranes.  he was very somnolent during my interview and only responsive to painful stimuli and therefore no further history could be obtained.   During hospital stay, his CK level, potassium improved, patient is 10 days past COVID virus infection, not hypoxic.  Patient seems to be confused at baseline and does not wish to eat or get out of bed.  Patient is severely malnourished with moderate fat depletion, weight loss.  Patient's brother understands poor prognosis agreed with comfort care only, wishes to take him home with hospice.  Assessment and Plan: * Non-traumatic rhabdomyolysis Acute metabolic encephalopathy Poor oral intake, anorexia Severe protein calorie malnutrition Patient is very weak, eating poor. Mental status poor, confused, unable to have a meaningful conversation. Brother at bedside wishes for palliative care upon discharge. TOC to work on Home with hospice.  COVID-19 virus infection Continue supportive care. Fall, aspiration precautions.  Acute on chronic kidney disease stage III. Worsening kidney function due to poor oral intake. Gentle IV fluids with D5W Avoid nephrotoxic drugs.  Hypernatremia - Worsening in the setting of dehydration, poor oral  intake. Continue gentle IV hydration. Trend Sodium.  Hypokalemia Pharmacy on board for electrolyte repletion.  Dyslipidemia On statin therapy.  Hypotension due to hypovolemia BP stable off midodrine.  Protein-calorie malnutrition, severe Anorexia Continue Remeron Encourage oral diet, supplements. Assist with feeding.  Pancytopenia (HCC) Due to COVID. Counts improved.  Anemia in chronic kidney disease (CKD) HgB stable around 11. No active bleeding.     After discussing with brother CODE STATUS changed to DNR - Comfort care. End of life orders placed.   Code Status: Do not attempt resuscitation (DNR) - Comfort care  Subjective: Patient is seen and examined today morning. He is sleeping, confused, not eating at all. Did not get out of bed.  Physical Exam: Vitals:   03/19/23 0817 03/19/23 1529 03/19/23 2143 03/20/23 0730  BP: 92/61 127/66 113/63 106/64  Pulse: 78 61 65 70  Resp: 15 16 16 16   Temp: 97.6 F (36.4 C) 97.9 F (36.6 C) 98.2 F (36.8 C) 97.8 F (36.6 C)  TempSrc: Oral Oral    SpO2: 92% 95% 100% 100%  Weight:      Height:        General - Elderly ill Caucasian male, alert, no distress. HEENT - PERRLA, EOMI, atraumatic head, non tender sinuses. Lung - Clear, basal rales, rhonchi, no wheezes. Heart - S1, S2 heard, no murmurs, rubs, trace pedal edema. Abdomen - Soft, non tender, bowel sounds good Neuro -alert, confused, does not follow commands, unable to do full neuro exam. Skin - Warm and dry.  Data Reviewed:      Latest Ref Rng & Units 03/18/2023    5:29  AM 03/14/2023    8:38 AM 03/09/2023    5:57 AM  CBC  WBC 4.0 - 10.5 K/uL 6.1  8.4  5.5   Hemoglobin 13.0 - 17.0 g/dL 16.1  09.6  04.5   Hematocrit 39.0 - 52.0 % 31.7  35.7  32.1   Platelets 150 - 400 K/uL 232  258  145       Latest Ref Rng & Units 03/20/2023    5:06 AM 03/19/2023    4:36 AM 03/18/2023    5:29 AM  BMP  Glucose 70 - 99 mg/dL 93  409  811   BUN 8 - 23 mg/dL 53  54  50    Creatinine 0.61 - 1.24 mg/dL 9.14  7.82  9.56   Sodium 135 - 145 mmol/L 159  158  161   Potassium 3.5 - 5.1 mmol/L 3.7  3.7  3.9   Chloride 98 - 111 mmol/L 124  124  125   CO2 22 - 32 mmol/L 27  28  29    Calcium 8.9 - 10.3 mg/dL 8.0  8.2  8.4    No results found.  Family Communication: Discussed with brother at bedside, he understands patient's current clinical condition, poor prognosis.  Brother agreed with home with hospice upon discharge.  Disposition: Status is: Inpatient Remains inpatient appropriate because: weak, poor appetite, discharge with home health comfort care  Planned Discharge Destination:  Home with hospice       Time spent: 38 minutes  Author: Marcelino Duster, MD 03/20/2023 1:23 PM Secure chat 7am to 7pm For on call review [email or web address].

## 2023-03-20 NOTE — Plan of Care (Signed)
Problem: Education: Goal: Knowledge of risk factors and measures for prevention of condition will improve 03/20/2023 1808 by Londell Moh, RN Outcome: Not Progressing 03/20/2023 1807 by Londell Moh, RN Outcome: Progressing   Problem: Coping: Goal: Psychosocial and spiritual needs will be supported 03/20/2023 1808 by Londell Moh, RN Outcome: Not Progressing 03/20/2023 1807 by Londell Moh, RN Outcome: Progressing   Problem: Respiratory: Goal: Will maintain a patent airway 03/20/2023 1808 by Londell Moh, RN Outcome: Not Progressing 03/20/2023 1807 by Londell Moh, RN Outcome: Progressing Goal: Complications related to the disease process, condition or treatment will be avoided or minimized 03/20/2023 1808 by Londell Moh, RN Outcome: Not Progressing 03/20/2023 1807 by Londell Moh, RN Outcome: Progressing   Problem: Education: Goal: Knowledge of General Education information will improve Description: Including pain rating scale, medication(s)/side effects and non-pharmacologic comfort measures 03/20/2023 1808 by Londell Moh, RN Outcome: Not Progressing 03/20/2023 1807 by Londell Moh, RN Outcome: Progressing   Problem: Health Behavior/Discharge Planning: Goal: Ability to manage health-related needs will improve 03/20/2023 1808 by Londell Moh, RN Outcome: Not Progressing 03/20/2023 1807 by Londell Moh, RN Outcome: Progressing   Problem: Clinical Measurements: Goal: Ability to maintain clinical measurements within normal limits will improve 03/20/2023 1808 by Londell Moh, RN Outcome: Not Progressing 03/20/2023 1807 by Londell Moh, RN Outcome: Progressing Goal: Will remain free from infection 03/20/2023 1808 by Londell Moh, RN Outcome: Not Progressing 03/20/2023 1807 by Londell Moh, RN Outcome: Progressing Goal: Diagnostic test results will improve 03/20/2023 1808 by Londell Moh, RN Outcome: Not Progressing 03/20/2023 1807 by Londell Moh, RN Outcome: Progressing Goal: Respiratory complications will improve 03/20/2023 1808 by Londell Moh, RN Outcome: Not Progressing 03/20/2023 1807 by Londell Moh, RN Outcome: Progressing Goal: Cardiovascular complication will be avoided 03/20/2023 1808 by Londell Moh, RN Outcome: Not Progressing 03/20/2023 1807 by Londell Moh, RN Outcome: Progressing   Problem: Activity: Goal: Risk for activity intolerance will decrease 03/20/2023 1808 by Londell Moh, RN Outcome: Not Progressing 03/20/2023 1807 by Londell Moh, RN Outcome: Progressing   Problem: Nutrition: Goal: Adequate nutrition will be maintained 03/20/2023 1808 by Londell Moh, RN Outcome: Not Progressing 03/20/2023 1807 by Londell Moh, RN Outcome: Progressing   Problem: Coping: Goal: Level of anxiety will decrease 03/20/2023 1808 by Londell Moh, RN Outcome: Not Progressing 03/20/2023 1807 by Londell Moh, RN Outcome: Progressing   Problem: Elimination: Goal: Will not experience complications related to bowel motility 03/20/2023 1808 by Londell Moh, RN Outcome: Not Progressing 03/20/2023 1807 by Londell Moh, RN Outcome: Progressing Goal: Will not experience complications related to urinary retention 03/20/2023 1808 by Londell Moh, RN Outcome: Not Progressing 03/20/2023 1807 by Londell Moh, RN Outcome: Progressing   Problem: Pain Managment: Goal: General experience of comfort will improve and/or be controlled 03/20/2023 1808 by Londell Moh, RN Outcome: Not Progressing 03/20/2023 1807 by Londell Moh, RN Outcome: Progressing   Problem: Safety: Goal: Ability to remain free from injury will improve 03/20/2023 1808 by Londell Moh, RN Outcome: Not Progressing 03/20/2023 1807 by Londell Moh, RN Outcome: Progressing   Problem: Skin Integrity: Goal: Risk for impaired skin integrity will decrease 03/20/2023 1808 by Londell Moh, RN Outcome: Not Progressing 03/20/2023 1807  by Londell Moh, RN Outcome: Progressing   Problem: Education: Goal: Knowledge of the prescribed therapeutic regimen will improve 03/20/2023 1808 by Londell Moh, RN Outcome: Not Progressing 03/20/2023 1807 by Londell Moh, RN Outcome: Progressing   Problem: Coping: Goal: Ability to identify and develop effective coping behavior will improve 03/20/2023 1808 by Londell Moh, RN Outcome: Not  Progressing 03/20/2023 1807 by Londell Moh, RN Outcome: Progressing   Problem: Clinical Measurements: Goal: Quality of life will improve 03/20/2023 1808 by Londell Moh, RN Outcome: Not Progressing 03/20/2023 1807 by Londell Moh, RN Outcome: Progressing   Problem: Respiratory: Goal: Verbalizations of increased ease of respirations will increase 03/20/2023 1808 by Londell Moh, RN Outcome: Not Progressing 03/20/2023 1807 by Londell Moh, RN Outcome: Progressing   Problem: Role Relationship: Goal: Family's ability to cope with current situation will improve 03/20/2023 1808 by Londell Moh, RN Outcome: Not Progressing 03/20/2023 1807 by Londell Moh, RN Outcome: Progressing Goal: Ability to verbalize concerns, feelings, and thoughts to partner or family member will improve 03/20/2023 1808 by Londell Moh, RN Outcome: Not Progressing 03/20/2023 1807 by Londell Moh, RN Outcome: Progressing   Problem: Pain Management: Goal: Satisfaction with pain management regimen will improve 03/20/2023 1808 by Londell Moh, RN Outcome: Not Progressing 03/20/2023 1807 by Londell Moh, RN Outcome: Progressing

## 2023-03-20 NOTE — Progress Notes (Signed)
PHARMACY CONSULT NOTE  Pharmacy Consult for Electrolyte Monitoring and Replacement   Recent Labs: Potassium (mmol/L)  Date Value  03/20/2023 3.7   Magnesium (mg/dL)  Date Value  16/11/9602 3.3 (H)   Calcium (mg/dL)  Date Value  54/10/8117 8.0 (L)   Albumin (g/dL)  Date Value  14/78/2956 3.1 (L)  12/03/2022 4.0   Phosphorus (mg/dL)  Date Value  21/30/8657 3.1   Sodium (mmol/L)  Date Value  03/20/2023 159 (H)  12/03/2022 139   Assessment: 78 y.o. male w/ PMH of HTN, HLD, dementia presented w/ AMS and s/p mechanical fall. Pharmacy is asked to follow and replace electrolytes,  Goal of Therapy:  Electrolytes WNL  Plan:  --Hypernatremia to be managed by primary team. Recommend re-starting D5w at higher rate given increase in sodium --No electrolyte replacement indicated for today --Re-check electrolytes in AM  Thank you for involving pharmacy in this patient's care.   Bettey Costa, PharmD Clinical Pharmacist 03/20/2023 7:43 AM

## 2023-03-20 NOTE — Progress Notes (Signed)
[facility] Liaison Note  New referral received from [facility], Josiah Lobo, for Hospice services at home. HL, Glenna Fellows, RN, unable to reach patient's brother and sister to discuss Hospice services. Voicemail message left with brother.  Awaiting a return phone call.   Please don't hesitate to call with any Hospice related questions or concerns.    Thank you for the opportunity to participate in this patient's care.  [facility] Liaison [phone]

## 2023-03-20 NOTE — TOC Progression Note (Addendum)
Transition of Care [facility]) - Progression Note    Patient Details  Name: Drew Russell MRN: [id number] Date of Birth: 01/11/1946  Transition of Care [facility]) CM/SW Contact  Marlowe Sax, RN Phone Number: 03/20/2023, 1:43 PM  Clinical Narrative:     Sherron Monday with Merrily Pew the patient's brother, reviewed each of the Hospice agencies and star rating, he has another family member that has used [facility] in the past and would like to use them as well, I notified Ree Kida and asked them to reach out to [employer] [facility]                                               Social Determinants of Health (SDOH) Interventions SDOH Screenings   Food Insecurity: Patient Unable To Answer (03/08/2023)  Housing: Patient Unable To Answer (03/08/2023)  Transportation Needs: Patient Unable To Answer (03/08/2023)  Utilities: Patient Unable To Answer (03/08/2023)  Alcohol Screen: Low Risk  (08/08/2021)  Depression (PHQ2-9): Low Risk  (12/03/2022)  Financial Resource Strain: Low Risk  (08/08/2021)  Physical Activity: Insufficiently Active (08/08/2021)  Social Connections: Unknown (03/08/2023)  Stress: No Stress Concern Present (08/08/2021)  Tobacco Use: Medium Risk (12/03/2022)    Readmission Risk Interventions     No data to display

## 2023-03-20 NOTE — IPAL (Addendum)
Advance Care Planning    Drew Russell is a 78 y.o. Caucasian male with medical history significant for hypertension and dyslipidemia as well as dementia, presented to the emergency room with acute onset of altered mental status and fall.   During his hospital stay patient is treated for rhabdomyolysis, COVID-19 virus infection, kidney injury.  Patient's mental status is poor, not eating, not getting out of bed.  He is severely malnourished, dehydrated, has electrolyte abnormalities.  His overall condition is deteriorating.  I discussed with patient's brother Drew Russell.  He understands patient's poor medical condition, and prognosis.  Patient brother states that his mother too had similar kind of deterioration and he does not want his brother to suffer.  His brother wants him to be more comfortable and wishes to take him with hospice.  End of life care order set placed.   Code Status: Do not attempt resuscitation (DNR) - Comfort care   Total time spent for advanced care planning .

## 2023-03-20 NOTE — Progress Notes (Signed)
                                                     Palliative Care Progress Note, Assessment & Plan   Patient Name: Drew Russell       Date: 03/20/2023 DOB: [birth year]  Age: 78 y.o. MRN#: [id number] Attending Physician: Marcelino Duster, MD Primary Care Physician: Larae Grooms, NP Admit Date: 03/04/2023  Subjective: Patient is lying in bed, leaning to his right side.  He is asleep but easily awakens to my presence.  I offered to readjust patient and offered a pillow.  He shook his first finger at me and said "no" repeatedly.  He is not agitated or angry.  He just wishes for me to leave him alone.  No family or friends present during my visit.  HPI: 78 y.o. male  with past medical history of dementia, HTN and HLD admitted from home on 03/04/2023 s/p fall.   Found to have elevated CK and COVID positive CT head (-) and CXR (-)   Admitted and being treated for rhabdomyolysis, COVID, encephalopathy and hypotension CK levels trended down   Course has been complicated by ongoing encephalopathy without significant improvement, minimal PO intake contributing to hypernatremia   Palliative medicine was consulted for assisting with goals of care conversations  Summary of counseling/coordination of care: Extensive chart review completed prior to meeting patient including labs, vital signs, imaging, progress notes, orders, and available advanced directive documents from current and previous encounters.   After reviewing the patient's chart and assessing the patient at bedside, I spoke with dayshift RN in regards to symptom management and plan of care.  She endorses patient is comfortable.  Adjustment to medication regimen needed at this time.  As per chart review, attending has spoken with patient's brother.  Plan has shifted to be focused on full comfort  measures at this time. TOC following closely for discharge planning.  Plan remains for patient to discharge home with hospice services to follow.  Review of MAR.  Minor adjustments made to reflect full comfort measures.  PMT will continue to follow and support patient and family throughout his hospitalization.  Physical Exam Vitals reviewed.  Constitutional:      General: He is not in acute distress.    Appearance: He is normal weight.  HENT:     Head: Normocephalic.     Mouth/Throat:     Mouth: Mucous membranes are moist.  Eyes:     Pupils: Pupils are equal, round, and reactive to light.  Pulmonary:     Effort: Pulmonary effort is normal.  Abdominal:     Palpations: Abdomen is soft.  Skin:    General: Skin is warm and dry.  Neurological:     Mental Status: He is alert.  Psychiatric:        Behavior: Behavior normal.        Judgment: Judgment normal.             Total Time 25 minutes   Time spent includes: Detailed review of medical records (labs, imaging, vital signs), medically appropriate exam (mental status, respiratory, cardiac, skin), discussed with treatment team, counseling and educating patient, family and staff, documenting clinical information, medication management and coordination of care.  Samara Deist L. Bonita Quin, DNP, FNP-BC Palliative Medicine Team

## 2023-03-21 DIAGNOSIS — E876 Hypokalemia: Secondary | ICD-10-CM | POA: Diagnosis not present

## 2023-03-21 DIAGNOSIS — M6282 Rhabdomyolysis: Secondary | ICD-10-CM | POA: Diagnosis not present

## 2023-03-21 DIAGNOSIS — H66002 Acute suppurative otitis media without spontaneous rupture of ear drum, left ear: Secondary | ICD-10-CM | POA: Diagnosis not present

## 2023-03-21 DIAGNOSIS — R630 Anorexia: Secondary | ICD-10-CM | POA: Diagnosis not present

## 2023-03-21 LAB — GLUCOSE, CAPILLARY
Glucose-Capillary: 65 mg/dL — ABNORMAL LOW (ref 70–99)
Glucose-Capillary: 56 mg/dL — ABNORMAL LOW (ref 70–99)

## 2023-03-21 MED ORDER — MORPHINE SULFATE (CONCENTRATE) 20 MG/ML PO SOLN: 10.0000 mg | ORAL | 0 refills | Status: DC | PRN

## 2023-03-21 MED ORDER — PROCHLORPERAZINE MALEATE 10 MG PO TABS: 10.0000 mg | ORAL_TABLET | ORAL | 0 refills | Status: DC | PRN

## 2023-03-21 MED ORDER — LORAZEPAM 0.5 MG PO TABS: 0.5000 mg | ORAL_TABLET | ORAL | 0 refills | Status: DC | PRN

## 2023-03-21 MED ORDER — SENNOSIDES 8.6 MG PO TABS: 2.0000 | ORAL_TABLET | Freq: Two times a day (BID) | ORAL | 0 refills | Status: DC

## 2023-03-21 NOTE — Progress Notes (Addendum)
[facility], Room 159 [facility] Liaison Note   Received request from Ashby Dawes, RN , Transitions of Care Manager, for hospice services at home after discharge. Spoke with patient's brother to initiate education related to hospice philosophy, services, and team approach to care. Brother verbalized understanding of information given.  DME needs discussed. Patient has the following equipment in the home:  None  Patient/family requests the following equipment in the home: Hospital bed, over the bed table and [employer].  The address has been verified and is correct in the chart.  Merrily Pew and phone number [phone] is the family contact to arrange time of equipment delivery.   Please send signed and completed DNR home with the patient/family. Please provide prescriptions at discharge as needed to ensure ongoing symptom management.  [facility] information and contact numbers given to brother.  Above information shared with Michael Litter, RN, Transitions of Care Manager.  Please call with any Hospice related questions or concerns.  Thank you for the opportunity to participate in this patient's care.   Redge Gainer,  [facility] Liaison  [phone]

## 2023-03-21 NOTE — Progress Notes (Signed)
Palliative Care Progress Note, Assessment & Plan   Patient Name: Drew Russell       Date: 03/21/2023 DOB: Feb 26, 1945  Age: 78 y.o. MRN#: [id number] Attending Physician: Marcelino Duster, MD Primary Care Physician: Larae Grooms, NP Admit Date: 03/04/2023  Subjective: Patient is sitting up in bed, awake and alert, in no apparent distress.  He acknowledges my presence and is able to make his wishes known.  He politely declines my physical examination.  I can note that respirations are even and unlabored.  Signs of distress not noted.  No family or friends present during my visit.  HPI: 78 y.o. male  with past medical history of dementia, HTN and HLD admitted from home on 03/04/2023 s/p fall.   Found to have elevated CK and COVID positive CT head (-) and CXR (-)   Admitted and being treated for rhabdomyolysis, COVID, encephalopathy and hypotension CK levels trended down   Course has been complicated by ongoing encephalopathy without significant improvement, minimal PO intake contributing to hypernatremia   Palliative medicine was consulted for assisting with goals of care conversations  Summary of counseling/coordination of care: Extensive chart review completed prior to meeting patient including labs, vital signs, imaging, progress notes, orders, and available advanced directive documents from current and previous encounters.   After reviewing the patient's chart and assessing the patient at bedside, I spoke with patient in regards to symptom management and goals of care.   When asked if he is in pain, patient says "I did not touch anything on that tray I promise".  I attempted to reorient patient to current situation.  He shook his finger no at me and said that he had done nothing wrong.   Patient unable to participate in symptom assessment at this time.  Signs of distress not noted.  No adjustment to [facility] needed at this time.  As per chart review, patient has discharge summary in place.  Plan is to transfer home with hospice services to follow.  No further palliative needs at this time.  Please reengage with PMT if goals change, at patient/family's request, or if patient's health deteriorates during this hospitalization.  Physical Exam Vitals reviewed.  Constitutional:      General: He is not in acute distress.    Appearance: He is normal weight.  HENT:     Head: Normocephalic.     Mouth/Throat:     Mouth: Mucous membranes are moist.  Eyes:     Pupils: Pupils are equal, round, and reactive to light.  Pulmonary:     Effort: Pulmonary effort is normal.  Abdominal:     Palpations: Abdomen is soft.  Musculoskeletal:     Comments: Generalized weakness  Skin:    General: Skin is warm and dry.  Neurological:     Mental Status: He is alert.  Psychiatric:        Mood and Affect: Mood normal.        Behavior: Behavior normal.             Total Time 25 minutes   Time spent includes: Detailed review of medical records (labs, imaging, vital signs), medically appropriate exam (mental status, respiratory, cardiac, skin), discussed with  treatment team, counseling and educating patient, family and staff, documenting clinical information, medication management and coordination of care.  Samara Deist L. Bonita Quin, DNP, FNP-BC Palliative Medicine Team

## 2023-03-21 NOTE — Progress Notes (Signed)
Hypoglycemic Event  CBG: 56  Treatment: 4 oz juice/soda. Patient unable to take patient lethargic  Symptoms:  UTA  Follow-up CBG: Time: CBG testing DC, patient on comfort care.  Possible Reasons for Event: Other: comfort care lethargic, not able to eat or drink.  Comments/MD notified: MD notified. Advised to give patient juice. Patient lethargic, unable to take juice. No other intervention taken due to patient on comfort care/hospice.     Drew Russell

## 2023-03-21 NOTE — Discharge Summary (Signed)
Physician Discharge Summary   Patient: Drew Russell MRN: [id number] DOB: Jan 11, 1946  Admit date:     03/04/2023  Discharge date: 03/21/23  Discharge Physician: Marcelino Duster   PCP: Larae Grooms, NP   Recommendations at discharge:    Home with hospice discharge  Discharge Diagnoses: Principal Problem:   Non-traumatic rhabdomyolysis Active Problems:   Acute exudative otitis media of left ear   Hypokalemia   Anorexia   COVID-19 virus infection   Hypotension due to hypovolemia   Dyslipidemia   Acute metabolic encephalopathy   CKD stage 3a, GFR 45-59 ml/min (HCC) - baseline scR 1.4-1.6   Anemia in chronic kidney disease (CKD)   Pancytopenia (HCC)   Protein-calorie malnutrition, severe   Acute renal failure superimposed on stage 3a chronic kidney disease (HCC)   ACP (advance care planning)   Hospice care patient  Resolved Problems:   * No resolved hospital problems. [facility] Course: Drew Russell is a 78 y.o. Caucasian male with medical history significant for hypertension and dyslipidemia as well as dementia, presented to the emergency room with acute onset of altered mental status and fall.  Apparently fell at home yesterday and his brother was feeding him while he was on the floor waiting for him to get enough energy to get up.  He denied any chest pain or palpitations earlier.  No cough or wheezing or dyspnea.  No neck pain or stiffness.  No fever or chills.  He depleted and dehydrated with cracked lips and dry mucous membranes.  he was very somnolent during my interview and only responsive to painful stimuli and therefore no further history could be obtained.    During hospital stay, his CK level, potassium improved, patient is 10 days past COVID virus infection, not hypoxic.  Patient seems to be confused at baseline and does not wish to eat or get out of bed.  Patient is severely malnourished with moderate fat depletion, weight loss.  Patient's brother  understands poor prognosis agreed with comfort care only, wishes to take him home with hospice.  Assessment and Plan: * Non-traumatic rhabdomyolysis Acute metabolic encephalopathy Poor oral intake, anorexia Severe protein calorie malnutrition COVID-19 virus infection. Hyponatremia. Acute on chronic kidney disease stage III. Hypotension due to hypovolemia. Pancytopenia. Anemia of chronic disease After prolonged discussion with patient's brother at bedside, patient is made DNR comfort care only.  Patient's brother understands his current clinical condition and poor prognosis.  He reported that their mother had similar clinical deterioration.  Brother does not want patient to suffer and agreed with home with hospice upon discharge. Hospice services are set up and DME equipment will be delivered today at 3 PM. Discharge orders placed with scripts for morphine and Ativan printed out.       Consultants: None Procedures performed: None Disposition:  Home with hospice as Diet recommendation:  Discharge Diet Orders (From admission, onward)     Start     Ordered   03/21/23 0000  Diet - low sodium heart healthy       Pleasure feeds 03/21/23 1205           Pleasure feeds DISCHARGE MEDICATION: Allergies as of 03/21/2023   No Known Allergies      Medication List     STOP taking these medications    amLODipine 10 MG tablet Commonly known as: NORVASC   atorvastatin 20 MG tablet Commonly known as: LIPITOR   Fish Oil 1000 MG Caps  TAKE these medications    LORazepam 0.5 MG tablet Commonly known as: ATIVAN Take 1 tablet (0.5 mg total) by mouth every 4 (four) hours as needed for anxiety. May crush, mix with water and give sublingually if needed.   morphine 20 MG/ML concentrated solution Commonly known as: ROXANOL Take 0.5 mLs (10 mg total) by mouth every 4 (four) hours as needed for severe pain (pain score 7-10). May give sublingually if needed.   prochlorperazine  10 MG tablet Commonly known as: COMPAZINE Take 1 tablet (10 mg total) by mouth every 4 (four) hours as needed for nausea or vomiting. May crush, mix with water and give sublingually.   senna 8.6 MG tablet Commonly known as: Senokot Take 2 tablets (17.2 mg total) by mouth 2 (two) times daily. May crush, mix with water and give sublingually if needed.        Discharge Exam: Filed Weights   03/05/23 0023  Weight: 59 kg   General - Elderly ill and weak Caucasian male, sleeping, no distress. HEENT - PERRLA, EOMI, atraumatic head, non tender sinuses. Lung - Clear, basal rales, rhonchi, no wheezes. Heart - S1, S2 heard, no murmurs, rubs, trace pedal edema. Abdomen - Soft, non tender, bowel sounds good Neuro -sleeping, does not follow commands, unable to do full neuro exam. Skin - Warm and dry.  Condition at discharge: poor  The results of significant diagnostics from this hospitalization (including imaging, microbiology, ancillary and laboratory) are listed below for reference.   Imaging Studies: DG Chest Port 1 View Result Date: 03/05/2023 CLINICAL DATA:  Altered mental status with question of sepsis. EXAM: PORTABLE CHEST 1 VIEW COMPARISON:  None Available. FINDINGS: The heart size and mediastinal contours are within normal limits. There is moderate severity calcification of the aortic arch. The lungs are hyperinflated. There is no evidence of an acute infiltrate, pleural effusion or pneumothorax. Multilevel degenerative changes seen throughout the thoracic spine. IMPRESSION: No active cardiopulmonary disease. Electronically Signed   By: Aram Candela M.D.   On: 03/05/2023 00:35   CT Head Wo Contrast Result Date: 03/04/2023 CLINICAL DATA:  Head trauma, minor (Age >= 65y) fall EXAM: CT HEAD WITHOUT CONTRAST TECHNIQUE: Contiguous axial images were obtained from the base of the skull through the vertex without intravenous contrast. RADIATION DOSE REDUCTION: This exam was performed  according to the departmental dose-optimization program which includes automated exposure control, adjustment of the mA and/or kV according to patient size and/or use of iterative reconstruction technique. COMPARISON:  None Available. FINDINGS: Brain: Cerebral ventricle sizes are concordant with the degree of cerebral volume loss. Patchy and confluent areas of decreased attenuation are noted throughout the deep and periventricular white matter of the cerebral hemispheres bilaterally, compatible with chronic microvascular ischemic disease. No evidence of large-territorial acute infarction. No parenchymal hemorrhage. No mass lesion. No extra-axial collection. No mass effect or midline shift. No hydrocephalus. Basilar cisterns are patent. Vascular: No hyperdense vessel. Atherosclerotic calcifications are present within the cavernous internal carotid arteries. Skull: No acute fracture or focal lesion. Sinuses/Orbits: Paranasal sinuses and mastoid air cells are clear. Bilateral lens replacement. Otherwise the orbits are unremarkable. Other: None. IMPRESSION: No acute intracranial abnormality. Electronically Signed   By: Tish Frederickson M.D.   On: 03/04/2023 18:48    Microbiology: Results for orders placed or performed during the hospital encounter of 03/04/23  Blood Culture (routine x 2)     Status: None   Collection Time: 03/04/23 11:51 PM   Specimen: BLOOD  Result Value Ref  Range Status   Specimen Description BLOOD LEFT ARM  Final   Special Requests   Final    BOTTLES DRAWN AEROBIC AND ANAEROBIC Blood Culture results may not be optimal due to an inadequate volume of blood received in culture bottles   Culture   Final    NO GROWTH 5 DAYS Performed at [facility], [address], [place], [place] [address]    Report Status 03/10/2023 FINAL  Final  Blood Culture (routine x 2)     Status: None   Collection Time: 03/05/23 12:17 AM   Specimen: BLOOD  Result Value Ref Range Status   Specimen  Description BLOOD LEFT FOREARM  Final   Special Requests   Final    BOTTLES DRAWN AEROBIC AND ANAEROBIC Blood Culture adequate volume   Culture   Final    NO GROWTH 5 DAYS Performed at [facility], [address] [place], [place] [address]    Report Status 03/10/2023 FINAL  Final  Resp panel by RT-PCR (RSV, Flu A&B, Covid) Anterior Nasal Swab     Status: Abnormal   Collection Time: 03/05/23 12:17 AM   Specimen: Anterior Nasal Swab  Result Value Ref Range Status   SARS Coronavirus 2 by RT PCR POSITIVE (A) NEGATIVE Final    Comment: (NOTE) SARS-CoV-2 target nucleic acids are DETECTED.  The SARS-CoV-2 RNA is generally detectable in upper respiratory specimens during the acute phase of infection. Positive results are indicative of the presence of the identified virus, but do not rule out bacterial infection or co-infection with other pathogens not detected by the test. Clinical correlation with patient history and other diagnostic information is necessary to determine patient infection status. The expected result is Negative.  Fact Sheet for Patients: BloggerCourse.com  Fact Sheet for Healthcare Providers: SeriousBroker.it  This test is not yet approved or cleared by the [place] FDA and  has been authorized for detection and/or diagnosis of SARS-CoV-2 by FDA under an Emergency Use Authorization (EUA).  This EUA will remain in effect (meaning this test can be used) for the duration of  the COVID-19 declaration under Section 564(b)(1) of the A ct, 21 U.S.C. section 360bbb-3(b)(1), unless the authorization is terminated or revoked sooner.     Influenza A by PCR NEGATIVE NEGATIVE Final   Influenza B by PCR NEGATIVE NEGATIVE Final    Comment: (NOTE) The Xpert Xpress SARS-CoV-2/FLU/RSV plus assay is intended as an aid in the diagnosis of influenza from Nasopharyngeal swab specimens and should not be used as a sole  basis for treatment. Nasal washings and aspirates are unacceptable for Xpert Xpress SARS-CoV-2/FLU/RSV testing.  Fact Sheet for Patients: BloggerCourse.com  Fact Sheet for Healthcare Providers: SeriousBroker.it  This test is not yet approved or cleared by the [place] FDA and has been authorized for detection and/or diagnosis of SARS-CoV-2 by FDA under an Emergency Use Authorization (EUA). This EUA will remain in effect (meaning this test can be used) for the duration of the COVID-19 declaration under Section 564(b)(1) of the Act, 21 U.S.C. section 360bbb-3(b)(1), unless the authorization is terminated or revoked.     Resp Syncytial Virus by PCR NEGATIVE NEGATIVE Final    Comment: (NOTE) Fact Sheet for Patients: BloggerCourse.com  Fact Sheet for Healthcare Providers: SeriousBroker.it  This test is not yet approved or cleared by the [place] FDA and has been authorized for detection and/or diagnosis of SARS-CoV-2 by FDA under an Emergency Use Authorization (EUA). This EUA will remain in effect (meaning this  test can be used) for the duration of the COVID-19 declaration under Section 564(b)(1) of the Act, 21 U.S.C. section 360bbb-3(b)(1), unless the authorization is terminated or revoked.  Performed at [facility], [address] [place], [place] [address]     Labs: CBC: Recent Labs  Lab 03/18/23 0529  WBC 6.1  HGB 10.4*  HCT 31.7*  MCV 99.1  PLT 232   Basic Metabolic Panel: Recent Labs  Lab 03/16/23 0446 03/17/23 0454 03/18/23 0529 03/19/23 0436 03/20/23 0506  NA 160* 155* 161* 158* 159*  K 3.8 3.1* 3.9 3.7 3.7  CL 121* 121* 125* 124* 124*  CO2 27 27 29 28 27   GLUCOSE 106* 129* 114* 124* 93  BUN 57* 52* 50* 54* 53*  CREATININE 1.77* 1.71* 1.77* 2.06* 2.03*  CALCIUM 8.5* 8.0* 8.4* 8.2* 8.0*  MG 2.8* 2.8* 3.3*  --   --   PHOS 3.2 2.7  2.3* 2.9 3.1   Liver Function Tests: Recent Labs  Lab 03/16/23 0446  ALBUMIN 3.1*   CBG: Recent Labs  Lab 03/19/23 1820 03/20/23 0211 03/20/23 0629 03/20/23 2307 03/21/23 1147  GLUCAP 96 88 84 65* 56*    Discharge time spent: 36 minutes.  Signed: Marcelino Duster, MD [facility] 03/21/2023

## 2023-03-21 NOTE — TOC Progression Note (Signed)
Transition of Care [facility]) - Progression Note    Patient Details  Name: Drew Russell MRN: [id number] Date of Birth: Nov 21, 1945  Transition of Care [facility]) CM/SW Contact  Marlowe Sax, RN Phone Number: 03/21/2023, 1:35 PM  Clinical Narrative:    Called EMS to arrange transport for 4 PM once DME is delivered at 3, [facility] scheduled to see the patient        Expected Discharge Plan and Services         Expected Discharge Date: 03/21/23                                     Social Determinants of Health (SDOH) Interventions SDOH Screenings   Food Insecurity: Patient Unable To Answer (03/08/2023)  Housing: Patient Unable To Answer (03/08/2023)  Transportation Needs: Patient Unable To Answer (03/08/2023)  Utilities: Patient Unable To Answer (03/08/2023)  Alcohol Screen: Low Risk  (08/08/2021)  Depression (PHQ2-9): Low Risk  (12/03/2022)  Financial Resource Strain: Low Risk  (08/08/2021)  Physical Activity: Insufficiently Active (08/08/2021)  Social Connections: Unknown (03/08/2023)  Stress: No Stress Concern Present (08/08/2021)  Tobacco Use: Medium Risk (12/03/2022)    Readmission Risk Interventions     No data to display

## 2023-03-22 ENCOUNTER — Telehealth: Payer: Self-pay

## 2023-03-22 NOTE — Transitions of Care (Post Inpatient/ED Visit) (Signed)
03/22/2023  Name: Drew Russell MRN: [id number] DOB: 24-Apr-1945  Today's TOC FU Call Status: Today's TOC FU Call Status:: Successful TOC FU Call Completed TOC FU Call Complete Date: 03/22/23 Patient's Name and Date of Birth confirmed.  Transition Care Management Follow-up Telephone Call Date of Discharge: 03/21/23 Discharge Facility: [facility]) Type of Discharge: Inpatient Admission Primary Inpatient Discharge Diagnosis:: dementia, presented to the emergency room with acute onset of altered mental status and fall Non-traumatic rhabdomyolysis How have you been since you were released from the hospital?: Same Any questions or concerns?: No  Items Reviewed: Did you receive and understand the discharge instructions provided?: Yes Medications obtained,verified, and reconciled?: Yes (Medications Reviewed) (Medication reconciliation completed based on recent discharge summary Patient taking medications as instructed and is aware of any changes or dosage adjustments medication regimen. Patient denies questions and reports no barriers to medication adherence) Any new allergies since your discharge?: No Dietary orders reviewed?: Yes Type of Diet Ordered:: Pleasure meals Do you have support at home?: Yes People in Home: sibling(s) Name of Support/Comfort Primary Source: Brother Richard  Medications Reviewed Today: Medications Reviewed Today     Reviewed by Johnnette Barrios, RN (Registered Nurse) on 03/22/23 at 1158  Med List Status: <None>   Medication Order Taking? Sig Documenting Provider Last Dose Status Informant  LORazepam (ATIVAN) 0.5 MG tablet [id number] Yes Take 1 tablet (0.5 mg total) by mouth every 4 (four) hours as needed for anxiety. May crush, mix with water and give sublingually if needed. Marcelino Duster, MD Taking Active   morphine (ROXANOL) 20 MG/ML concentrated solution [id number] Yes Take 0.5 mLs (10 mg total) by mouth every 4 (four) hours  as needed for severe pain (pain score 7-10). May give sublingually if needed. Marcelino Duster, MD Taking Active   prochlorperazine (COMPAZINE) 10 MG tablet [id number] Yes Take 1 tablet (10 mg total) by mouth every 4 (four) hours as needed for nausea or vomiting. May crush, mix with water and give sublingually. Marcelino Duster, MD Taking Active   senna (SENOKOT) 8.6 MG tablet [id number] Yes Take 2 tablets (17.2 mg total) by mouth 2 (two) times daily. May crush, mix with water and give sublingually if needed. Marcelino Duster, MD Taking Active           Medication reconciliation / review completed based on most recent discharge summary and EHR medication list. Confirmed patient is taking all newly prescribed medications as instructed (any discrepancies are noted in review section)   Patient / Caregiver is aware of any changes to and / or  any dosage adjustments to medication regimen. Patient/ Caregiver denies questions at this time and reports no barriers to medication adherence  Home Care and Equipment/Supplies: Were Home Health Services Ordered?: Yes Name of Home Health Agency:: [facility] [phone] Has Agency set up a time to come to your home?: Yes First Home Health Visit Date: 03/22/23 Any new equipment or medical supplies ordered?: Yes Name of Medical supply agency?: Bed, w/c OTB table Were you able to get the equipment/medical supplies?: Yes Do you have any questions related to the use of the equipment/supplies?: No  Functional Questionnaire: Do you need assistance with bathing/showering or dressing?: Yes Do you need assistance with meal preparation?: Yes Do you need assistance with eating?: Yes (needs to be fed) Do you have difficulty maintaining continence: Yes Do you need assistance with getting out of bed/getting out of a chair/moving?: Yes (non ambulatory) Do you have difficulty managing or taking  your medications?: Yes (brother administers meds)  Follow up  appointments reviewed: PCP Follow-up appointment confirmed?: NA (Hospice will manage care at this time) Specialist Hospital Follow-up appointment confirmed?: NA Do you need transportation to your follow-up appointment?: No Do you understand care options if your condition(s) worsen?: Yes-patient verbalized understanding  SDOH Interventions Today    Flowsheet Row Most Recent Value  SDOH Interventions   Food Insecurity Interventions Intervention Not Indicated  [Brother stated they have pleanty of food resources]  Housing Interventions Intervention Not Indicated  Transportation Interventions Intervention Not Indicated, Patient Resources (Friends/Family)  [Patient is under Hospice Care is non-ambulatory and appointmets have been deferred to [facility]]  Utilities Interventions Intervention Not Indicated  Social Connections Interventions Intervention Not Indicated  [Patient has Dementia, Dysphagia and Aphasia. He lives with his brother There is limited social contact Per brother it is difficult to understand him]      Interventions Today    Flowsheet Row Most Recent Value  General Interventions   General Interventions Discussed/Reviewed General Interventions Discussed, Doctor Visits  Doctor Visits Discussed/Reviewed PCP, Doctor Visits Reviewed  Exercise Interventions   Exercise Discussed/Reviewed Physical Activity  Nutrition Interventions   Nutrition Discussed/Reviewed Nutrition Discussed  Pharmacy Interventions   Pharmacy Dicussed/Reviewed Medications and their functions        Benefits reviewed  Based on current information and Insurance plan -Reviewed benefits accessible to patient, including details about eligibility options for care and  available value based care options  if any areas of needs were identified.  Reviewed patient/  caregiver's ability to access and / or  ability with navigating the benefits system..Amb Referral made if indicted , refer to orders section of note for  details     [facility] program  Patient is at high risk for readmission and/or has history of  high utilization  Discussed VBCI  TOC program and weekly calls to patient to assess condition/status, medication management  and provide support/education as indicated . Patient/ Caregiver voiced understanding and declined enrollment in the 30-day TOC Program.    He is followed by [facility] This was initiated in the Hospital. He has his equipment and his brother is picking up medications today. Hospice Nurse visit scheduled for this afternoon   The patient has been provided with contact information for the care management team and has been advised to call with any health-related questions or concerns. Follow up as indicated with Care Team , or sooner should any new problems arise.     Susa Loffler , BSN, RN [facility]   VBCI-Population Health RN Care Manager Direct Dial [phone]  Fax: [phone] Website: [email or web address]

## 2023-03-25 DIAGNOSIS — R404 Transient alteration of awareness: Secondary | ICD-10-CM | POA: Diagnosis not present

## 2023-03-25 DIAGNOSIS — Z743 Need for continuous supervision: Secondary | ICD-10-CM | POA: Diagnosis not present

## 2023-04-20 DEATH — deceased

## 2023-12-10 IMAGING — CT CT CHEST LUNG CANCER SCREENING LOW DOSE W/O CM
2 of 5 series · 15 of 40 positions shown, 18 images · non-contrast
Comparison: 05/06/[address].

CLINICAL DATA: 75-year-old asymptomatic male former smoker with 50
pack-year smoking history, quit smoking 8 years prior.



[Series 3: lung 1.00 · axial · 0.65mm/px · z∈[-1194,-881]mm · 12 of 345 slices shown, 15 images]
[im 16/345  mediastinal]
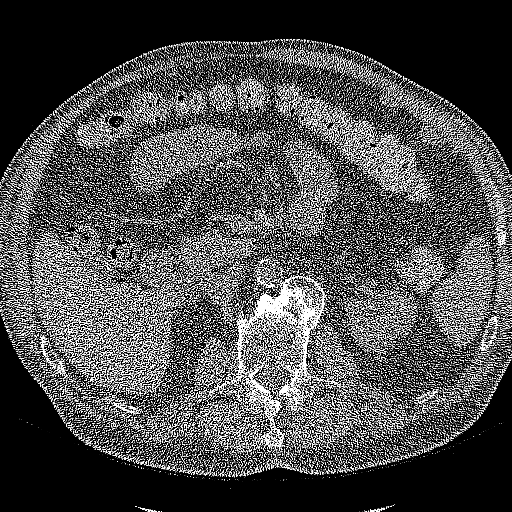
[im 16/345  lung]
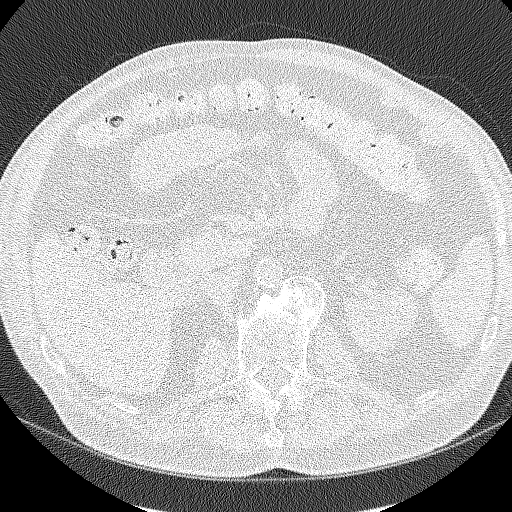
[im 47/345  lung]
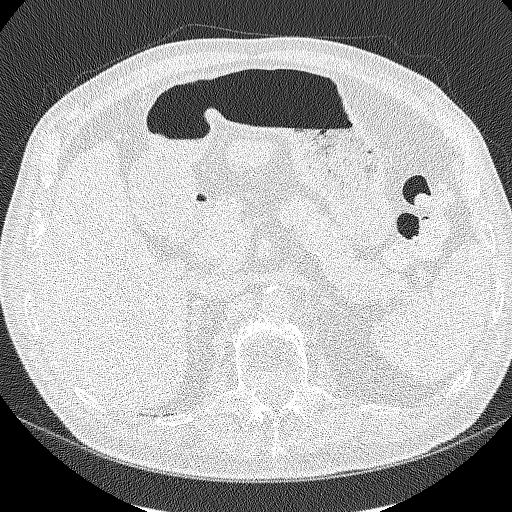
[im 79/345  lung]
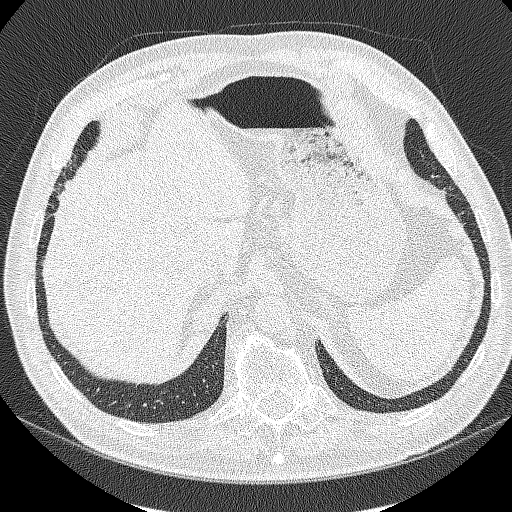
[im 110/345  lung]
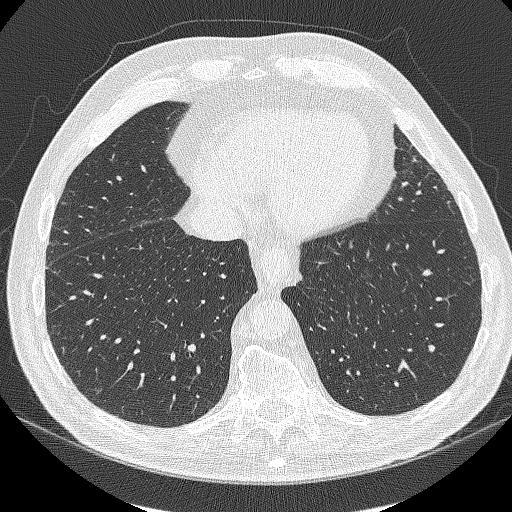
[im 126/345  mediastinal]
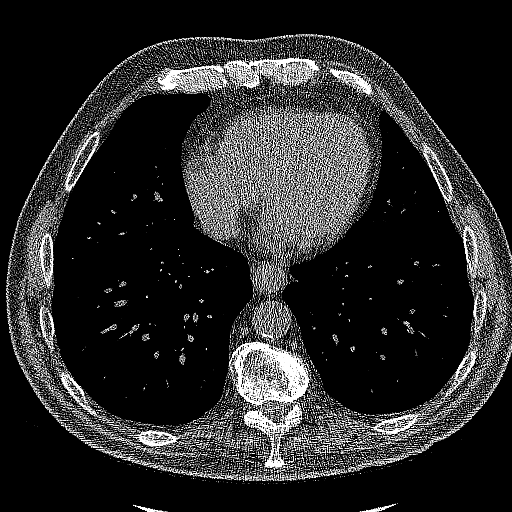
[im 126/345  lung]
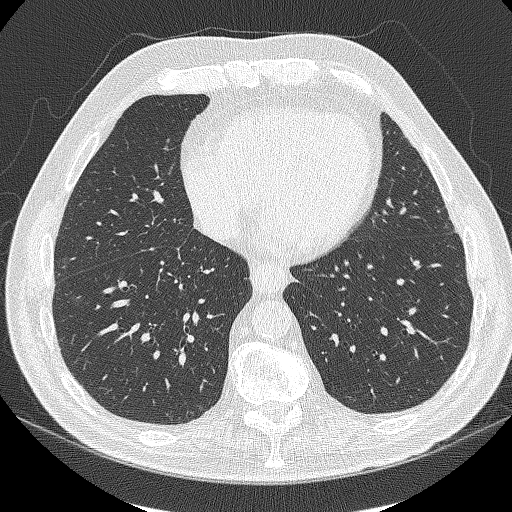
[im 157/345  lung]
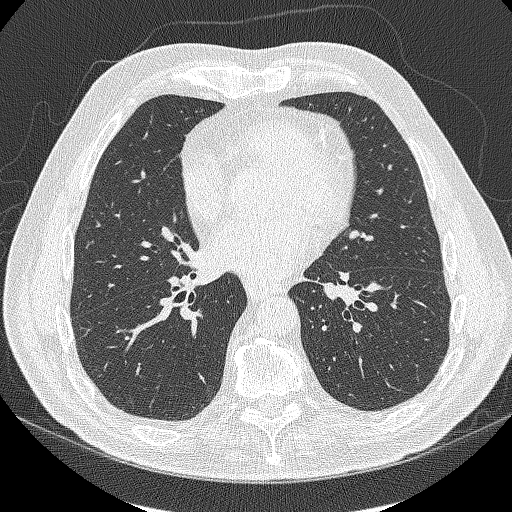
[im 188/345  lung]
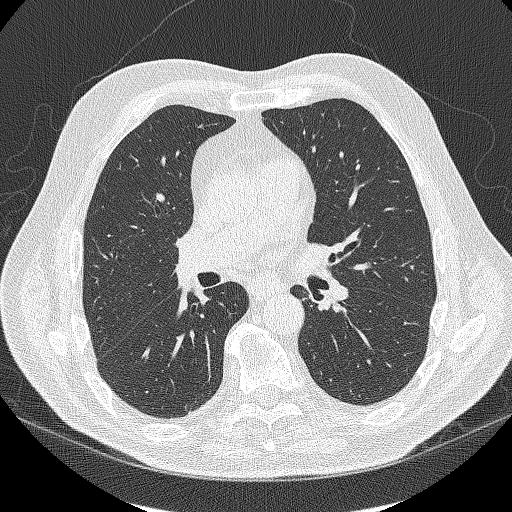
[im 219/345  lung]
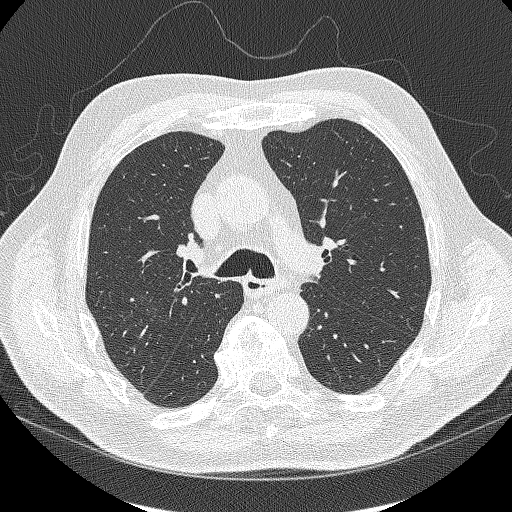
[im 235/345  mediastinal]
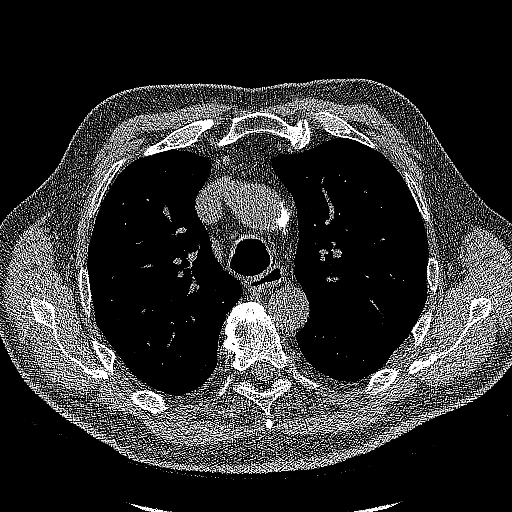
[im 235/345  lung]
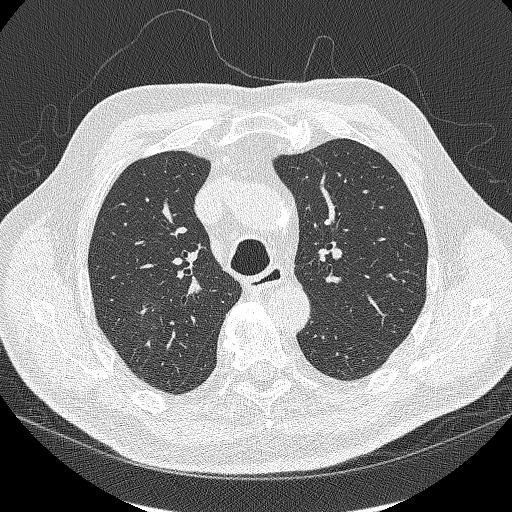
[im 266/345  lung]
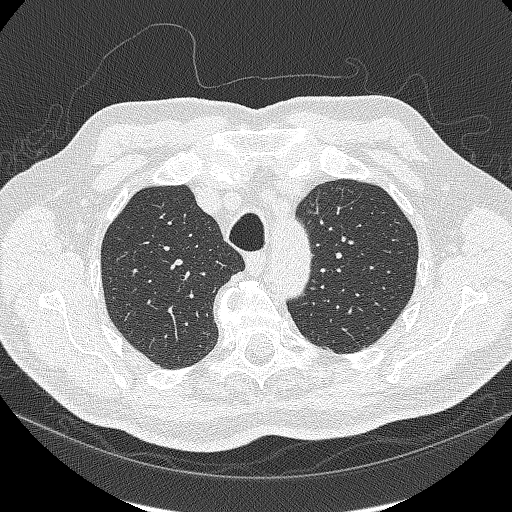
[im 298/345  lung]
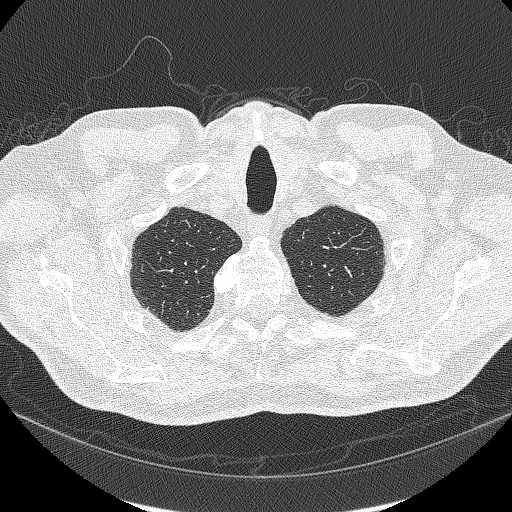
[im 329/345  lung]
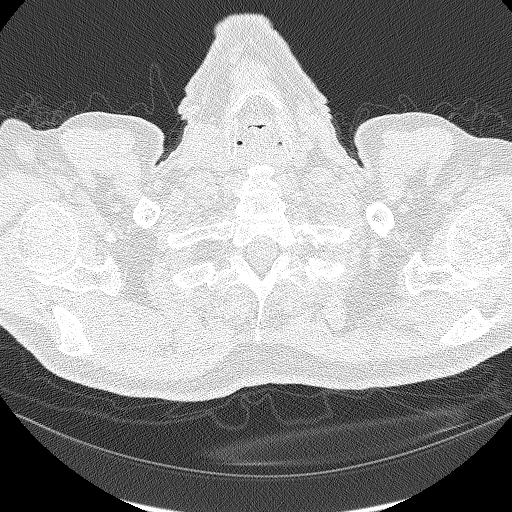

[Series 5: coronals lung 1.00 cor · coronal · 0.65mm/px · 3 of 294 slices shown]
[im 59/294  lung]
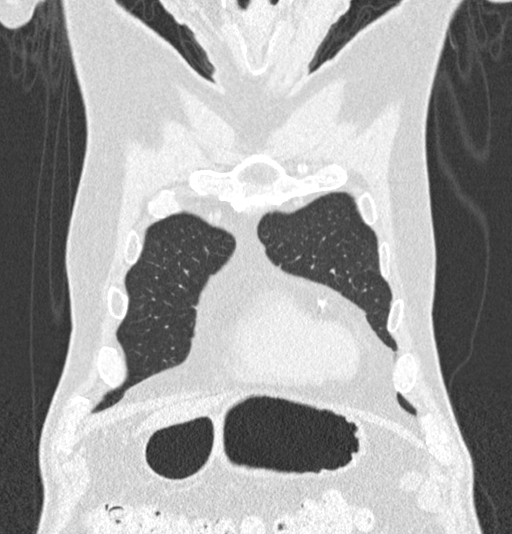
[im 118/294  lung]
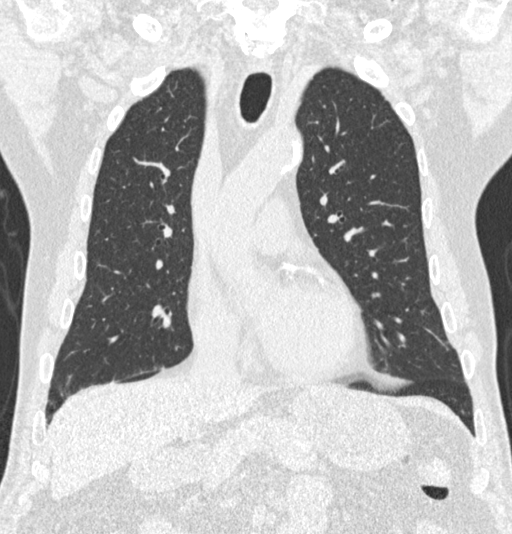
[im 176/294  lung]
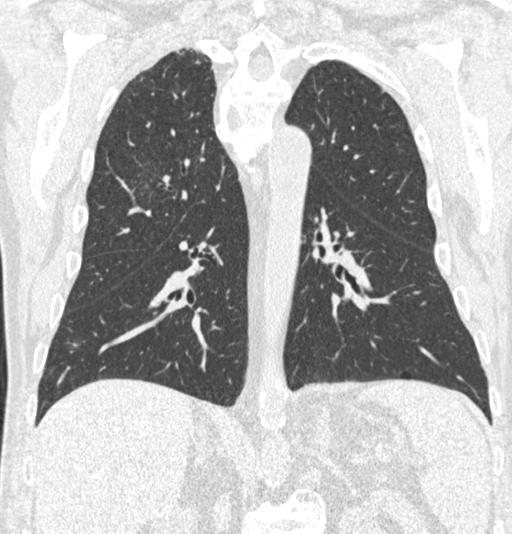

[15 of 40 positions shown; findings below may reference images not displayed]

FINDINGS: Cardiovascular: Normal heart size. No significant pericardial
effusion/thickening. Three-vessel coronary atherosclerosis.
Atherosclerotic nonaneurysmal thoracic aorta. Normal caliber
pulmonary arteries.

Mediastinum/Nodes: No discrete thyroid nodules. Unremarkable
esophagus. No pathologically enlarged axillary, mediastinal or hilar
lymph nodes, noting limited sensitivity for the detection of hilar
adenopathy on this noncontrast study.

Lungs/Pleura: No pneumothorax. No pleural effusion. Mild paraseptal
and centrilobular emphysema with diffuse bronchial wall thickening.
No acute consolidative airspace disease or lung masses. No
significant growth of previously visualized pulmonary nodules. New
tiny indistinct right upper lobe pulmonary nodule measuring 2.1 mm
in volume derived mean diameter with surrounding ground-glass
opacity (series 3/image 105), probably inflammatory. No additional
new significant pulmonary nodules.

Upper abdomen: No acute abnormality.

Musculoskeletal: No aggressive appearing focal osseous lesions.
Marked thoracic spondylosis.
IMPRESSION: 1. Lung-RADS 2, benign appearance or behavior. Continue annual
screening with low-dose chest CT without contrast in 12 months.
2. Three-vessel coronary atherosclerosis.
3. Aortic Atherosclerosis ([id number]) and Emphysema ([id number].1).
